# Patient Record
Sex: Female | Born: 1975 | Race: Black or African American | Hispanic: No | Marital: Single | Smoking: Never smoker
Health system: Southern US, Community
[De-identification: ages and names within clinical notes are randomized; demographics above are authoritative.]

## PROBLEM LIST (undated history)

## (undated) DIAGNOSIS — IMO0002 Reserved for concepts with insufficient information to code with codable children: Secondary | ICD-10-CM

## (undated) DIAGNOSIS — A749 Chlamydial infection, unspecified: Secondary | ICD-10-CM

## (undated) DIAGNOSIS — I10 Essential (primary) hypertension: Secondary | ICD-10-CM

## (undated) DIAGNOSIS — D219 Benign neoplasm of connective and other soft tissue, unspecified: Secondary | ICD-10-CM

## (undated) DIAGNOSIS — D649 Anemia, unspecified: Secondary | ICD-10-CM

## (undated) DIAGNOSIS — N920 Excessive and frequent menstruation with regular cycle: Secondary | ICD-10-CM

## (undated) DIAGNOSIS — K219 Gastro-esophageal reflux disease without esophagitis: Secondary | ICD-10-CM

## (undated) HISTORY — DX: Reserved for concepts with insufficient information to code with codable children: IMO0002

## (undated) HISTORY — DX: Essential (primary) hypertension: I10

## (undated) HISTORY — PX: OTHER SURGICAL HISTORY: SHX169

---
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
## 1997-05-10 HISTORY — PX: DILATION AND CURETTAGE OF UTERUS: SHX78

## 1997-12-12 ENCOUNTER — Inpatient Hospital Stay (HOSPITAL_COMMUNITY): Admission: AD | Admit: 1997-12-12 | Discharge: 1997-12-12 | Payer: Self-pay | Admitting: Obstetrics and Gynecology

## 1998-01-10 ENCOUNTER — Inpatient Hospital Stay (HOSPITAL_COMMUNITY): Admission: AD | Admit: 1998-01-10 | Discharge: 1998-01-13 | Payer: Self-pay | Admitting: Obstetrics and Gynecology

## 1998-01-18 ENCOUNTER — Emergency Department (HOSPITAL_COMMUNITY): Admission: EM | Admit: 1998-01-18 | Discharge: 1998-01-18 | Payer: Self-pay | Admitting: Emergency Medicine

## 1998-02-14 ENCOUNTER — Emergency Department (HOSPITAL_COMMUNITY): Admission: EM | Admit: 1998-02-14 | Discharge: 1998-02-14 | Payer: Self-pay | Admitting: Emergency Medicine

## 1998-02-22 ENCOUNTER — Emergency Department (HOSPITAL_COMMUNITY): Admission: EM | Admit: 1998-02-22 | Discharge: 1998-02-22 | Payer: Self-pay | Admitting: Emergency Medicine

## 1998-02-26 ENCOUNTER — Other Ambulatory Visit: Admission: RE | Admit: 1998-02-26 | Discharge: 1998-02-26 | Payer: Self-pay | Admitting: Obstetrics and Gynecology

## 1998-10-12 ENCOUNTER — Emergency Department (HOSPITAL_COMMUNITY): Admission: EM | Admit: 1998-10-12 | Discharge: 1998-10-13 | Payer: Self-pay | Admitting: Emergency Medicine

## 1999-01-17 ENCOUNTER — Emergency Department (HOSPITAL_COMMUNITY): Admission: EM | Admit: 1999-01-17 | Discharge: 1999-01-17 | Payer: Self-pay | Admitting: Emergency Medicine

## 1999-03-18 ENCOUNTER — Inpatient Hospital Stay (HOSPITAL_COMMUNITY): Admission: AD | Admit: 1999-03-18 | Discharge: 1999-03-18 | Payer: Self-pay | Admitting: Obstetrics

## 1999-08-31 ENCOUNTER — Emergency Department (HOSPITAL_COMMUNITY): Admission: EM | Admit: 1999-08-31 | Discharge: 1999-08-31 | Payer: Self-pay | Admitting: Emergency Medicine

## 1999-10-21 ENCOUNTER — Other Ambulatory Visit: Admission: RE | Admit: 1999-10-21 | Discharge: 1999-10-21 | Payer: Self-pay | Admitting: Obstetrics and Gynecology

## 1999-10-21 ENCOUNTER — Other Ambulatory Visit: Admission: RE | Admit: 1999-10-21 | Discharge: 1999-10-21 | Payer: Self-pay | Admitting: Unknown Physician Specialty

## 2000-04-09 ENCOUNTER — Inpatient Hospital Stay (HOSPITAL_COMMUNITY): Admission: AD | Admit: 2000-04-09 | Discharge: 2000-04-11 | Payer: Self-pay | Admitting: Obstetrics & Gynecology

## 2000-11-16 ENCOUNTER — Emergency Department (HOSPITAL_COMMUNITY): Admission: EM | Admit: 2000-11-16 | Discharge: 2000-11-16 | Payer: Self-pay | Admitting: Emergency Medicine

## 2000-11-18 ENCOUNTER — Emergency Department (HOSPITAL_COMMUNITY): Admission: EM | Admit: 2000-11-18 | Discharge: 2000-11-19 | Payer: Self-pay | Admitting: Emergency Medicine

## 2001-05-08 ENCOUNTER — Encounter: Admission: RE | Admit: 2001-05-08 | Discharge: 2001-05-08 | Payer: Self-pay

## 2001-10-05 ENCOUNTER — Encounter: Admission: RE | Admit: 2001-10-05 | Discharge: 2001-10-05 | Payer: Self-pay | Admitting: Internal Medicine

## 2002-02-14 ENCOUNTER — Encounter: Admission: RE | Admit: 2002-02-14 | Discharge: 2002-02-14 | Payer: Self-pay | Admitting: Internal Medicine

## 2003-03-29 ENCOUNTER — Encounter: Admission: RE | Admit: 2003-03-29 | Discharge: 2003-03-29 | Payer: Self-pay | Admitting: Internal Medicine

## 2004-08-22 ENCOUNTER — Emergency Department (HOSPITAL_COMMUNITY): Admission: EM | Admit: 2004-08-22 | Discharge: 2004-08-22 | Payer: Self-pay | Admitting: Emergency Medicine

## 2004-08-23 ENCOUNTER — Emergency Department (HOSPITAL_COMMUNITY): Admission: AD | Admit: 2004-08-23 | Discharge: 2004-08-23 | Payer: Self-pay | Admitting: Family Medicine

## 2005-10-29 ENCOUNTER — Ambulatory Visit (HOSPITAL_COMMUNITY): Admission: RE | Admit: 2005-10-29 | Discharge: 2005-10-29 | Payer: Self-pay | Admitting: Family Medicine

## 2008-05-21 ENCOUNTER — Emergency Department (HOSPITAL_BASED_OUTPATIENT_CLINIC_OR_DEPARTMENT_OTHER): Admission: EM | Admit: 2008-05-21 | Discharge: 2008-05-21 | Payer: Self-pay | Admitting: Emergency Medicine

## 2009-09-28 ENCOUNTER — Emergency Department (HOSPITAL_BASED_OUTPATIENT_CLINIC_OR_DEPARTMENT_OTHER): Admission: EM | Admit: 2009-09-28 | Discharge: 2009-09-28 | Payer: Self-pay | Admitting: Emergency Medicine

## 2010-05-10 HISTORY — PX: EPIDURAL BLOCK INJECTION: SHX1516

## 2010-05-10 NOTE — L&D Delivery Note (Signed)
Delivery Note Pt received epidural and progressed rapidly to complete dilation.  She pushed well about 10 minutes and at 1:13 PM a healthy female was delivered via Vaginal, Spontaneous Delivery (Presentation: Left Occiput Anterior).  APGAR: 8, 9; weight 7 lb 11.8 oz (3510 g).   Placenta status: Intact, Spontaneous.  Cord: 3 vessels with the following complications: True knot, corporal cord x 1. Anesthesia: Epidural  Episiotomy: none Lacerations: small 2nd degree Suture Repair: 3.0 vicryl rapide Est. Blood Loss (mL): 400cc  Mom to postpartum.  Baby to nursery-stable.  Oliver Pila 03/03/2011, 1:42 PM

## 2010-07-02 ENCOUNTER — Inpatient Hospital Stay (HOSPITAL_COMMUNITY): Payer: Self-pay

## 2010-07-02 ENCOUNTER — Inpatient Hospital Stay (HOSPITAL_COMMUNITY)
Admission: AD | Admit: 2010-07-02 | Discharge: 2010-07-02 | Disposition: A | Discharge status: Home / Self Care | Payer: Self-pay | Source: Ambulatory Visit | Attending: Obstetrics & Gynecology | Admitting: Obstetrics & Gynecology

## 2010-07-02 DIAGNOSIS — O3410 Maternal care for benign tumor of corpus uteri, unspecified trimester: Secondary | ICD-10-CM | POA: Insufficient documentation

## 2010-07-02 DIAGNOSIS — N760 Acute vaginitis: Secondary | ICD-10-CM

## 2010-07-02 DIAGNOSIS — A499 Bacterial infection, unspecified: Secondary | ICD-10-CM | POA: Insufficient documentation

## 2010-07-02 DIAGNOSIS — O2390 Unspecified genitourinary tract infection in pregnancy, unspecified trimester: Secondary | ICD-10-CM

## 2010-07-02 DIAGNOSIS — D259 Leiomyoma of uterus, unspecified: Secondary | ICD-10-CM | POA: Insufficient documentation

## 2010-07-02 DIAGNOSIS — R109 Unspecified abdominal pain: Secondary | ICD-10-CM | POA: Insufficient documentation

## 2010-07-02 DIAGNOSIS — B9689 Other specified bacterial agents as the cause of diseases classified elsewhere: Secondary | ICD-10-CM | POA: Insufficient documentation

## 2010-07-02 LAB — URINALYSIS, ROUTINE W REFLEX MICROSCOPIC
Bilirubin Urine: NEGATIVE
Nitrite: NEGATIVE
Specific Gravity, Urine: 1.025 (ref 1.005–1.030)
Urine Glucose, Fasting: NEGATIVE mg/dL
Urobilinogen, UA: 1 mg/dL (ref 0.0–1.0)

## 2010-07-02 LAB — CBC
HCT: 38.5 % (ref 36.0–46.0)
MCH: 29 pg (ref 26.0–34.0)
MCHC: 33.2 g/dL (ref 30.0–36.0)
MCV: 87.3 fL (ref 78.0–100.0)
Platelets: 220 10*3/uL (ref 150–400)
RBC: 4.41 MIL/uL (ref 3.87–5.11)

## 2010-07-02 LAB — WET PREP, GENITAL: Trich, Wet Prep: NONE SEEN

## 2010-07-02 LAB — POCT PREGNANCY, URINE: Preg Test, Ur: POSITIVE

## 2010-07-03 LAB — GC/CHLAMYDIA PROBE AMP, GENITAL: GC Probe Amp, Genital: NEGATIVE

## 2010-07-05 ENCOUNTER — Inpatient Hospital Stay (HOSPITAL_COMMUNITY)
Admission: AD | Admit: 2010-07-05 | Discharge: 2010-07-05 | Disposition: A | Discharge status: Home / Self Care | Payer: Self-pay | Source: Ambulatory Visit | Attending: Obstetrics and Gynecology | Admitting: Obstetrics and Gynecology

## 2010-07-05 DIAGNOSIS — O99891 Other specified diseases and conditions complicating pregnancy: Secondary | ICD-10-CM | POA: Insufficient documentation

## 2010-07-05 DIAGNOSIS — R109 Unspecified abdominal pain: Secondary | ICD-10-CM

## 2010-07-05 DIAGNOSIS — O9989 Other specified diseases and conditions complicating pregnancy, childbirth and the puerperium: Secondary | ICD-10-CM

## 2010-07-05 LAB — HCG, QUANTITATIVE, PREGNANCY: hCG, Beta Chain, Quant, S: 1862 m[IU]/mL — ABNORMAL HIGH (ref <5)

## 2010-07-10 ENCOUNTER — Ambulatory Visit (HOSPITAL_COMMUNITY)
Admission: RE | Admit: 2010-07-10 | Discharge: 2010-07-10 | Disposition: A | Discharge status: Home / Self Care | Payer: Medicaid Other | Source: Ambulatory Visit | Attending: Obstetrics and Gynecology | Admitting: Obstetrics and Gynecology

## 2010-07-10 ENCOUNTER — Inpatient Hospital Stay (HOSPITAL_COMMUNITY)
Admission: AD | Admit: 2010-07-10 | Discharge: 2010-07-10 | Disposition: A | Discharge status: Home / Self Care | Payer: Medicaid Other | Source: Ambulatory Visit | Attending: Obstetrics & Gynecology | Admitting: Obstetrics & Gynecology

## 2010-07-10 ENCOUNTER — Encounter (HOSPITAL_COMMUNITY): Payer: Self-pay

## 2010-07-10 DIAGNOSIS — O3410 Maternal care for benign tumor of corpus uteri, unspecified trimester: Secondary | ICD-10-CM | POA: Insufficient documentation

## 2010-07-10 DIAGNOSIS — O99891 Other specified diseases and conditions complicating pregnancy: Secondary | ICD-10-CM | POA: Insufficient documentation

## 2010-07-10 DIAGNOSIS — Z3689 Encounter for other specified antenatal screening: Secondary | ICD-10-CM | POA: Insufficient documentation

## 2010-07-27 LAB — RAPID STREP SCREEN (MED CTR MEBANE ONLY): Streptococcus, Group A Screen (Direct): NEGATIVE

## 2010-08-24 LAB — URINALYSIS, ROUTINE W REFLEX MICROSCOPIC
Bilirubin Urine: NEGATIVE
Glucose, UA: NEGATIVE mg/dL
Glucose, UA: NEGATIVE mg/dL
Hgb urine dipstick: NEGATIVE
Ketones, ur: 15 mg/dL — AB
Leukocytes, UA: NEGATIVE
Nitrite: POSITIVE — AB
Nitrite: POSITIVE — AB
Protein, ur: NEGATIVE mg/dL
Protein, ur: NEGATIVE mg/dL
Specific Gravity, Urine: 1.027 (ref 1.005–1.030)
Urobilinogen, UA: 1 mg/dL (ref 0.0–1.0)
pH: 6 (ref 5.0–8.0)

## 2010-08-24 LAB — TYPE AND SCREEN: Antibody Screen: NEGATIVE

## 2010-08-24 LAB — URINE MICROSCOPIC-ADD ON

## 2010-08-24 LAB — GC/CHLAMYDIA PROBE AMP, GENITAL
Chlamydia, DNA Probe: NEGATIVE
GC Probe Amp, Genital: NEGATIVE

## 2010-08-24 LAB — HEPATITIS B SURFACE ANTIGEN: Hepatitis B Surface Ag: NEGATIVE

## 2010-08-24 LAB — WET PREP, GENITAL: Trich, Wet Prep: NONE SEEN

## 2010-08-24 LAB — URINE CULTURE
Colony Count: NO GROWTH
Culture: NO GROWTH

## 2010-08-24 LAB — RUBELLA ANTIBODY, IGM: Rubella: IMMUNE

## 2010-08-24 LAB — PREGNANCY, URINE: Preg Test, Ur: NEGATIVE

## 2010-10-14 ENCOUNTER — Other Ambulatory Visit (HOSPITAL_COMMUNITY): Payer: Self-pay | Admitting: Obstetrics and Gynecology

## 2010-10-14 DIAGNOSIS — O2690 Pregnancy related conditions, unspecified, unspecified trimester: Secondary | ICD-10-CM

## 2010-10-14 DIAGNOSIS — Z0489 Encounter for examination and observation for other specified reasons: Secondary | ICD-10-CM

## 2010-10-21 ENCOUNTER — Other Ambulatory Visit (HOSPITAL_COMMUNITY): Payer: Self-pay | Admitting: Obstetrics and Gynecology

## 2010-10-21 ENCOUNTER — Ambulatory Visit (HOSPITAL_COMMUNITY)
Admission: RE | Admit: 2010-10-21 | Discharge: 2010-10-21 | Disposition: A | Discharge status: Home / Self Care | Payer: Medicaid Other | Source: Ambulatory Visit | Attending: Obstetrics and Gynecology | Admitting: Obstetrics and Gynecology

## 2010-10-21 DIAGNOSIS — Z0489 Encounter for examination and observation for other specified reasons: Secondary | ICD-10-CM

## 2010-10-21 DIAGNOSIS — O358XX0 Maternal care for other (suspected) fetal abnormality and damage, not applicable or unspecified: Secondary | ICD-10-CM | POA: Insufficient documentation

## 2010-10-21 DIAGNOSIS — O3410 Maternal care for benign tumor of corpus uteri, unspecified trimester: Secondary | ICD-10-CM | POA: Insufficient documentation

## 2010-10-21 DIAGNOSIS — Z1389 Encounter for screening for other disorder: Secondary | ICD-10-CM | POA: Insufficient documentation

## 2010-10-21 DIAGNOSIS — N1330 Unspecified hydronephrosis: Secondary | ICD-10-CM

## 2010-10-21 DIAGNOSIS — O2690 Pregnancy related conditions, unspecified, unspecified trimester: Secondary | ICD-10-CM

## 2010-10-21 DIAGNOSIS — Z363 Encounter for antenatal screening for malformations: Secondary | ICD-10-CM | POA: Insufficient documentation

## 2010-11-02 ENCOUNTER — Inpatient Hospital Stay (HOSPITAL_COMMUNITY)
Admission: EM | Admit: 2010-11-02 | Discharge: 2010-11-02 | Disposition: A | Discharge status: Home / Self Care | Payer: Medicaid Other | Source: Ambulatory Visit | Attending: Obstetrics and Gynecology | Admitting: Obstetrics and Gynecology

## 2010-11-02 DIAGNOSIS — O2390 Unspecified genitourinary tract infection in pregnancy, unspecified trimester: Secondary | ICD-10-CM

## 2010-11-02 DIAGNOSIS — O4700 False labor before 37 completed weeks of gestation, unspecified trimester: Secondary | ICD-10-CM | POA: Insufficient documentation

## 2010-11-02 DIAGNOSIS — N760 Acute vaginitis: Secondary | ICD-10-CM

## 2010-11-02 DIAGNOSIS — A499 Bacterial infection, unspecified: Secondary | ICD-10-CM

## 2010-11-02 DIAGNOSIS — B9689 Other specified bacterial agents as the cause of diseases classified elsewhere: Secondary | ICD-10-CM | POA: Insufficient documentation

## 2010-11-02 LAB — URINALYSIS, ROUTINE W REFLEX MICROSCOPIC
Glucose, UA: NEGATIVE mg/dL
Ketones, ur: NEGATIVE mg/dL
Leukocytes, UA: NEGATIVE
pH: 7 (ref 5.0–8.0)

## 2010-11-02 LAB — WET PREP, GENITAL: Yeast Wet Prep HPF POC: NONE SEEN

## 2010-12-08 ENCOUNTER — Inpatient Hospital Stay (HOSPITAL_COMMUNITY): Payer: Medicaid Other

## 2010-12-08 ENCOUNTER — Encounter (HOSPITAL_COMMUNITY): Payer: Self-pay

## 2010-12-08 ENCOUNTER — Inpatient Hospital Stay (HOSPITAL_COMMUNITY)
Admission: EM | Admit: 2010-12-08 | Discharge: 2010-12-08 | Disposition: A | Discharge status: Home / Self Care | Payer: Medicaid Other | Source: Ambulatory Visit | Attending: Obstetrics and Gynecology | Admitting: Obstetrics and Gynecology

## 2010-12-08 ENCOUNTER — Other Ambulatory Visit: Payer: Self-pay | Admitting: Obstetrics and Gynecology

## 2010-12-08 DIAGNOSIS — O4700 False labor before 37 completed weeks of gestation, unspecified trimester: Secondary | ICD-10-CM | POA: Insufficient documentation

## 2010-12-08 DIAGNOSIS — O479 False labor, unspecified: Secondary | ICD-10-CM

## 2010-12-08 HISTORY — DX: Anemia, unspecified: D64.9

## 2010-12-08 HISTORY — DX: Benign neoplasm of connective and other soft tissue, unspecified: D21.9

## 2010-12-08 HISTORY — DX: Chlamydial infection, unspecified: A74.9

## 2010-12-08 LAB — WET PREP, GENITAL
Clue Cells Wet Prep HPF POC: NONE SEEN
Trich, Wet Prep: NONE SEEN
Yeast Wet Prep HPF POC: NONE SEEN

## 2010-12-08 LAB — URINALYSIS, ROUTINE W REFLEX MICROSCOPIC
Leukocytes, UA: NEGATIVE
Nitrite: NEGATIVE
Protein, ur: NEGATIVE mg/dL
Urobilinogen, UA: 1 mg/dL (ref 0.0–1.0)

## 2010-12-08 LAB — FETAL FIBRONECTIN: Fetal Fibronectin: NEGATIVE

## 2010-12-08 MED ORDER — LACTATED RINGERS IV SOLN
INTRAVENOUS | Status: DC
  Administered 2010-12-08: 20:00:00 via INTRAVENOUS

## 2010-12-08 MED ORDER — NIFEDIPINE 10 MG PO CAPS
10.0000 mg | ORAL_CAPSULE | ORAL | Status: DC | PRN
  Administered 2010-12-08 (×2): 10 mg via ORAL
  Filled 2010-12-08 (×2): qty 1

## 2010-12-08 MED ORDER — NIFEDIPINE 10 MG PO CAPS: 20.0000 mg | ORAL_CAPSULE | Freq: Four times a day (QID) | ORAL | Status: DC | PRN

## 2010-12-08 MED ORDER — NIFEDIPINE 10 MG PO CAPS
20.0000 mg | ORAL_CAPSULE | Freq: Once | ORAL | Status: AC
  Administered 2010-12-08: 20 mg via ORAL

## 2010-12-08 MED ORDER — LACTATED RINGERS IV BOLUS (SEPSIS): 250.0000 mL | Freq: Once | INTRAVENOUS | Status: DC

## 2010-12-08 MED ORDER — LACTATED RINGERS IV BOLUS (SEPSIS)
1000.0000 mL | Freq: Once | INTRAVENOUS | Status: AC
  Administered 2010-12-08: 1000 mL via INTRAVENOUS

## 2010-12-08 MED ORDER — NIFEDIPINE 10 MG PO CAPS
ORAL_CAPSULE | ORAL | Status: AC
  Administered 2010-12-08: 10 mg via ORAL
  Filled 2010-12-08: qty 2

## 2010-12-08 NOTE — ED Provider Notes (Signed)
Teresa Hudson is a 35 y.o. female at 27.[redacted] weeks gestation presenting for preterm UC's since yesterday. She reports + FM and denies vaginal bleeding or leaking of fluid.  Maternal Medical History:  Reason for admission: Reason for admission: contractions.  Contractions: Onset was yesterday.   Frequency: regular.   Duration is approximately 60 seconds.   Perceived severity is moderate.    Fetal activity: Perceived fetal activity is normal.   Last perceived fetal movement was within the past hour.    Prenatal Complications - Diabetes: none.    OB History    Grav Para Term Preterm Abortions TAB SAB Ect Mult Living   5 2 2  2 2    2      Past Medical History  Diagnosis Date  . Chlamydia   . Fibroids   . Anemia    Past Surgical History  Procedure Date  . Dilation and curettage of uterus 1999   Family History: family history includes Asthma in her sister; Cancer in her father and mother; Heart disease in her mother; and Hypertension in her father. Social History:  does not have a smoking history on file. She does not have any smokeless tobacco history on file. Her alcohol and drug histories not on file.  Review of Systems  All other systems reviewed and are negative.    Dilation: Fingertip Effacement (%): Thick Exam by:: virginia smith cnm Blood pressure 118/77, pulse 107, temperature 99.2 F (37.3 C), temperature source Oral, resp. rate 16, height 5\' 4"  (1.626 m), weight 87.544 kg (193 lb), last menstrual period 05/31/2010. Maternal Exam:  Uterine Assessment: Contraction strength is moderate.  Contraction frequency is regular.  Q3-5  Abdomen: Patient reports no abdominal tenderness. Fundal height is S=D.    Introitus: Normal vulva. Normal vagina.  Pelvis: adequate for delivery.   Cervix: Cervix evaluated by sterile speculum exam and digital exam.     Fetal Exam Fetal Monitor Review: Mode: ultrasound.   Baseline rate: 130-140.  Variability: moderate (6-25 bpm).     Pattern: accelerations present and no decelerations.    Fetal State Assessment: Category I - tracings are normal.    UC's Q3-7 Dilation: Fingertip Effacement (%): Thick Cervical Position: Posterior Exam by:: virginia smith cnm  Physical Exam  Constitutional: She appears well-developed and well-nourished.  Cardiovascular: Normal rate.   GI: Soft.  Genitourinary: Vagina normal and uterus normal.    Prenatal labs: ABO, Rh: A POS (02/23 2010) Antibody: Negative (04/16 0000) Rubella:   RPR: Nonreactive (04/16 0000)  HBsAg: Negative (04/16 0000)  HIV: Non-reactive (04/16 0000)  GBS:     Assessment/Plan: Assessment: 1. Preterm UC's w/out cervical change 2. FHR category I  Plan: 1. LR bolus, Procardia, fFN per consult w/ Dr. Cheyenne Adas, [place] 12/08/2010, 6:21 PM

## 2010-12-08 NOTE — Progress Notes (Signed)
Pt states was having ctx's a lot yesterday, now have eased off, here for NST, FFN, u/a collected. +FM. Denies bleeding or vaginal d/c changes. Hx BV.

## 2010-12-08 NOTE — Progress Notes (Signed)
pateint states that she started having contractions and sharp intermittent pelvic pain last night. She states that her doctor told her to come her because they couldn't see her in the office. She reports good fetal movement. Denies any vaginal bleeding, lof or discharge.

## 2010-12-08 NOTE — ED Provider Notes (Signed)
Teresa Hudson [birth year] BPP 8/8. No further decels. Still uncomfortable w/ UC's, but ready to go home UA: neg  D/C home per consult w/ Dr. Ellyn Hack  Rx procardia 20 mg PO Q6 PRN PTL precautions

## 2010-12-08 NOTE — ED Provider Notes (Signed)
Teresa Hudson 1975/10/21 UC's improving significantly after 2 doses of Procardia. Possible decel down to 70--90's x ~4 min, resolved w/ position change, O2, IV bolus, now category II  BPP per consult w/ Dr, Ellyn Hack.

## 2010-12-16 ENCOUNTER — Ambulatory Visit (HOSPITAL_COMMUNITY)
Admission: RE | Admit: 2010-12-16 | Discharge: 2010-12-16 | Disposition: A | Discharge status: Home / Self Care | Payer: Medicaid Other | Source: Ambulatory Visit | Attending: Obstetrics and Gynecology | Admitting: Obstetrics and Gynecology

## 2010-12-16 ENCOUNTER — Other Ambulatory Visit (HOSPITAL_COMMUNITY): Payer: Self-pay | Admitting: Obstetrics and Gynecology

## 2010-12-16 DIAGNOSIS — O3410 Maternal care for benign tumor of corpus uteri, unspecified trimester: Secondary | ICD-10-CM | POA: Insufficient documentation

## 2010-12-16 DIAGNOSIS — O358XX0 Maternal care for other (suspected) fetal abnormality and damage, not applicable or unspecified: Secondary | ICD-10-CM | POA: Insufficient documentation

## 2010-12-16 DIAGNOSIS — N1330 Unspecified hydronephrosis: Secondary | ICD-10-CM

## 2010-12-16 NOTE — Progress Notes (Signed)
Patient seen for ultrasound only appointment today.  Please see [facility]-OBGYN report for details.  

## 2011-01-13 ENCOUNTER — Ambulatory Visit (HOSPITAL_COMMUNITY)
Admission: RE | Admit: 2011-01-13 | Discharge: 2011-01-13 | Disposition: A | Discharge status: Home / Self Care | Payer: Medicaid Other | Source: Ambulatory Visit | Attending: Obstetrics and Gynecology | Admitting: Obstetrics and Gynecology

## 2011-01-13 DIAGNOSIS — N1330 Unspecified hydronephrosis: Secondary | ICD-10-CM

## 2011-01-13 DIAGNOSIS — O358XX0 Maternal care for other (suspected) fetal abnormality and damage, not applicable or unspecified: Secondary | ICD-10-CM | POA: Insufficient documentation

## 2011-01-13 DIAGNOSIS — O3410 Maternal care for benign tumor of corpus uteri, unspecified trimester: Secondary | ICD-10-CM | POA: Insufficient documentation

## 2011-02-23 ENCOUNTER — Telehealth (HOSPITAL_COMMUNITY): Payer: Self-pay | Admitting: *Deleted

## 2011-02-23 ENCOUNTER — Encounter (HOSPITAL_COMMUNITY): Payer: Self-pay | Admitting: *Deleted

## 2011-02-23 NOTE — Telephone Encounter (Signed)
Preadmission screen  

## 2011-03-02 ENCOUNTER — Other Ambulatory Visit: Payer: Self-pay | Admitting: Obstetrics and Gynecology

## 2011-03-03 ENCOUNTER — Inpatient Hospital Stay (HOSPITAL_COMMUNITY)
Admission: RE | Admit: 2011-03-03 | Discharge: 2011-03-05 | DRG: 775 | Disposition: A | Discharge status: Home / Self Care | Payer: Medicaid Other | Source: Ambulatory Visit | Attending: Obstetrics and Gynecology | Admitting: Obstetrics and Gynecology

## 2011-03-03 ENCOUNTER — Encounter (HOSPITAL_COMMUNITY): Payer: Self-pay | Admitting: Anesthesiology

## 2011-03-03 ENCOUNTER — Inpatient Hospital Stay (HOSPITAL_COMMUNITY): Payer: Medicaid Other | Admitting: Anesthesiology

## 2011-03-03 ENCOUNTER — Encounter (HOSPITAL_COMMUNITY): Payer: Self-pay

## 2011-03-03 DIAGNOSIS — D4959 Neoplasm of unspecified behavior of other genitourinary organ: Secondary | ICD-10-CM | POA: Diagnosis present

## 2011-03-03 DIAGNOSIS — O34599 Maternal care for other abnormalities of gravid uterus, unspecified trimester: Secondary | ICD-10-CM | POA: Diagnosis present

## 2011-03-03 DIAGNOSIS — O139 Gestational [pregnancy-induced] hypertension without significant proteinuria, unspecified trimester: Principal | ICD-10-CM | POA: Diagnosis present

## 2011-03-03 DIAGNOSIS — D259 Leiomyoma of uterus, unspecified: Secondary | ICD-10-CM | POA: Diagnosis present

## 2011-03-03 DIAGNOSIS — O358XX0 Maternal care for other (suspected) fetal abnormality and damage, not applicable or unspecified: Secondary | ICD-10-CM | POA: Diagnosis present

## 2011-03-03 LAB — URIC ACID: Uric Acid, Serum: 4.4 mg/dL (ref 2.4–7.0)

## 2011-03-03 LAB — COMPREHENSIVE METABOLIC PANEL
ALT: 13 U/L (ref 0–35)
AST: 25 U/L (ref 0–37)
Albumin: 2.7 g/dL — ABNORMAL LOW (ref 3.5–5.2)
Alkaline Phosphatase: 156 U/L — ABNORMAL HIGH (ref 39–117)
Chloride: 104 mEq/L (ref 96–112)
Potassium: 4.1 mEq/L (ref 3.5–5.1)
Total Bilirubin: 0.6 mg/dL (ref 0.3–1.2)

## 2011-03-03 LAB — CBC
Platelets: 193 10*3/uL (ref 150–400)
RBC: 3.99 MIL/uL (ref 3.87–5.11)
WBC: 9.9 10*3/uL (ref 4.0–10.5)

## 2011-03-03 LAB — SYPHILIS: RPR W/REFLEX TO RPR TITER AND TREPONEMAL ANTIBODIES, TRADITIONAL SCREENING AND DIAGNOSIS ALGORITHM: RPR Ser Ql: NONREACTIVE

## 2011-03-03 MED ORDER — LACTATED RINGERS IV SOLN: 500.0000 mL | INTRAVENOUS | Status: DC | PRN

## 2011-03-03 MED ORDER — OXYTOCIN BOLUS FROM INFUSION
500.0000 mL | Freq: Once | INTRAVENOUS | Status: DC
  Filled 2011-03-03: qty 500

## 2011-03-03 MED ORDER — PRENATAL PLUS 27-1 MG PO TABS
1.0000 | ORAL_TABLET | Freq: Every day | ORAL | Status: DC
  Administered 2011-03-04: 1 via ORAL
  Filled 2011-03-03: qty 1

## 2011-03-03 MED ORDER — ONDANSETRON HCL 4 MG/2ML IJ SOLN: 4.0000 mg | Freq: Four times a day (QID) | INTRAMUSCULAR | Status: DC | PRN

## 2011-03-03 MED ORDER — ONDANSETRON HCL 4 MG PO TABS: 4.0000 mg | ORAL_TABLET | ORAL | Status: DC | PRN

## 2011-03-03 MED ORDER — FENTANYL 2.5 MCG/ML BUPIVACAINE 1/10 % EPIDURAL INFUSION (WH - ANES)
14.0000 mL/h | INTRAMUSCULAR | Status: DC
  Administered 2011-03-03 (×2): 14 mL/h via EPIDURAL
  Filled 2011-03-03 (×3): qty 60

## 2011-03-03 MED ORDER — EPHEDRINE 5 MG/ML INJ
10.0000 mg | INTRAVENOUS | Status: DC | PRN
  Filled 2011-03-03: qty 4

## 2011-03-03 MED ORDER — LANOLIN HYDROUS EX OINT: TOPICAL_OINTMENT | CUTANEOUS | Status: DC | PRN

## 2011-03-03 MED ORDER — ACETAMINOPHEN 325 MG PO TABS: 650.0000 mg | ORAL_TABLET | ORAL | Status: DC | PRN

## 2011-03-03 MED ORDER — CITRIC ACID-SODIUM CITRATE 334-500 MG/5ML PO SOLN: 30.0000 mL | ORAL | Status: DC | PRN

## 2011-03-03 MED ORDER — FLEET ENEMA 7-19 GM/118ML RE ENEM: 1.0000 | ENEMA | RECTAL | Status: DC | PRN

## 2011-03-03 MED ORDER — OXYCODONE-ACETAMINOPHEN 5-325 MG PO TABS: 2.0000 | ORAL_TABLET | ORAL | Status: DC | PRN

## 2011-03-03 MED ORDER — SENNOSIDES-DOCUSATE SODIUM 8.6-50 MG PO TABS
2.0000 | ORAL_TABLET | Freq: Every day | ORAL | Status: DC
  Administered 2011-03-03 – 2011-03-04 (×2): 2 via ORAL

## 2011-03-03 MED ORDER — IBUPROFEN 600 MG PO TABS: 600.0000 mg | ORAL_TABLET | Freq: Four times a day (QID) | ORAL | Status: DC | PRN

## 2011-03-03 MED ORDER — PHENYLEPHRINE 40 MCG/ML (10ML) SYRINGE FOR IV PUSH (FOR BLOOD PRESSURE SUPPORT)
80.0000 ug | PREFILLED_SYRINGE | INTRAVENOUS | Status: DC | PRN
  Filled 2011-03-03: qty 5

## 2011-03-03 MED ORDER — OXYTOCIN 20 UNITS IN LACTATED RINGERS INFUSION - SIMPLE: 125.0000 mL/h | Freq: Once | INTRAVENOUS | Status: DC

## 2011-03-03 MED ORDER — SIMETHICONE 80 MG PO CHEW
80.0000 mg | CHEWABLE_TABLET | ORAL | Status: DC | PRN
  Administered 2011-03-04 – 2011-03-05 (×2): 80 mg via ORAL

## 2011-03-03 MED ORDER — LIDOCAINE HCL (PF) 1 % IJ SOLN
30.0000 mL | INTRAMUSCULAR | Status: DC | PRN
  Filled 2011-03-03 (×3): qty 30

## 2011-03-03 MED ORDER — OXYCODONE-ACETAMINOPHEN 5-325 MG PO TABS
1.0000 | ORAL_TABLET | ORAL | Status: DC | PRN
  Administered 2011-03-03: 1 via ORAL
  Administered 2011-03-04: 2 via ORAL
  Administered 2011-03-04: 1 via ORAL
  Administered 2011-03-04 (×2): 2 via ORAL
  Administered 2011-03-04 – 2011-03-05 (×2): 1 via ORAL
  Administered 2011-03-05: 2 via ORAL
  Administered 2011-03-05: 1 via ORAL
  Filled 2011-03-03: qty 2
  Filled 2011-03-03: qty 1
  Filled 2011-03-03 (×3): qty 2
  Filled 2011-03-03 (×3): qty 1

## 2011-03-03 MED ORDER — LIDOCAINE HCL 1.5 % IJ SOLN
INTRAMUSCULAR | Status: DC | PRN
  Administered 2011-03-03 (×2): 5 mL via INTRADERMAL

## 2011-03-03 MED ORDER — DIPHENHYDRAMINE HCL 25 MG PO CAPS: 25.0000 mg | ORAL_CAPSULE | Freq: Four times a day (QID) | ORAL | Status: DC | PRN

## 2011-03-03 MED ORDER — BUPIVACAINE HCL (PF) 0.25 % IJ SOLN
INTRAMUSCULAR | Status: DC | PRN
  Administered 2011-03-03: 10 mL

## 2011-03-03 MED ORDER — OXYTOCIN 20 UNITS IN LACTATED RINGERS INFUSION - SIMPLE
1.0000 m[IU]/min | INTRAVENOUS | Status: DC
Start: 2011-03-03 — End: 2011-03-03
  Administered 2011-03-03: 333 m[IU]/min via INTRAVENOUS
  Administered 2011-03-03: 2 m[IU]/min via INTRAVENOUS
  Filled 2011-03-03: qty 1000

## 2011-03-03 MED ORDER — ZOLPIDEM TARTRATE 5 MG PO TABS: 5.0000 mg | ORAL_TABLET | Freq: Every evening | ORAL | Status: DC | PRN

## 2011-03-03 MED ORDER — WITCH HAZEL-GLYCERIN EX PADS: 1.0000 "application " | MEDICATED_PAD | CUTANEOUS | Status: DC | PRN

## 2011-03-03 MED ORDER — BENZOCAINE-MENTHOL 20-0.5 % EX AERO: 1.0000 "application " | INHALATION_SPRAY | CUTANEOUS | Status: DC | PRN

## 2011-03-03 MED ORDER — DIPHENHYDRAMINE HCL 50 MG/ML IJ SOLN: 12.5000 mg | INTRAMUSCULAR | Status: DC | PRN

## 2011-03-03 MED ORDER — TETANUS-DIPHTH-ACELL PERTUSSIS 5-2.5-18.5 LF-MCG/0.5 IM SUSP: 0.5000 mL | Freq: Once | INTRAMUSCULAR | Status: DC

## 2011-03-03 MED ORDER — METHYLERGONOVINE MALEATE 0.2 MG/ML IJ SOLN
0.2000 mg | Freq: Once | INTRAMUSCULAR | Status: AC
  Administered 2011-03-03: 0.2 mg via INTRAMUSCULAR
  Filled 2011-03-03: qty 1

## 2011-03-03 MED ORDER — LIDOCAINE HCL (PF) 1 % IJ SOLN: 30.0000 mL | INTRAMUSCULAR | Status: DC | PRN

## 2011-03-03 MED ORDER — BENZOCAINE-MENTHOL 20-0.5 % EX AERO
INHALATION_SPRAY | CUTANEOUS | Status: AC
  Filled 2011-03-03: qty 56

## 2011-03-03 MED ORDER — LACTATED RINGERS IV SOLN
INTRAVENOUS | Status: DC
  Administered 2011-03-03: 11:00:00 via INTRAVENOUS

## 2011-03-03 MED ORDER — OXYCODONE-ACETAMINOPHEN 5-325 MG PO TABS
2.0000 | ORAL_TABLET | ORAL | Status: DC | PRN
  Administered 2011-03-03: 1 via ORAL
  Filled 2011-03-03 (×2): qty 1

## 2011-03-03 MED ORDER — LACTATED RINGERS IV SOLN: 500.0000 mL | Freq: Once | INTRAVENOUS | Status: DC

## 2011-03-03 MED ORDER — PHENYLEPHRINE 40 MCG/ML (10ML) SYRINGE FOR IV PUSH (FOR BLOOD PRESSURE SUPPORT)
80.0000 ug | PREFILLED_SYRINGE | INTRAVENOUS | Status: DC | PRN
  Filled 2011-03-03 (×3): qty 5

## 2011-03-03 MED ORDER — ONDANSETRON HCL 4 MG/2ML IJ SOLN: 4.0000 mg | INTRAMUSCULAR | Status: DC | PRN

## 2011-03-03 MED ORDER — DIBUCAINE 1 % RE OINT: 1.0000 "application " | TOPICAL_OINTMENT | RECTAL | Status: DC | PRN

## 2011-03-03 MED ORDER — EPHEDRINE 5 MG/ML INJ
10.0000 mg | INTRAVENOUS | Status: DC | PRN
  Filled 2011-03-03 (×3): qty 4

## 2011-03-03 MED ORDER — TERBUTALINE SULFATE 1 MG/ML IJ SOLN: 0.2500 mg | Freq: Once | INTRAMUSCULAR | Status: DC | PRN

## 2011-03-03 MED ORDER — LACTATED RINGERS IV SOLN
INTRAVENOUS | Status: DC
  Administered 2011-03-03: 08:00:00 via INTRAVENOUS

## 2011-03-03 MED ORDER — OXYTOCIN 10 UNIT/ML IJ SOLN
INTRAMUSCULAR | Status: AC
  Filled 2011-03-03: qty 2

## 2011-03-03 MED ORDER — IBUPROFEN 600 MG PO TABS: 600.0000 mg | ORAL_TABLET | Freq: Four times a day (QID) | ORAL | Status: DC

## 2011-03-03 NOTE — Progress Notes (Signed)
03-03-11 1710  Patient's bleeding moderate to heavy. On initial check at 1615 fundus was U+2, at 1700 fundus is U+4. Assisted pt to the bathroom and she voided a small to mod. amount of urine. Pt was dizzy when assisted back to bed. Rechecked fundus and was still U+4, bleeding still moderate to heavy. Notified Dr. Senaida Ores on call. MD will be in to see pt. Lenon Oms, RN

## 2011-03-03 NOTE — H&P (Signed)
Teresa Hudson is a 35 y.o. female [id number] at 70 3/7 weeks (EDD 03/07/11 by LMP c/w 9 week [place])  presenting for induction at term with somenelevated BP noted over the last several weeks.  At her visits BP 150's/90-100 but labs WNL and no proteinuria.  BP usually improved with rest to 140/80's.  Followed closely with NST's and labwork.  Prenatal care significant for enlarged fibroid uterus, caused some pain early in pregnancy that resolved.  There was also left fetal pyelectasis which was followed by serial [place] and looked to have resolved on her 32 week [place].The pt desires a pp BTL with Essure.  History OB History    Grav Para Term Preterm Abortions TAB SAB Ect Mult Living   5 2 2  2 2    2     NSVD 1999 6#4oz NSVD 2001 7# EAB x 2  Past Medical History  Diagnosis Date  . Chlamydia   . Fibroids   . Anemia   . Fibroids    Past Surgical History  Procedure Date  . Dilation and curettage of uterus 1999   Family History: family history includes Asthma in her sister; Cancer in her father and mother; GI problems in her sister; Heart disease in her mother; Hypertension in her father; and Thyroid cancer in her sister. Social History:  does not have a smoking history on file. She does not have any smokeless tobacco history on file. Her alcohol and drug histories not on file.  Review of Systems  Eyes: Negative for blurred vision.      Blood pressure 147/97, pulse 81, temperature 98.1 F (36.7 C), temperature source Oral, resp. rate 20, last menstrual period 05/31/2010. Maternal Exam:  Uterine Assessment: Contraction strength is mild.  Contraction frequency is irregular.   Abdomen: Patient reports no abdominal tenderness. Fetal presentation: vertex  Introitus: Normal vulva. Normal vagina.    Physical Exam  Constitutional: She is oriented to person, place, and time. She appears well-developed.  Cardiovascular: Normal rate and regular rhythm.   Respiratory: Effort normal.  GI: Soft.    Genitourinary: Vagina normal and uterus normal.       Cervix 2/70/-2 in office  Neurological: She is alert and oriented to person, place, and time.  Psychiatric: She has a normal mood and affect. Her behavior is normal.    Prenatal labs: ABO, Rh: A/Positive/-- (04/16 0000) Antibody: Negative (04/16 0000) Rubella: Immune (04/16 0000) RPR: Nonreactive (04/16 0000)  HBsAg: Negative (04/16 0000)  HIV: Non-reactive (04/16 0000)  GBS: Negative (09/24 0000)  One hour glucola 127 Hgb AA First trimester screen negative AFP negative   Assessment/Plan: Pt for induction of labor at term for gestational hypertension.  Will recheck labs to confirm still WNL.  Plan pitocin and AROM.  Epidural prn.   Oliver Pila 03/03/2011, 7:51 AM

## 2011-03-03 NOTE — Progress Notes (Signed)
  CTSP for slightly excessive VB and FH up to 3+ umbilicus.  Bimanual with about 100cc clot removed from LUS.  WIll allow only 1 dose of methergine post-evacuation to hold uterus contracted.  BP diastolic at 90 off and on.

## 2011-03-03 NOTE — Progress Notes (Signed)
UR chart review completed.  

## 2011-03-03 NOTE — Anesthesia Procedure Notes (Signed)
Epidural Patient location during procedure: OB Start time: 03/03/2011 11:00 AM  Staffing Performed by: anesthesiologist   Preanesthetic Checklist Completed: patient identified, site marked, surgical consent, pre-op evaluation, timeout performed, IV checked, risks and benefits discussed and monitors and equipment checked  Epidural Patient position: sitting Prep: site prepped and draped and DuraPrep Patient monitoring: continuous pulse ox and blood pressure Approach: midline Injection technique: LOR air and LOR saline  Needle:  Needle type: Tuohy  Needle gauge: 17 G Needle length: 9 cm Needle insertion depth: 6 cm Catheter type: closed end flexible Catheter size: 19 Gauge Catheter at skin depth: 11 cm Test dose: negative  Assessment Events: blood not aspirated, injection not painful, no injection resistance, negative IV test and no paresthesia  Additional Notes Patient identified.  Risk benefits discussed including failed block, incomplete pain control, headache, nerve damage, paralysis, blood pressure changes, nausea, vomiting, reactions to medication both toxic or allergic, and postpartum back pain.  Patient expressed understanding and wished to proceed.  All questions were answered.  Sterile technique used throughout procedure and epidural site dressed with sterile barrier dressing. No paresthesia or other complications noted.The patient did not experience any signs of intravascular injection such as tinnitus or metallic taste in mouth nor signs of intrathecal spread such as rapid motor block. Please see nursing notes for vital signs.

## 2011-03-03 NOTE — Progress Notes (Signed)
   Subjective: No c/o, feels mild ctx  Objective: BP 139/89  Pulse 95  Temp(Src) 98.2 F (36.8 C) (Oral)  Resp 18  Ht 5\' 4"  (1.626 m)  Wt 97.07 kg (214 lb)  BMI 36.73 kg/m2  LMP 05/31/2010      FHT:  FHR: 135 bpm, variability: moderate,  accelerations:  Present,  decelerations:  Absent UC:   irregular, every 5 minutes SVE:   Dilation: 2.5 Effacement (%): 60 Station: -2 Exam by:: Dr. Senaida Ores AROM moderate meconium Labs: Lab Results  Component Value Date   WBC 9.9 03/03/2011   HGB 11.3* 03/03/2011   HCT 34.3* 03/03/2011   MCV 86.0 03/03/2011   PLT 193 03/03/2011    Assessment / Plan: Induction of labor due to gestational hypertension,  progressing well on pitocin, will continue to increase, epidural prn No PIH sx, labs WNL    Keanan Melander W 03/03/2011, 9:07 AM

## 2011-03-03 NOTE — Anesthesia Preprocedure Evaluation (Signed)
Anesthesia Evaluation  Patient identified by MRN, date of birth, ID band Patient awake  General Assessment Comment  Reviewed: Allergy & Precautions, H&P , Patient's Chart, lab work & pertinent test results  Airway Mallampati: III TM Distance: >3 FB Neck ROM: full    Dental No notable dental hx.    Pulmonary  clear to auscultation  Pulmonary exam normal       Cardiovascular regular Normal    Neuro/Psych Negative Neurological ROS  Negative Psych ROS   GI/Hepatic negative GI ROS Neg liver ROS    Endo/Other  Negative Endocrine ROSMorbid obesity  Renal/GU negative Renal ROS     Musculoskeletal   Abdominal   Peds  Hematology negative hematology ROS (+)   Anesthesia Other Findings   Reproductive/Obstetrics (+) Pregnancy                           Anesthesia Physical Anesthesia Plan  ASA: III  Anesthesia Plan: Epidural   Post-op Pain Management:    Induction:   Airway Management Planned:   Additional Equipment:   Intra-op Plan:   Post-operative Plan:   Informed Consent: I have reviewed the patients History and Physical, chart, labs and discussed the procedure including the risks, benefits and alternatives for the proposed anesthesia with the patient or authorized representative who has indicated his/her understanding and acceptance.     Plan Discussed with:   Anesthesia Plan Comments:         Anesthesia Quick Evaluation

## 2011-03-04 LAB — CBC
HCT: 32.6 % — ABNORMAL LOW (ref 36.0–46.0)
MCH: 27.4 pg (ref 26.0–34.0)
MCV: 86 fL (ref 78.0–100.0)
Platelets: 187 10*3/uL (ref 150–400)
RBC: 3.79 MIL/uL — ABNORMAL LOW (ref 3.87–5.11)
WBC: 15.7 10*3/uL — ABNORMAL HIGH (ref 4.0–10.5)

## 2011-03-04 NOTE — Anesthesia Postprocedure Evaluation (Signed)
  Anesthesia Post-op Note  Patient: Teresa Hudson  Procedure(s) Performed: * No procedures listed *  Patient Location: Mother/Baby  Anesthesia Type: Epidural  Level of Consciousness: awake  Airway and Oxygen Therapy: Patient Spontanous Breathing  Post-op Pain: none  Post-op Assessment: Patient's Cardiovascular Status Stable, Respiratory Function Stable, Adequate PO intake and Pain level controlled  Post-op Vital Signs: Reviewed and stable  Complications: No apparent anesthesia complications

## 2011-03-04 NOTE — Progress Notes (Signed)
Post Partum Day 1 Subjective: no complaints, bleeding much improved--feels better  Objective: Blood pressure 144/90, pulse 87, temperature 97.8 F (36.6 C), temperature source Oral, resp. rate 20, height 5\' 4"  (1.626 m), weight 97.07 kg (214 lb), last menstrual period 05/31/2010, SpO2 98.00%, unknown if currently breastfeeding.  Physical Exam:  General: alert Lochia: appropriate Uterine Fundus: firm, umbilicus + 2 for fibroids   Assessment/Plan: Plan for discharge tomorrow Plans circumcision in office   LOS: 1 day   Teresa Hudson W 03/04/2011, 7:33 AM

## 2011-03-05 MED ORDER — OXYCODONE-ACETAMINOPHEN 5-325 MG PO TABS: 1.0000 | ORAL_TABLET | ORAL | Status: AC | PRN

## 2011-03-05 NOTE — Discharge Summary (Signed)
Obstetric Discharge Summary Reason for Admission: induction of labor Prenatal Procedures: none Intrapartum Procedures: spontaneous vaginal delivery Postpartum Procedures: none Complications-Operative and Postpartum: first degree perineal laceration Hemoglobin  Date Value Range Status  03/04/2011 10.4* 12.0-15.0 (g/dL) Final     HCT  Date Value Range Status  03/04/2011 32.6* 36.0-46.0 (%) Final    Discharge Diagnoses: Term Pregnancy-delivered                                         Fibroids  Discharge Information: Date: 03/05/2011 Activity: pelvic rest Diet: routine Medications: Percocet Condition: stable Instructions: refer to practice specific booklet Discharge to: home   Newborn Data: Live born female  Birth Weight: 7 lb 11.8 oz (3510 g) APGAR: 8, 9  Home with mother.  Teresa Hudson 03/05/2011, 9:03 AM

## 2011-03-05 NOTE — Progress Notes (Signed)
Post Partum Day 2 Subjective: no complaints, up ad lib and voiding  Objective: Blood pressure 144/87, pulse 96, temperature 98.3 F (36.8 C), temperature source Oral, resp. rate 18, height 5\' 4"  (1.626 m), weight 97.07 kg (214 lb), last menstrual period 05/31/2010, SpO2 98.00%, unknown if currently breastfeeding.  Physical Exam:  General: alert Lochia: appropriate Uterine Fundus: firm I Basename 03/04/11 0520 03/03/11 0735  HGB 10.4* 11.3*  HCT 32.6* 34.3*    Assessment/Plan: Discharge home Percocet Plans circ and Essure in office   LOS: 2 days   Teresa Hudson W 03/05/2011, 7:24 AM

## 2012-01-24 ENCOUNTER — Encounter: Payer: Self-pay | Admitting: Physician Assistant

## 2012-01-24 ENCOUNTER — Ambulatory Visit (INDEPENDENT_AMBULATORY_CARE_PROVIDER_SITE_OTHER): Payer: Self-pay | Admitting: Physician Assistant

## 2012-01-24 ENCOUNTER — Other Ambulatory Visit (HOSPITAL_COMMUNITY)
Admission: RE | Admit: 2012-01-24 | Discharge: 2012-01-24 | Disposition: A | Discharge status: Home / Self Care | Payer: Self-pay | Source: Ambulatory Visit | Attending: Physician Assistant | Admitting: Physician Assistant

## 2012-01-24 VITALS — BP 150/98 | HR 107 | Temp 98.1°F | Ht 64.0 in | Wt 192.8 lb

## 2012-01-24 DIAGNOSIS — IMO0002 Reserved for concepts with insufficient information to code with codable children: Secondary | ICD-10-CM

## 2012-01-24 DIAGNOSIS — Z01812 Encounter for preprocedural laboratory examination: Secondary | ICD-10-CM

## 2012-01-24 DIAGNOSIS — R87612 Low grade squamous intraepithelial lesion on cytologic smear of cervix (LGSIL): Secondary | ICD-10-CM

## 2012-01-24 DIAGNOSIS — R87619 Unspecified abnormal cytological findings in specimens from cervix uteri: Secondary | ICD-10-CM | POA: Insufficient documentation

## 2012-01-24 HISTORY — DX: Reserved for concepts with insufficient information to code with codable children: IMO0002

## 2012-01-24 HISTORY — DX: Low grade squamous intraepithelial lesion on cytologic smear of cervix (LGSIL): R87.612

## 2012-01-24 LAB — POCT PREGNANCY, URINE: Preg Test, Ur: NEGATIVE

## 2012-01-24 NOTE — Progress Notes (Signed)
BP Recheck 132/102  Manual left arm

## 2012-01-24 NOTE — Patient Instructions (Addendum)
Colposcopy Care After Colposcopy is a procedure in which a special tool is used to magnify the surface of the cervix. A tissue sample (biopsy) may also be taken. This sample will be looked at for cervical cancer or other problems. After the test:  You may have some cramping.   Lie down for a few minutes if you feel lightheaded.    You may have some bleeding which should stop in a few days.  HOME CARE  Do not have sex or use tampons for 2 to 3 days or as told.   Only take medicine as told by your doctor.   Continue to take your birth control pills as usual.  Finding out the results of your test Ask when your test results will be ready. Make sure you get your test results. GET HELP RIGHT AWAY IF:  You are bleeding a lot or are passing blood clots.   You develop a fever of 102 F (38.9 C) or higher.   You have abnormal vaginal discharge.   You have cramps that do not go away with medicine.   You feel lightheaded, dizzy, or pass out (faint).  MAKE SURE YOU:   Understand these instructions.   Will watch your condition.   Will get help right away if you are not doing well or get worse.  Document Released: 10/13/2007 Document Revised: 04/15/2011 Document Reviewed: 10/13/2007 [facility] Patient Information 2012 [place], [place].Hypertension As your heart beats, it forces blood through your arteries. This force is your blood pressure. If the pressure is too high, it is called hypertension (HTN) or high blood pressure. HTN is dangerous because you may have it and not know it. High blood pressure may mean that your heart has to work harder to pump blood. Your arteries may be narrow or stiff. The extra work puts you at risk for heart disease, stroke, and other problems.  Blood pressure consists of two numbers, a higher number over a lower, 110/72, for example. It is stated as "110 over 72." The ideal is below 120 for the top number (systolic) and under 80 for the bottom (diastolic). Write  down your blood pressure today. You should pay close attention to your blood pressure if you have certain conditions such as:  Heart failure.   Prior heart attack.   Diabetes   Chronic kidney disease.   Prior stroke.   Multiple risk factors for heart disease.  To see if you have HTN, your blood pressure should be measured while you are seated with your arm held at the level of the heart. It should be measured at least twice. A one-time elevated blood pressure reading (especially in the Emergency Department) does not mean that you need treatment. There may be conditions in which the blood pressure is different between your right and left arms. It is important to see your caregiver soon for a recheck. Most people have essential hypertension which means that there is not a specific cause. This type of high blood pressure may be lowered by changing lifestyle factors such as:  Stress.   Smoking.   Lack of exercise.   Excessive weight.   Drug/tobacco/alcohol use.   Eating less salt.  Most people do not have symptoms from high blood pressure until it has caused damage to the body. Effective treatment can often prevent, delay or reduce that damage. TREATMENT  When a cause has been identified, treatment for high blood pressure is directed at the cause. There are a large number of  medications to treat HTN. These fall into several categories, and your caregiver will help you select the medicines that are best for you. Medications may have side effects. You should review side effects with your caregiver. If your blood pressure stays high after you have made lifestyle changes or started on medicines,   Your medication(s) may need to be changed.   Other problems may need to be addressed.   Be certain you understand your prescriptions, and know how and when to take your medicine.   Be sure to follow up with your caregiver within the time frame advised (usually within two weeks) to have your  blood pressure rechecked and to review your medications.   If you are taking more than one medicine to lower your blood pressure, make sure you know how and at what times they should be taken. Taking two medicines at the same time can result in blood pressure that is too low.  SEEK IMMEDIATE MEDICAL CARE IF:  You develop a severe headache, blurred or changing vision, or confusion.   You have unusual weakness or numbness, or a faint feeling.   You have severe chest or abdominal pain, vomiting, or breathing problems.  MAKE SURE YOU:   Understand these instructions.   Will watch your condition.   Will get help right away if you are not doing well or get worse.  Document Released: 04/26/2005 Document Revised: 04/15/2011 Document Reviewed: 12/15/2007 [facility] Patient Information 2012 [place], [place].

## 2012-01-24 NOTE — Progress Notes (Signed)
Referral from [facility] for LSIL pap. Patient given informed consent, signed copy in the chart, time out was performed.  Placed in lithotomy position. Cervix viewed with speculum and colposcope after application of acetic acid.   Colposcopy adequate?  Yes Acetowhite lesions?No Punctation?No Mosaicism?  No Abnormal vasculature?  No Biopsies?No ECC?Yes  COMMENTS:Awaiting ECC results. Patient was given post procedure instructions.  She will return in 2 weeks for results.   Teresa Hudson E. 3:24 PM

## 2012-02-02 ENCOUNTER — Telehealth: Payer: Self-pay | Admitting: Medical

## 2012-02-02 NOTE — Telephone Encounter (Signed)
Called pt and informed her of negative ECC result.  Pt advised she will need Pap test every 6 months until she has had 2 negative tests in a row. This is recommended because of her abnormal (LSIL) Pap test.  Next Pap will be due in March 2014.  Pt voiced understanding and will call back in February to schedule the appt.

## 2012-02-02 NOTE — Telephone Encounter (Signed)
Patient called requesting results.  

## 2012-06-24 ENCOUNTER — Other Ambulatory Visit: Payer: Self-pay

## 2012-07-01 IMAGING — US US OB COMP LESS 14 WK
1 series · 14 of 28 positions shown · non-contrast
Comparison: None.

CLINICAL DATA: Pregnant, pelvic pain, cramping

OBSTETRIC <14 WK US AND TRANSVAGINAL OB US
TECHNIQUE: Both transabdominal and transvaginal ultrasound
examinations were performed for complete evaluation of the
gestation as well as the maternal uterus, adnexal regions, and
pelvic cul-de-sac.  Transvaginal technique was performed to assess
early pregnancy.

[Series 1: us ob comp less 14 wks · 14 of 52 slices shown]
[im 2/52]
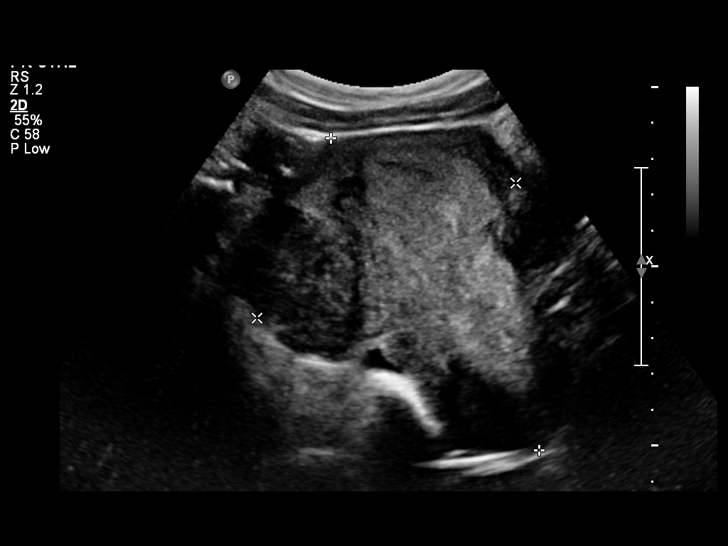
[im 6/52]
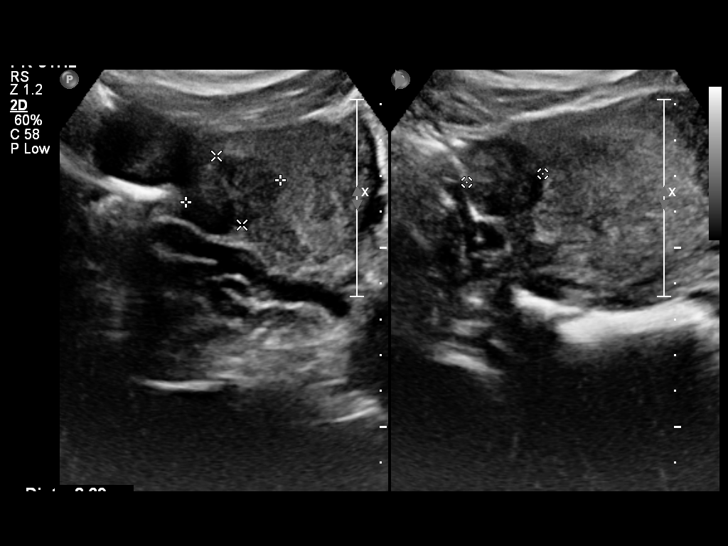
[im 10/52]
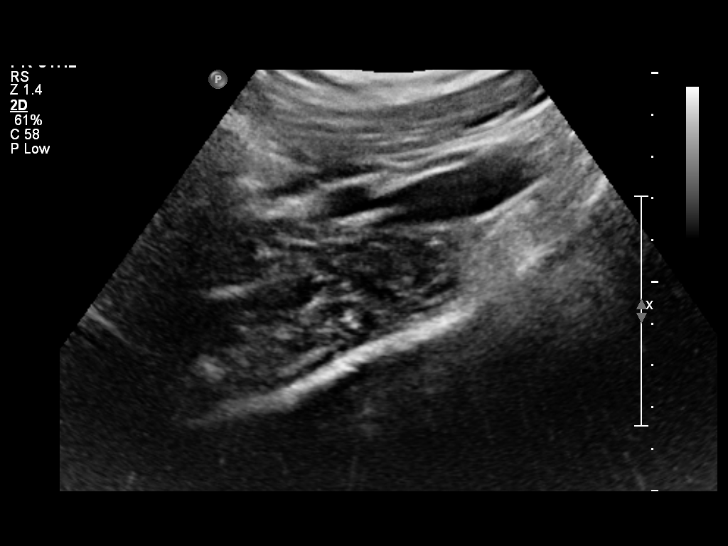
[im 14/52]
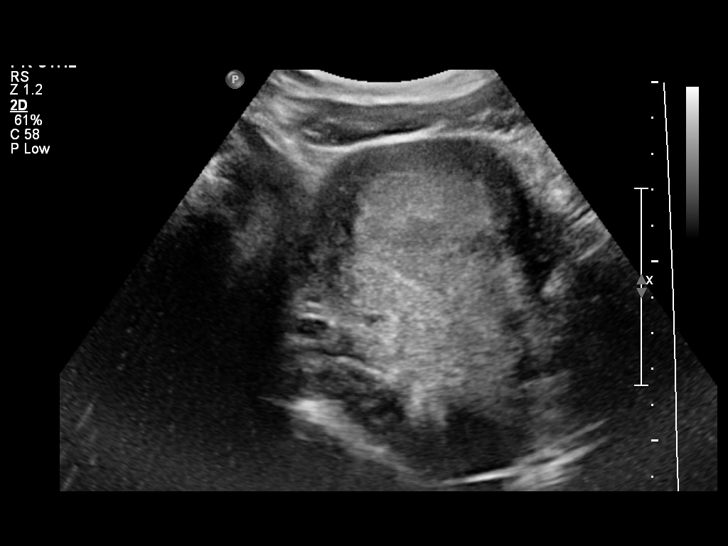
[im 18/52]
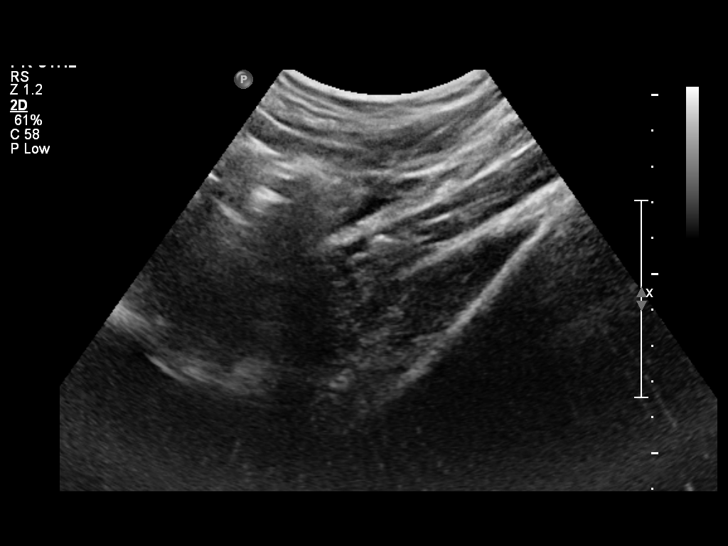
[im 21/52]
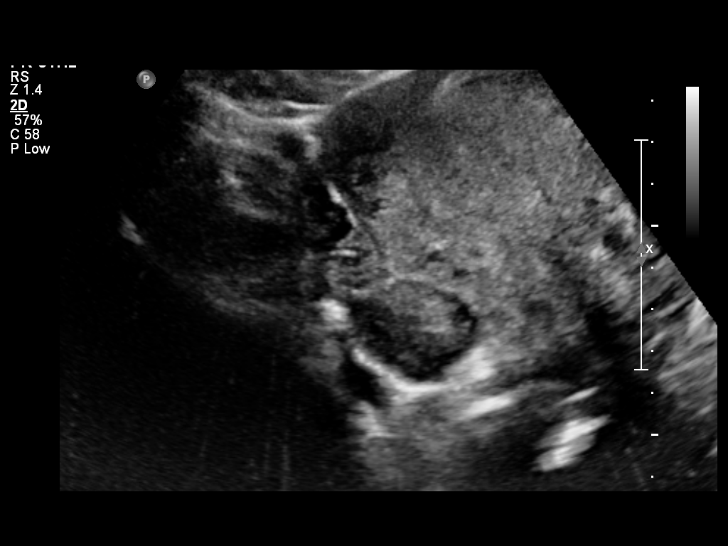
[im 25/52]
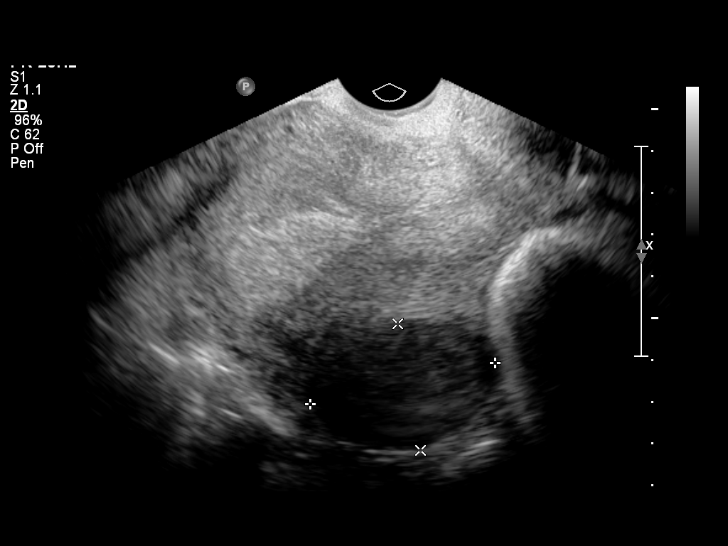
[im 29/52]
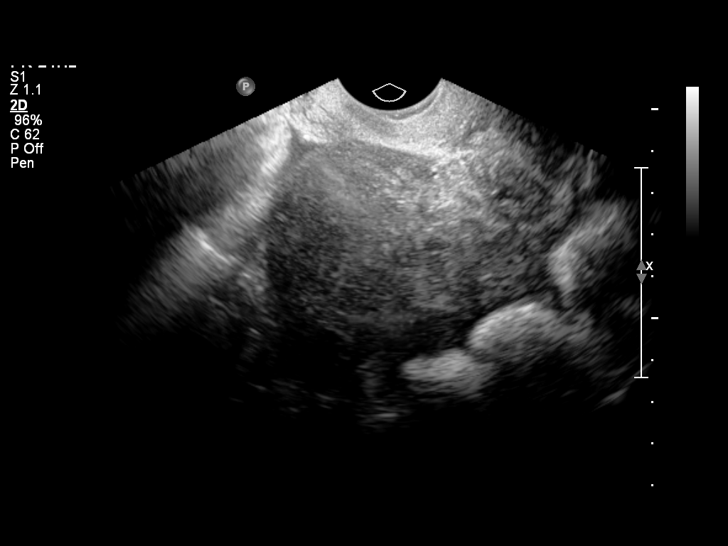
[im 33/52]
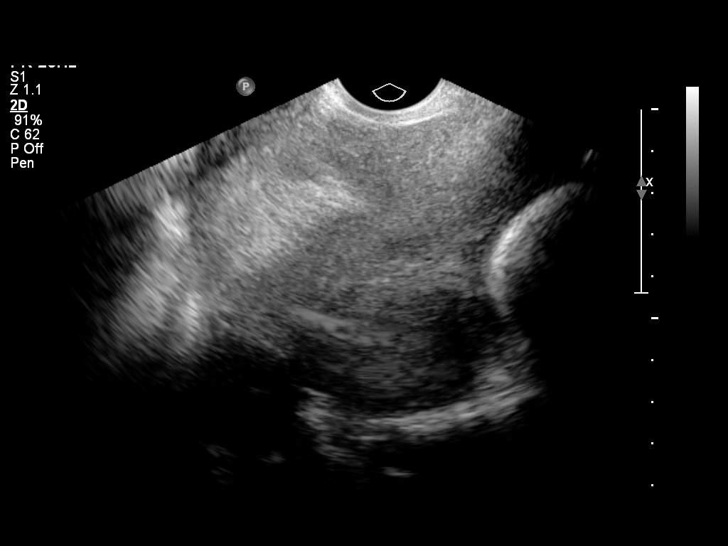
[im 36/52]
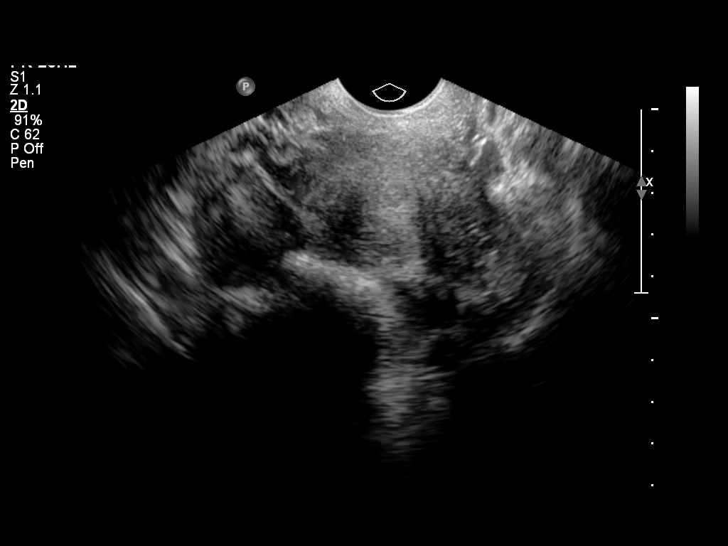
[im 40/52]
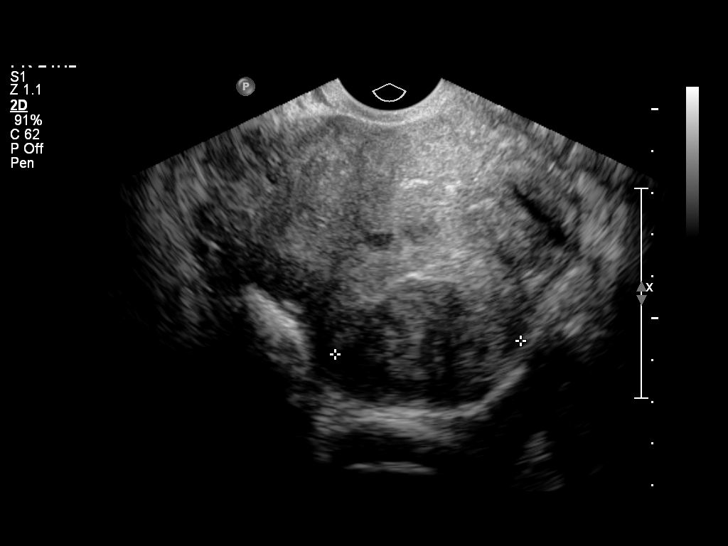
[im 44/52]
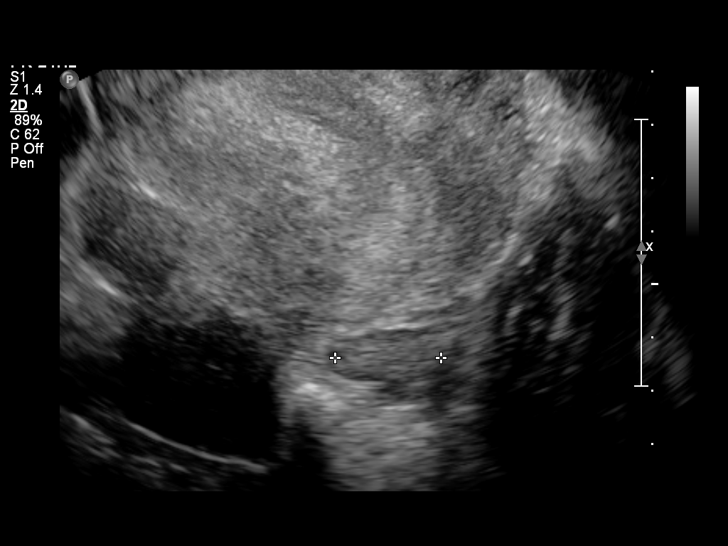
[im 48/52]
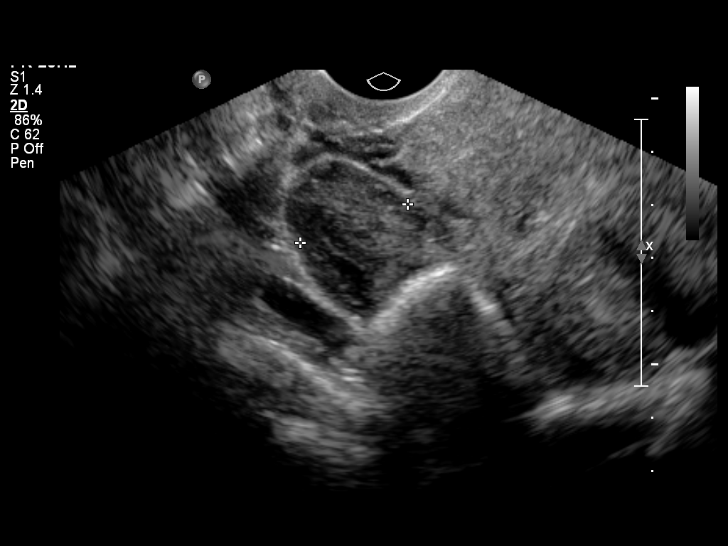
[im 52/52]
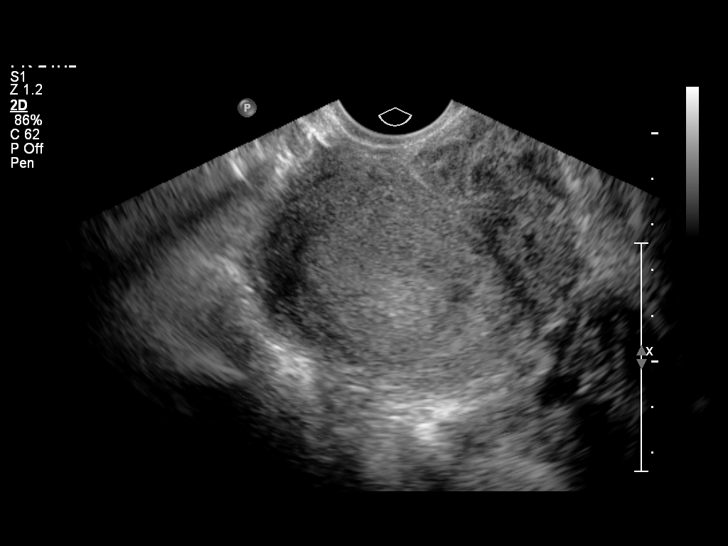

[14 of 28 positions shown; findings below may reference images not displayed]

Intrauterine gestational sac:  None seen
Yolk sac: Not seen
Embryo: Not seen

Maternal uterus/adnexae:
Four uterine fibroids are noted, largest 35 x 38 x 46 mm in the
fundus.
Ovaries unremarkable.
IMPRESSION: 1.  No evidence of an intrauterine gestation.  No sonographic signs
of ectopic pregnancy.  Correlate with serial beta HCG.

## 2012-09-28 ENCOUNTER — Emergency Department (HOSPITAL_BASED_OUTPATIENT_CLINIC_OR_DEPARTMENT_OTHER)
Admission: EM | Admit: 2012-09-28 | Discharge: 2012-09-29 | Disposition: A | Discharge status: Home / Self Care | Payer: Self-pay | Attending: Emergency Medicine | Admitting: Emergency Medicine

## 2012-09-28 ENCOUNTER — Encounter (HOSPITAL_BASED_OUTPATIENT_CLINIC_OR_DEPARTMENT_OTHER): Payer: Self-pay | Admitting: Emergency Medicine

## 2012-09-28 DIAGNOSIS — Z880 Allergy status to penicillin: Secondary | ICD-10-CM | POA: Insufficient documentation

## 2012-09-28 DIAGNOSIS — Z91040 Latex allergy status: Secondary | ICD-10-CM | POA: Insufficient documentation

## 2012-09-28 DIAGNOSIS — Z8742 Personal history of other diseases of the female genital tract: Secondary | ICD-10-CM | POA: Insufficient documentation

## 2012-09-28 DIAGNOSIS — M542 Cervicalgia: Secondary | ICD-10-CM | POA: Insufficient documentation

## 2012-09-28 DIAGNOSIS — Z8619 Personal history of other infectious and parasitic diseases: Secondary | ICD-10-CM | POA: Insufficient documentation

## 2012-09-28 DIAGNOSIS — Z862 Personal history of diseases of the blood and blood-forming organs and certain disorders involving the immune mechanism: Secondary | ICD-10-CM | POA: Insufficient documentation

## 2012-09-28 NOTE — ED Notes (Signed)
C/o neck pain x 3 weeks upon awakening.  Denies recent injury.

## 2012-09-29 MED ORDER — CYCLOBENZAPRINE HCL 10 MG PO TABS: 10.0000 mg | ORAL_TABLET | Freq: Three times a day (TID) | ORAL | Status: DC | PRN

## 2012-09-29 MED ORDER — PREDNISONE 10 MG PO TABS: 20.0000 mg | ORAL_TABLET | Freq: Two times a day (BID) | ORAL | Status: DC

## 2012-09-29 NOTE — ED Provider Notes (Signed)
History     CSN: [id number]  Arrival date & time 09/28/12  2343   First MD Initiated Contact with Patient 09/29/12 0006      Chief Complaint  Patient presents with  . Neck Pain    (Consider location/radiation/quality/duration/timing/severity/associated sxs/prior treatment) HPI Comments: Patient presents with neck pain for the past three weeks she says started after lifting heavy crates of water at [employer]  No radiation into the arms.  No bowel or bladder complaints.    Patient is a 37 y.o. female presenting with neck pain. The history is provided by the patient.  Neck Pain Pain location:  Generalized neck Quality:  Stiffness Stiffness is present:  All day Pain radiates to:  Does not radiate Pain severity:  Moderate Pain is:  Same all the time Duration:  3 weeks Timing:  Constant Progression:  Unchanged Chronicity:  New Relieved by:  Nothing Worsened by:  Nothing tried Ineffective treatments:  Heat (acetaminophen) Associated symptoms: no bladder incontinence, no bowel incontinence, no numbness and no tingling     Past Medical History  Diagnosis Date  . Chlamydia   . Fibroids   . Anemia   . Fibroids   . LSIL (low grade squamous intraepithelial lesion) on Pap smear 01/24/2012    Colpo 01/24/12    Past Surgical History  Procedure Laterality Date  . Dilation and curettage of uterus  1999    Family History  Problem Relation Age of Onset  . Cancer Mother     breast  . Heart disease Mother   . Cancer Father     prostate  . Hypertension Father   . Asthma Sister   . Thyroid cancer Sister   . GI problems Sister     History  Substance Use Topics  . Smoking status: Never Smoker   . Smokeless tobacco: Never Used  . Alcohol Use: No    OB History   Grav Para Term Preterm Abortions TAB SAB Ect Mult Living   5 3 3  2 2    3       Review of Systems  HENT: Positive for neck pain.   Gastrointestinal: Negative for bowel incontinence.  Genitourinary: Negative  for bladder incontinence.  Neurological: Negative for tingling and numbness.  All other systems reviewed and are negative.    Allergies  Sulfa antibiotics; Aspirin; Latex; Penicillins; and Nickel  Home Medications   Current Outpatient Rx  Name  Route  Sig  Dispense  Refill  . cyclobenzaprine (FLEXERIL) 10 MG tablet   Oral   Take 1 tablet (10 mg total) by mouth 3 (three) times daily as needed for muscle spasms.   20 tablet   0   . predniSONE (DELTASONE) 10 MG tablet   Oral   Take 2 tablets (20 mg total) by mouth 2 (two) times daily.   20 tablet   0   . Prenat-FeFum-FePo-FA-Omega 3 (CONCEPT DHA PO)   Oral   Take 1 tablet by mouth daily.             BP 156/95  Pulse 120  Temp(Src) 98.4 F (36.9 C) (Oral)  Resp 16  Ht 5\' 4"  (1.626 m)  Wt 186 lb (84.369 kg)  BMI 31.91 kg/m2  SpO2 100%  LMP 09/27/2012  Physical Exam  Nursing note and vitals reviewed. Constitutional: She is oriented to person, place, and time. She appears well-developed and well-nourished. No distress.  HENT:  Head: Normocephalic and atraumatic.  Mouth/Throat: Oropharynx is clear and moist.  Neck: Normal range of motion. Neck supple.  There is ttp in the soft tissues of the posterior aspect of the neck.  There is no bony tenderness or stepoffs.  Musculoskeletal: Normal range of motion.  Neurological: She is alert and oriented to person, place, and time.  The bue and ble are noted to have 5/5 strength.  She is able to ambulate without difficulty  Skin: Skin is warm and dry. She is not diaphoretic.    ED Course  Procedures (including critical care time)  Labs Reviewed - No data to display No results found.   1. Neck pain       MDM  Seems soft tissue in nature.  Doubt an emergent cause.  Will treat with prednisone, flexeril.  Follow up prn if not improving.          Geoffery Lyons, MD 09/29/12 [phone]

## 2012-10-03 ENCOUNTER — Emergency Department (HOSPITAL_BASED_OUTPATIENT_CLINIC_OR_DEPARTMENT_OTHER)
Admission: EM | Admit: 2012-10-03 | Discharge: 2012-10-03 | Disposition: A | Discharge status: Home / Self Care | Payer: Self-pay | Attending: Emergency Medicine | Admitting: Emergency Medicine

## 2012-10-03 ENCOUNTER — Encounter (HOSPITAL_BASED_OUTPATIENT_CLINIC_OR_DEPARTMENT_OTHER): Payer: Self-pay | Admitting: *Deleted

## 2012-10-03 DIAGNOSIS — Z8742 Personal history of other diseases of the female genital tract: Secondary | ICD-10-CM | POA: Insufficient documentation

## 2012-10-03 DIAGNOSIS — Z880 Allergy status to penicillin: Secondary | ICD-10-CM | POA: Insufficient documentation

## 2012-10-03 DIAGNOSIS — I10 Essential (primary) hypertension: Secondary | ICD-10-CM | POA: Insufficient documentation

## 2012-10-03 DIAGNOSIS — R209 Unspecified disturbances of skin sensation: Secondary | ICD-10-CM | POA: Insufficient documentation

## 2012-10-03 DIAGNOSIS — R202 Paresthesia of skin: Secondary | ICD-10-CM

## 2012-10-03 DIAGNOSIS — Z8619 Personal history of other infectious and parasitic diseases: Secondary | ICD-10-CM | POA: Insufficient documentation

## 2012-10-03 DIAGNOSIS — Z91040 Latex allergy status: Secondary | ICD-10-CM | POA: Insufficient documentation

## 2012-10-03 DIAGNOSIS — Z862 Personal history of diseases of the blood and blood-forming organs and certain disorders involving the immune mechanism: Secondary | ICD-10-CM | POA: Insufficient documentation

## 2012-10-03 NOTE — ED Notes (Signed)
Pt c/o tingling and pain in her right leg from her thigh down to her calf and intermittent pain in her left leg since Friday. Pt seen here last Thurs for neck pain. Pt sts she has been taking the prednisone and flexeril without relief.

## 2012-10-03 NOTE — ED Notes (Signed)
MD at bedside. 

## 2012-10-03 NOTE — ED Provider Notes (Signed)
History     CSN: [id number]  Arrival date & time 10/03/12  4098   First MD Initiated Contact with Patient 10/03/12 0345      Chief Complaint  Patient presents with  . Leg Pain    (Consider location/radiation/quality/duration/timing/severity/associated sxs/prior treatment) HPI Comments: Pt comes in with cc of bilateral leg tingling. She has hx of HTN. No diabetes, neuropathy or any systemic conditions. Pt reports that since Thursday she has been having some tingling bilaterally, right wirse than left. No back pain. The tingling is intermittent, usually worse when laying compared to walking. No hx of similar complains.  Patient is a 37 y.o. female presenting with leg pain. The history is provided by the patient.  Leg Pain Associated symptoms: no neck pain     Past Medical History  Diagnosis Date  . Chlamydia   . Fibroids   . Anemia   . Fibroids   . LSIL (low grade squamous intraepithelial lesion) on Pap smear 01/24/2012    Colpo 01/24/12    Past Surgical History  Procedure Laterality Date  . Dilation and curettage of uterus  1999    Family History  Problem Relation Age of Onset  . Cancer Mother     breast  . Heart disease Mother   . Cancer Father     prostate  . Hypertension Father   . Asthma Sister   . Thyroid cancer Sister   . GI problems Sister     History  Substance Use Topics  . Smoking status: Never Smoker   . Smokeless tobacco: Never Used  . Alcohol Use: No    OB History   Grav Para Term Preterm Abortions TAB SAB Ect Mult Living   5 3 3  2 2    3       Review of Systems  Constitutional: Negative for activity change.  HENT: Negative for neck pain.   Respiratory: Negative for shortness of breath.   Cardiovascular: Negative for chest pain.  Gastrointestinal: Negative for nausea, vomiting and abdominal pain.  Genitourinary: Negative for dysuria.  Neurological: Negative for weakness, numbness and headaches.    Allergies  Sulfa antibiotics;  Aspirin; Latex; Penicillins; and Nickel  Home Medications   Current Outpatient Rx  Name  Route  Sig  Dispense  Refill  . cyclobenzaprine (FLEXERIL) 10 MG tablet   Oral   Take 1 tablet (10 mg total) by mouth 3 (three) times daily as needed for muscle spasms.   20 tablet   0   . predniSONE (DELTASONE) 10 MG tablet   Oral   Take 2 tablets (20 mg total) by mouth 2 (two) times daily.   20 tablet   0   . Prenat-FeFum-FePo-FA-Omega 3 (CONCEPT DHA PO)   Oral   Take 1 tablet by mouth daily.             BP 152/99  Pulse 84  Temp(Src) 98.3 F (36.8 C) (Oral)  Resp 20  Ht 5\' 4"  (1.626 m)  Wt 186 lb (84.369 kg)  BMI 31.91 kg/m2  SpO2 100%  LMP 09/27/2012  Physical Exam  Nursing note and vitals reviewed. Constitutional: She is oriented to person, place, and time. She appears well-developed and well-nourished.  HENT:  Head: Normocephalic and atraumatic.  Eyes: EOM are normal. Pupils are equal, round, and reactive to light.  Neck: Neck supple.  Cardiovascular: Normal rate, regular rhythm and normal heart sounds.   No murmur heard. Pulmonary/Chest: Effort normal. No respiratory distress.  Abdominal: Soft.  She exhibits no distension. There is no tenderness. There is no rebound and no guarding.  Neurological: She is alert and oriented to person, place, and time.  Sensory exam normal for bilateral upper and lower extremities - and patient is able to discriminate between sharp and dull. Motor exam is 4+/5 Bilateral equal patellar reflex, 2+   Skin: Skin is warm and dry.    ED Course  Procedures (including critical care time)  Labs Reviewed - No data to display No results found.   No diagnosis found.    MDM  Pt comes in with cc of leg tingling. Describes the tingling as pins and needles, like her leg is "falling asleep."  Sx are intermittent, worse at rest. No back pain, leg pain.  Dont know what the paresthesias are from. Mild infection? Systemic condition? Poor  blood circulation? Nerve impingement?  Recommended to wait and see what happens over the next 2-3 weeks - if not better, consider seeing a Neurologist for optimal management.  Derwood Kaplan, MD 10/03/12 [phone]

## 2012-10-20 IMAGING — US US OB DETAIL+14 WK
1 series · 14 of 28 positions shown · non-contrast
Comparison: none

[Series 1: us ob detail+14 wk · 0.13mm/px · 14 of 105 slices shown]
[im 4/105]
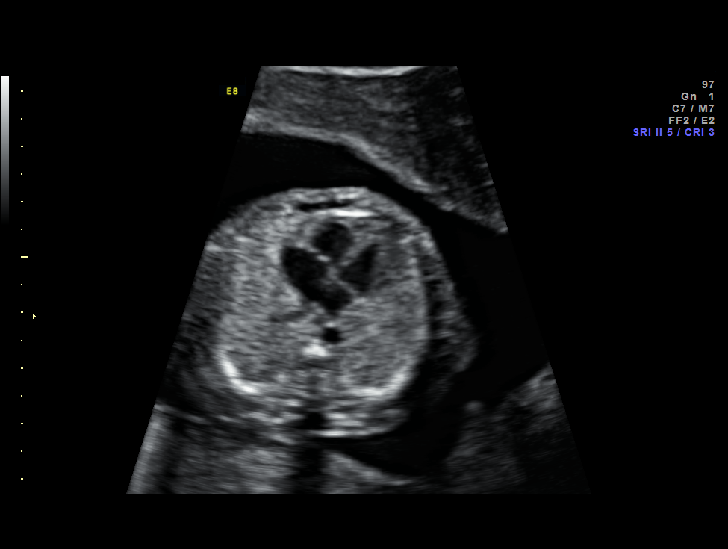
[im 12/105]
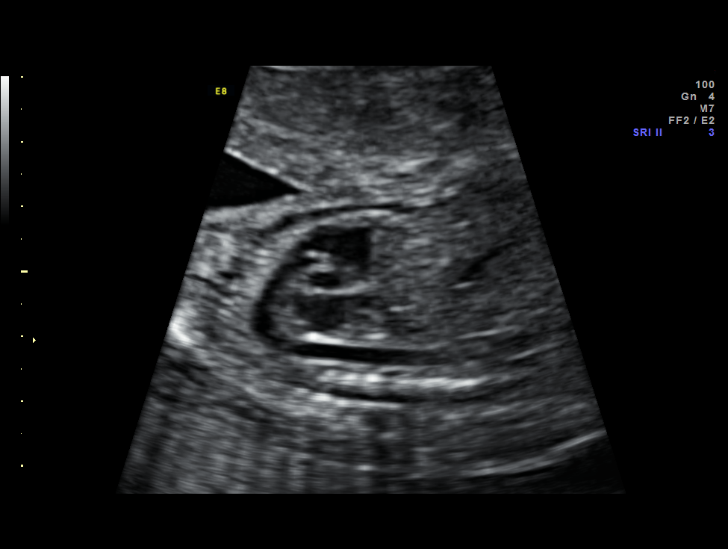
[im 20/105]
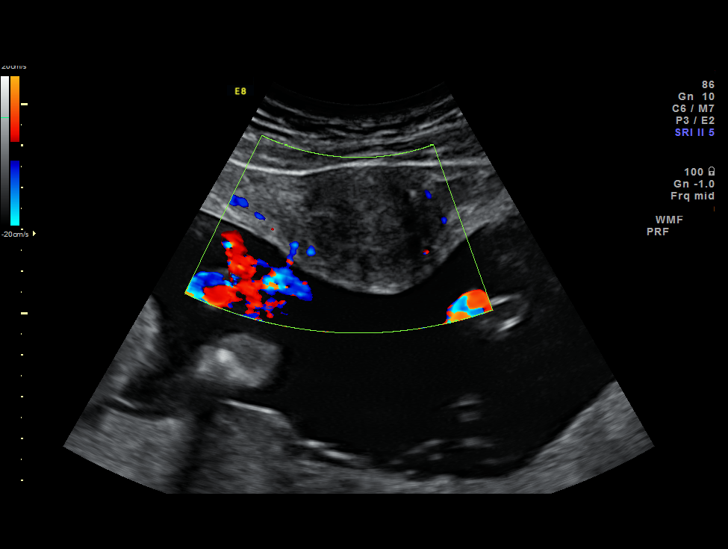
[im 27/105]
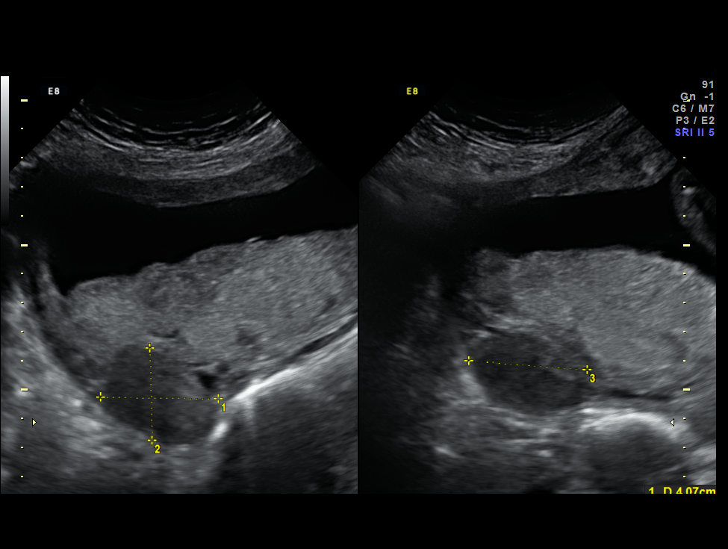
[im 35/105]
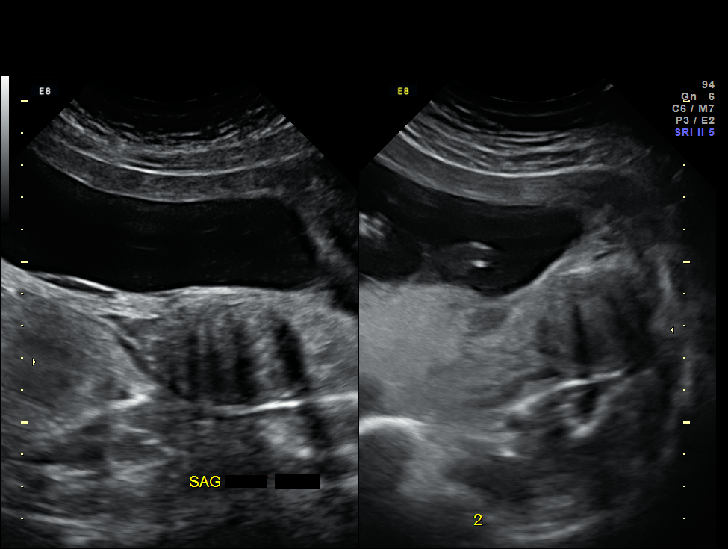
[im 43/105]
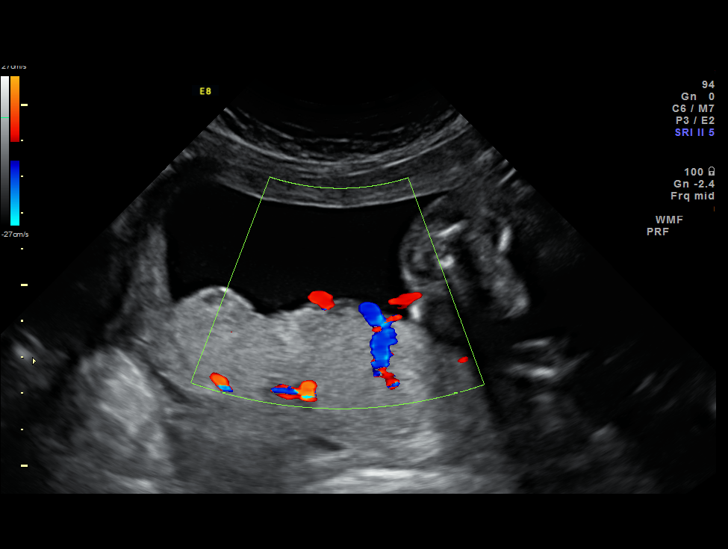
[im 51/105]
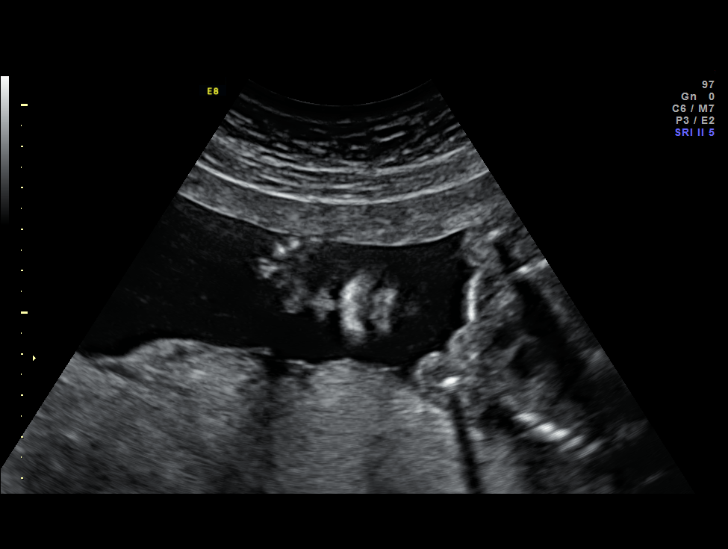
[im 58/105]
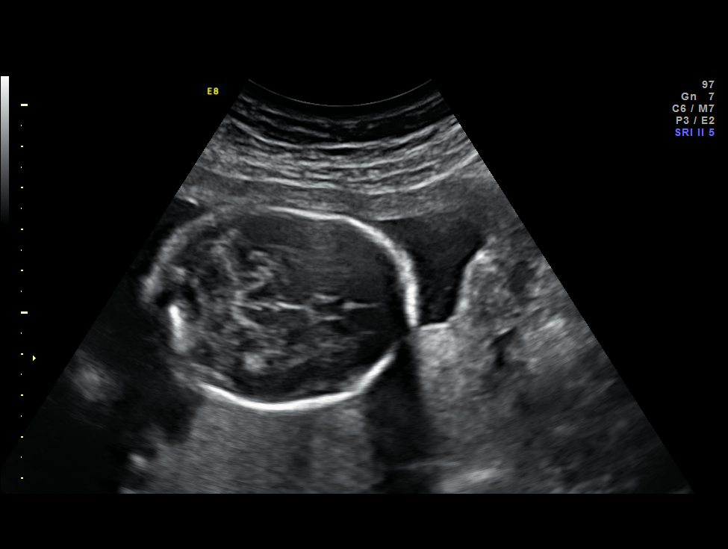
[im 66/105]
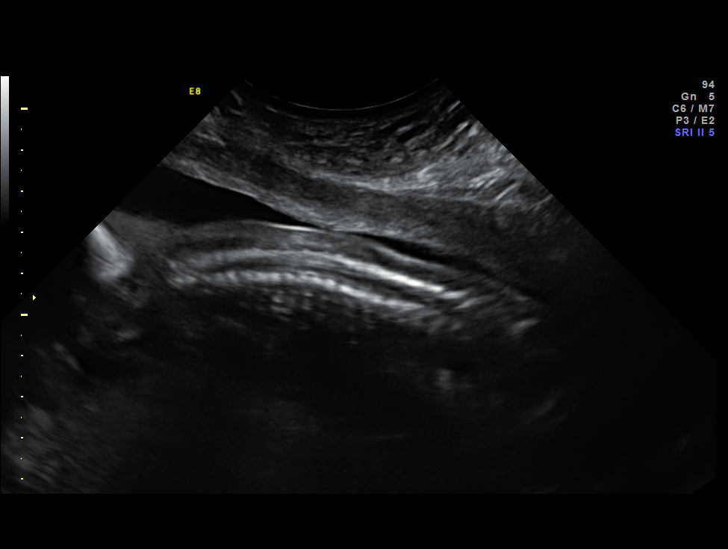
[im 74/105]
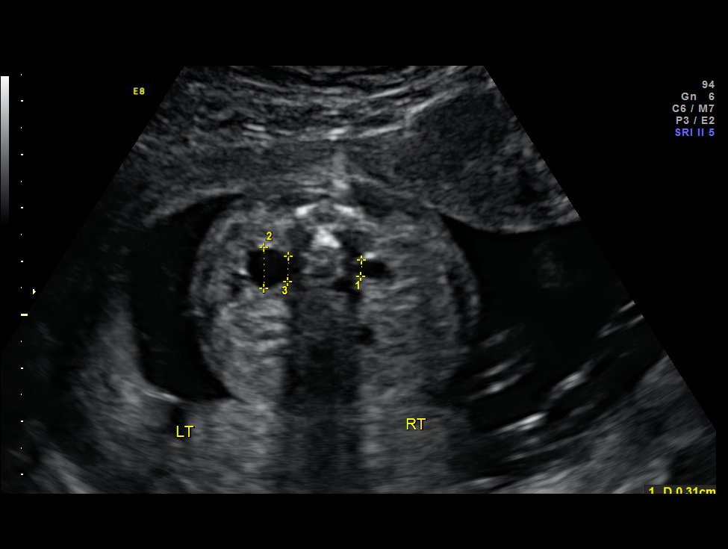
[im 81/105]
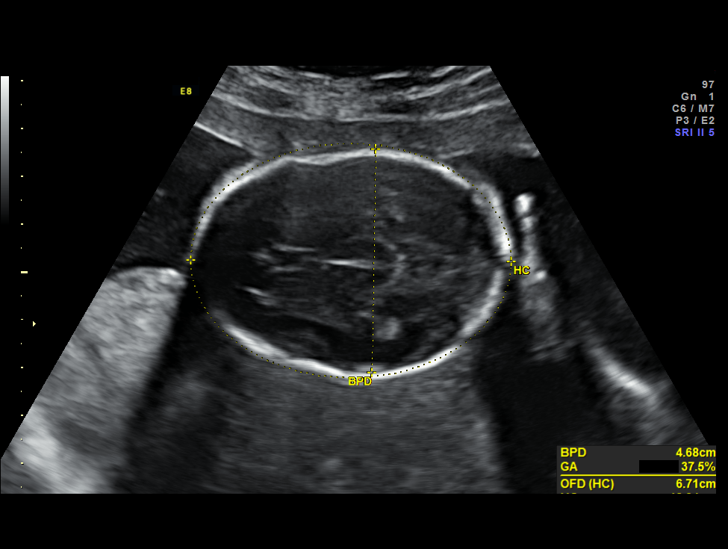
[im 89/105]
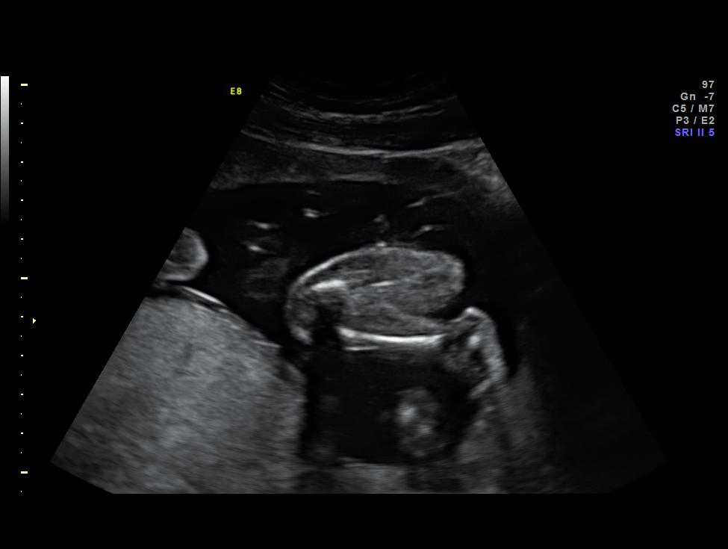
[im 97/105]
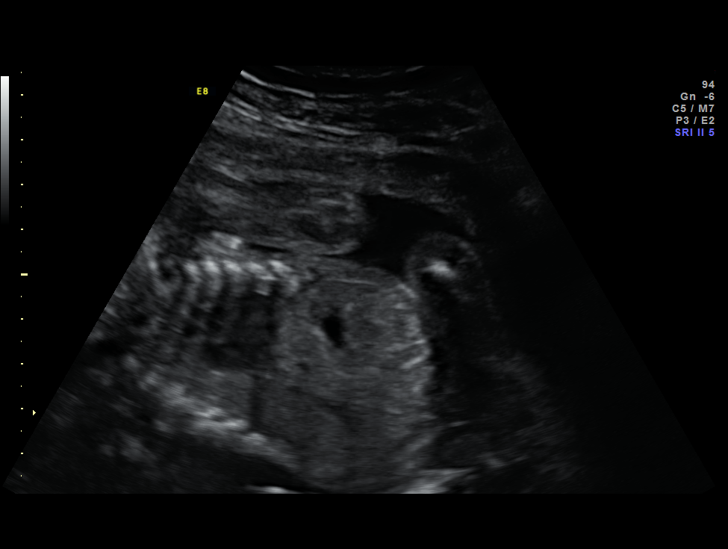
[im 105/105]
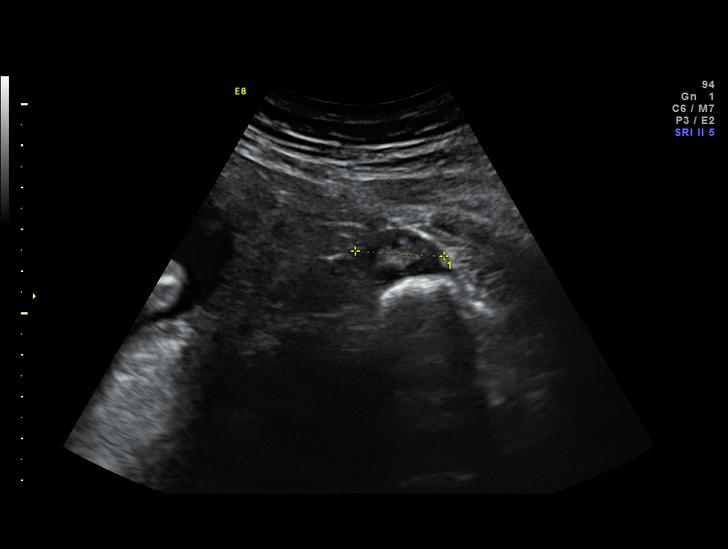

[14 of 28 positions shown; findings below may reference images not displayed]

Canned report from images found in remote index.

Refer to host system for actual result text.

## 2012-11-01 ENCOUNTER — Encounter: Payer: Self-pay | Admitting: Obstetrics & Gynecology

## 2012-11-01 ENCOUNTER — Ambulatory Visit (INDEPENDENT_AMBULATORY_CARE_PROVIDER_SITE_OTHER): Payer: Self-pay | Admitting: Obstetrics & Gynecology

## 2012-11-01 ENCOUNTER — Other Ambulatory Visit (HOSPITAL_COMMUNITY)
Admission: RE | Admit: 2012-11-01 | Discharge: 2012-11-01 | Disposition: A | Discharge status: Home / Self Care | Payer: Self-pay | Source: Ambulatory Visit | Attending: Obstetrics & Gynecology | Admitting: Obstetrics & Gynecology

## 2012-11-01 VITALS — BP 141/97 | HR 94 | Temp 97.2°F | Ht 64.0 in | Wt 180.0 lb

## 2012-11-01 DIAGNOSIS — Z0000 Encounter for general adult medical examination without abnormal findings: Secondary | ICD-10-CM

## 2012-11-01 DIAGNOSIS — Z1151 Encounter for screening for human papillomavirus (HPV): Secondary | ICD-10-CM | POA: Insufficient documentation

## 2012-11-01 DIAGNOSIS — IMO0002 Reserved for concepts with insufficient information to code with codable children: Secondary | ICD-10-CM

## 2012-11-01 DIAGNOSIS — Z01419 Encounter for gynecological examination (general) (routine) without abnormal findings: Secondary | ICD-10-CM | POA: Insufficient documentation

## 2012-11-01 DIAGNOSIS — Z113 Encounter for screening for infections with a predominantly sexual mode of transmission: Secondary | ICD-10-CM | POA: Insufficient documentation

## 2012-11-01 DIAGNOSIS — R6889 Other general symptoms and signs: Secondary | ICD-10-CM

## 2012-11-01 NOTE — Addendum Note (Signed)
Addended by: Allie Bossier on: 11/01/2012 04:21 PM   Modules accepted: Orders

## 2012-11-01 NOTE — Progress Notes (Addendum)
Subjective:    Teresa Hudson is a 37 y.o. female who presents for an annual exam. The patient has no complaints today. The patient is sexually active. GYN screening history: last pap: was normal. The patient wears seatbelts: yes. The patient participates in regular exercise: no. Has the patient ever been transfused or tattooed?: yes. (tattoos)The patient reports that there is not domestic violence in her life.   Menstrual History: OB History   Grav Para Term Preterm Abortions TAB SAB Ect Mult Living   5 3 3  2 2    3       Menarche age: 31  Patient's last menstrual period was 10/25/2012.    The following portions of the patient's history were reviewed and updated as appropriate: allergies, current medications, past family history, past medical history, past social history, past surgical history and problem list.  Review of Systems A comprehensive review of systems was negative. 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 She is currently using withdrawal for birth control. She is considering LARC.   Objective:    BP 141/97  Pulse 94  Temp(Src) 97.2 F (36.2 C) (Oral)  Ht 5\' 4"  (1.626 m)  Wt 180 lb (81.647 kg)  BMI 30.88 kg/m2  LMP 10/25/2012  General Appearance:    Alert, cooperative, no distress, appears stated age  Head:    Normocephalic, without obvious abnormality, atraumatic  Eyes:    PERRL, conjunctiva/corneas clear, EOM's intact, fundi    benign, both eyes  Ears:    Normal TM's and external ear canals, both ears  Nose:   Nares normal, septum midline, mucosa normal, no drainage    or sinus tenderness  Throat:   Lips, mucosa, and tongue normal; teeth and gums normal  Neck:   Supple, symmetrical, trachea midline, no adenopathy;    thyroid:  no enlargement/tenderness/nodules; no carotid   bruit or JVD  Back:     Symmetric, no curvature, ROM normal, no CVA tenderness  Lungs:     Clear to auscultation bilaterally, respirations unlabored  Chest Wall:    No tenderness or  deformity   Heart:    Regular rate and rhythm, S1 and S2 normal, no murmur, rub   or gallop  Breast Exam:    No tenderness, masses, or nipple abnormality  Abdomen:     Soft, non-tender, bowel sounds active all four quadrants,    no masses, no organomegaly  Genitalia:    Normal female without lesion, discharge or tenderness, NSSA, NT, normal adnexal exam     Extremities:   Extremities normal, atraumatic, no cyanosis or edema  Pulses:   2+ and symmetric all extremities  Skin:   Skin color, texture, turgor normal, no rashes or lesions  Lymph nodes:   Cervical, supraclavicular, and axillary nodes normal  Neurologic:   CNII-XII intact, normal strength, sensation and reflexes    throughout  .    Assessment:    Healthy female exam.    Plan:     Chlamydia specimen. GC specimen. Thin prep Pap smear.  Information given about Mirena She is requesting STI testing.

## 2012-11-02 LAB — RPR

## 2012-11-02 LAB — HEPATITIS C ANTIBODY: HCV Ab: NEGATIVE

## 2012-11-02 LAB — HIV ANTIBODY (ROUTINE TESTING W REFLEX): HIV: NONREACTIVE

## 2012-11-15 ENCOUNTER — Telehealth: Payer: Self-pay | Admitting: General Practice

## 2012-11-15 NOTE — Telephone Encounter (Signed)
Patient called and left message stating she would like test results

## 2012-11-15 NOTE — Telephone Encounter (Signed)
Called patient back and informed her of negative results from 6/25 visit. Patient asked when she would need to come in for her next visit since she had the procedure done in September. Told patient since her results were normal to just follow up with [place] in one year, next June. Patient verbalized understanding and had no further questions

## 2012-11-24 ENCOUNTER — Ambulatory Visit: Payer: Self-pay

## 2012-12-05 ENCOUNTER — Ambulatory Visit: Payer: Self-pay | Attending: Family Medicine | Admitting: Internal Medicine

## 2012-12-05 ENCOUNTER — Encounter: Payer: Self-pay | Admitting: Internal Medicine

## 2012-12-05 VITALS — BP 147/101 | HR 86 | Temp 98.6°F | Ht 64.0 in | Wt 185.8 lb

## 2012-12-05 DIAGNOSIS — I10 Essential (primary) hypertension: Secondary | ICD-10-CM | POA: Insufficient documentation

## 2012-12-05 MED ORDER — HYDROCHLOROTHIAZIDE 25 MG PO TABS: 12.5000 mg | ORAL_TABLET | Freq: Every day | ORAL | Status: DC

## 2012-12-05 NOTE — Progress Notes (Unsigned)
Patient was referred to [place] from [facility] due to hypertension. Patient is not on any medication at the moment for this.

## 2012-12-05 NOTE — Patient Instructions (Signed)

## 2012-12-05 NOTE — Progress Notes (Unsigned)
Patient ID: Lewie Chamber, female   DOB: February 11, 1976, 37 y.o.   MRN: [id number]     NICCOLE WITTHUHN JYN:[id number] DOB: [birth year] DOA: (Not on file)   PCP: Standley Dakins, MD   Chief Complaint: referred from [facility] for elevated BP  HPI:  37 year old overweight female with a history of chlamydial infection , LSIL who follows up at [facility] was referred to the clinic for uncontrolled blood pressure. Patient reports her blood pressure to be elevated since pregnancy in 2012 and has been running between 140s to 90s since then. She was briefly on blood pressure medication but stopped after the delivery. She reports trying to lose some weight by walking about 4 days of the week. She reports that she is not compliant with diet. She does report off-and-on headaches, denies blurred vision, chest pain, nausea, vomiting, abdominal pain, palpitations, shortness of breath, bowel or urinary symptoms.  Review of Systems:  Constitutional: Denies fever, chills, diaphoresis, appetite change and fatigue.  HEENT: Reports off and on headache, no blurry vision, Denies photophobia, eye pain, redness, hearing loss, ear pain, congestion, sore throat, rhinorrhea, sneezing, mouth sores, trouble swallowing, neck pain, neck stiffness and tinnitus.   Respiratory: Denies SOB, DOE, cough, chest tightness,  and wheezing.   Cardiovascular: Denies chest pain, palpitations and leg swelling.  Gastrointestinal: Denies nausea, vomiting, abdominal pain, diarrhea, constipation, blood in stool and abdominal distention.  Genitourinary: Denies dysuria, urgency, frequency, hematuria, flank pain and difficulty urinating.  Endocrine: Denies: hot or cold intolerance,  polyuria, polydipsia. Musculoskeletal: Denies myalgias, back pain, joint swelling, arthralgias and gait problem.  Skin: Denies pallor, rash and wound.  Neurological: Denies dizziness, seizures, syncope, weakness, light-headedness, numbness. Headache   off-and-on    Past Medical History  Diagnosis Date  . Chlamydia   . Fibroids   . Anemia   . Fibroids   . LSIL (low grade squamous intraepithelial lesion) on Pap smear 01/24/2012    Colpo 01/24/12  . Hypertension    Past Surgical History  Procedure Laterality Date  . Dilation and curettage of uterus  1999   Social History:  reports that she has never smoked. She has never used smokeless tobacco. She reports that she does not drink alcohol or use illicit drugs.  Allergies  Allergen Reactions  . Sulfa Antibiotics Anaphylaxis  . Aspirin Swelling  . Latex Itching and Swelling  . Penicillins Swelling    Facial swelling  . Nickel Rash    Family History  Problem Relation Age of Onset  . Cancer Mother     breast  . Heart disease Mother   . Cancer Father     prostate  . Hypertension Father   . Asthma Sister   . Thyroid cancer Sister   . GI problems Sister     Prior to Admission medications   Medication Sig Start Date End Date Taking? Authorizing Provider  cyclobenzaprine (FLEXERIL) 10 MG tablet Take 1 tablet (10 mg total) by mouth 3 (three) times daily as needed for muscle spasms. 09/29/12   Geoffery Lyons, MD  predniSONE (DELTASONE) 10 MG tablet Take 2 tablets (20 mg total) by mouth 2 (two) times daily. 09/29/12   Geoffery Lyons, MD  Prenat-FeFum-FePo-FA-Omega 3 (CONCEPT DHA PO) Take 1 tablet by mouth daily.      Historical Provider, MD    Physical Exam:  Filed Vitals:   12/05/12 1205  BP: 147/101  Pulse: 86  Temp: 98.6 F (37 C)  TempSrc: Oral  Height: 5\' 4"  (1.626  m)  Weight: 185 lb 12.8 oz (84.278 kg)  SpO2: 100%    Constitutional: Vital signs reviewed.  Middle aged female in NAD HEENT: No pallor, moist oral mucosa Cardiovascular: RRR, S1 normal, S2 normal, no MRG, pulses symmetric and intact bilaterally Pulmonary/Chest: CTAB, no wheezes, rales, or rhonchi Abdominal: Soft. Non-tender, non-distended, bowel sounds are normal, no masses, organomegaly, or guarding  present.  Musculoskeletal: No joint deformities, erythema, or stiffness, ROM full and no nontender Ext: no edema and no cyanosis, pulses palpable bilaterally Hematology: no cervical, adenopathy.  Neurological: A&O x3, non focal Skin: Warm, dry and intact. No rash, cyanosis, or clubbing.      Assessment/Plan Hypertension Patient has elevated blood pressure for almost 2 years and not on any medications. We'll start her on HCTZ 12.5 mg daily. I have strongly counseled her on dietary adherence, going down on salt intake in diet, avoiding fatty foods and caffeinated beverages. Also encouraged her on a regular moderate exercise and trying to lose weight.  Health maintenance Patient has had a Pap smear done recently and follows up at [facility].  Followup in 2 weeks for blood pressure monitoring.  Kasia Trego  12/05/2012, 12:42 PM

## 2012-12-13 ENCOUNTER — Ambulatory Visit: Payer: Self-pay

## 2013-01-12 IMAGING — US US OB FOLLOW-UP
1 series · 14 of 28 positions shown · non-contrast
Comparison: none

[Series 1: us ob follow-up · 0.23mm/px · 14 of 54 slices shown]
[im 2/54]
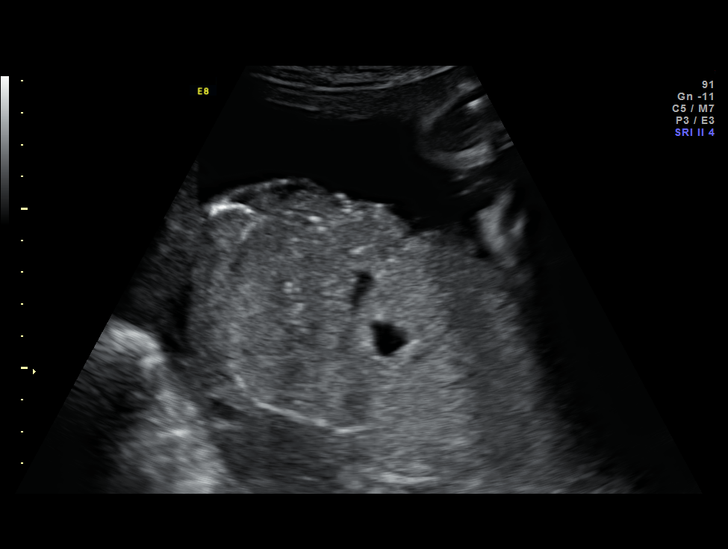
[im 6/54]
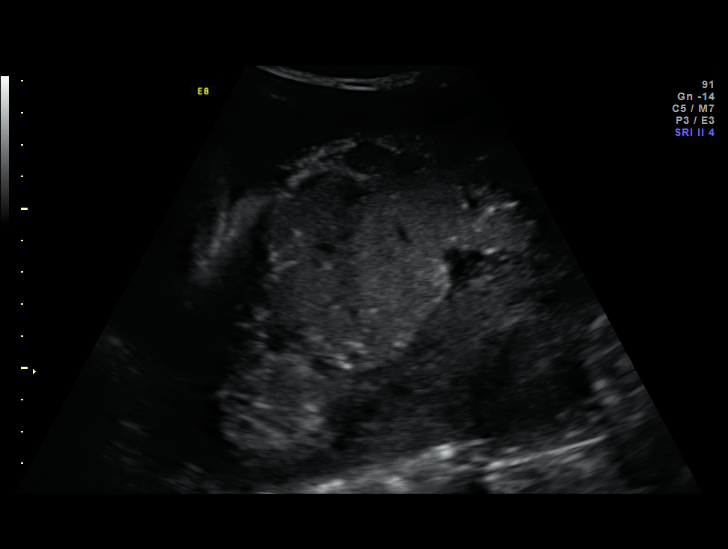
[im 10/54]
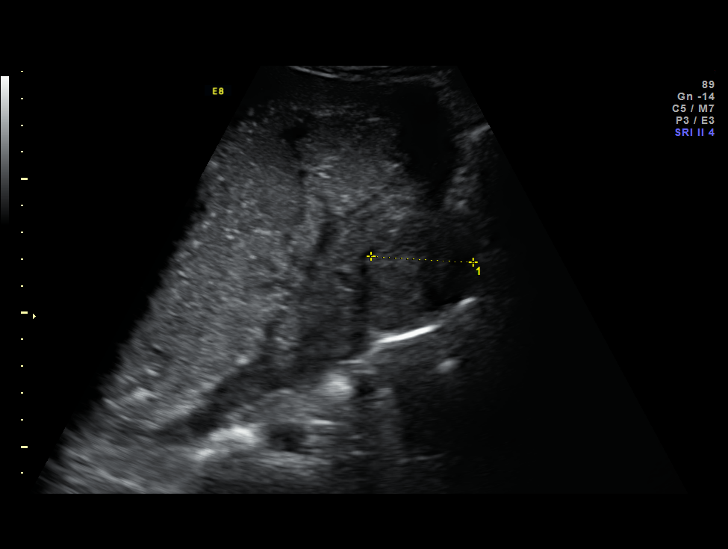
[im 14/54]
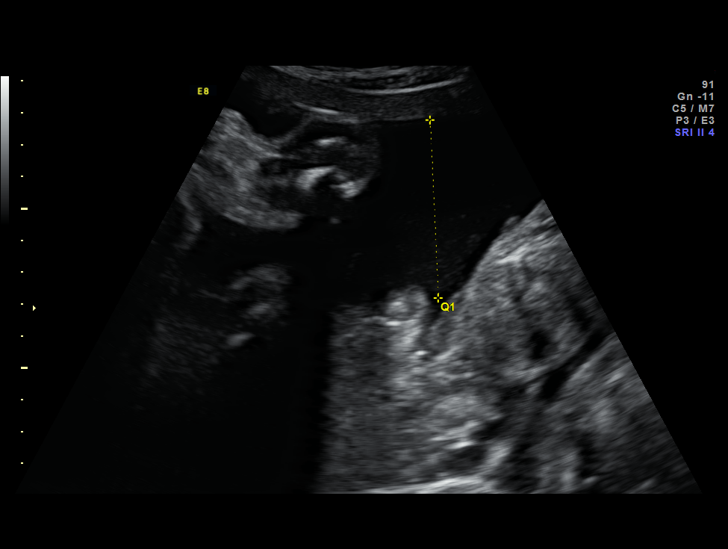
[im 18/54]
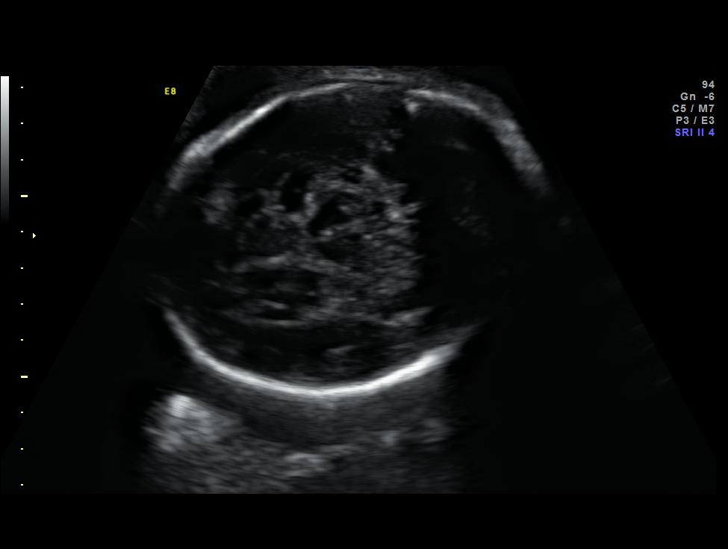
[im 22/54]
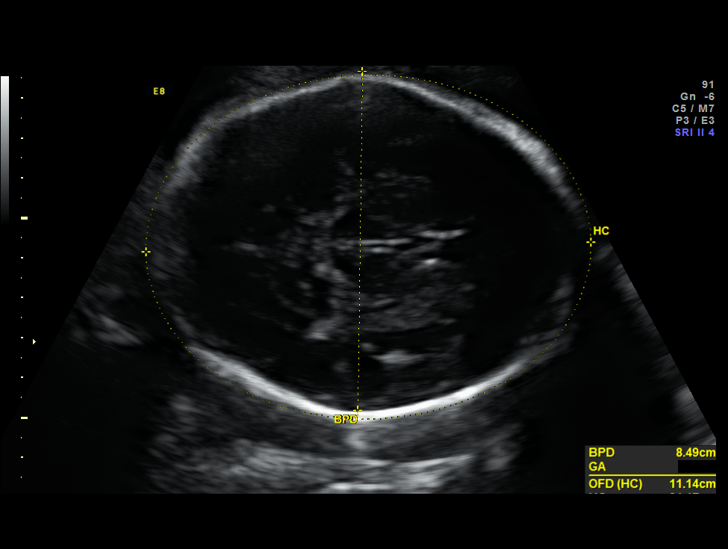
[im 26/54]
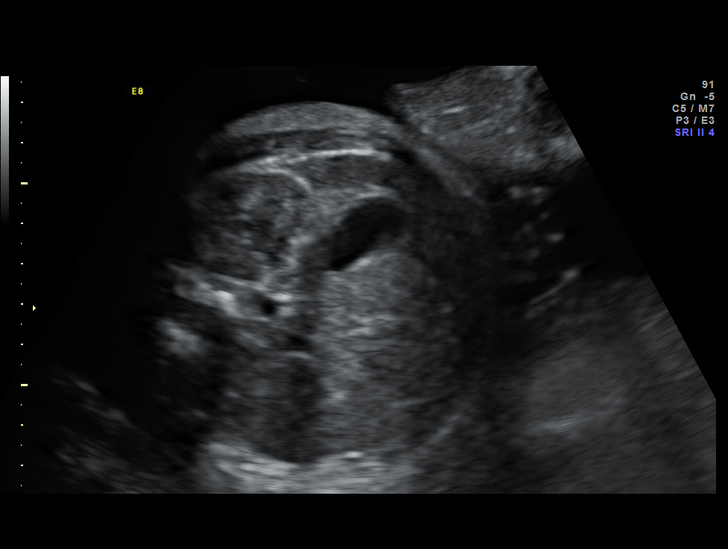
[im 30/54]
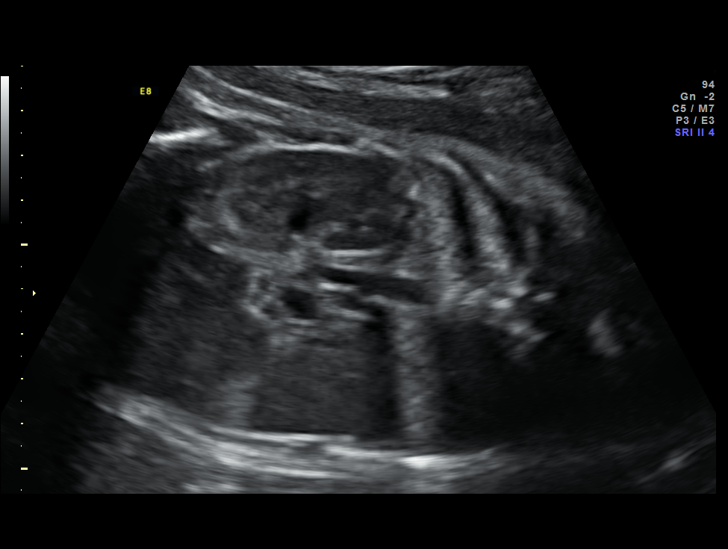
[im 34/54]
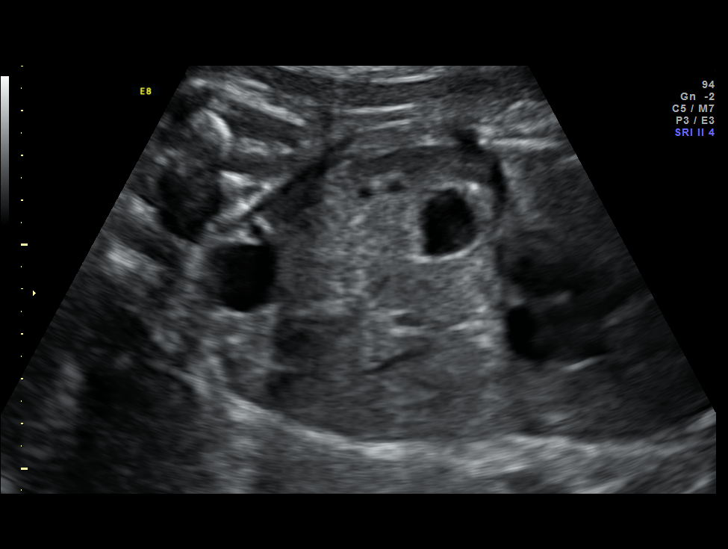
[im 38/54]
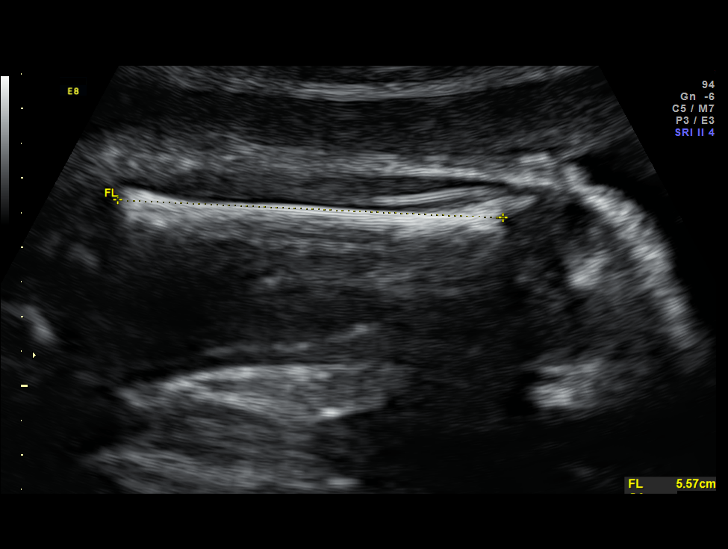
[im 42/54]
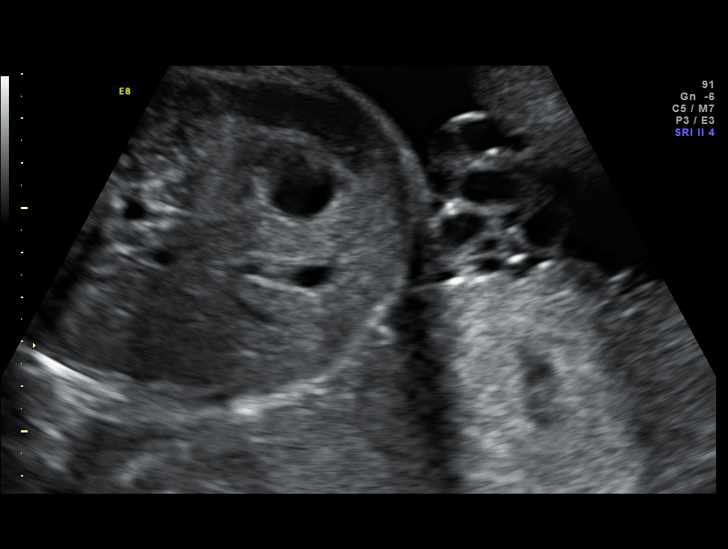
[im 46/54]
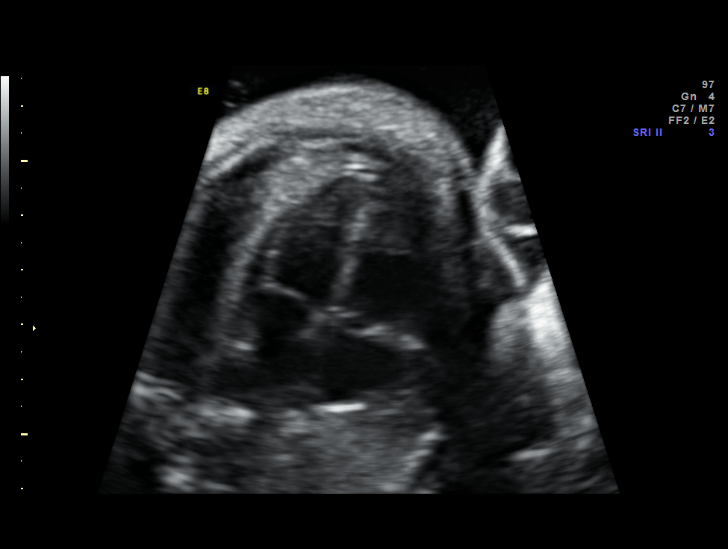
[im 50/54]
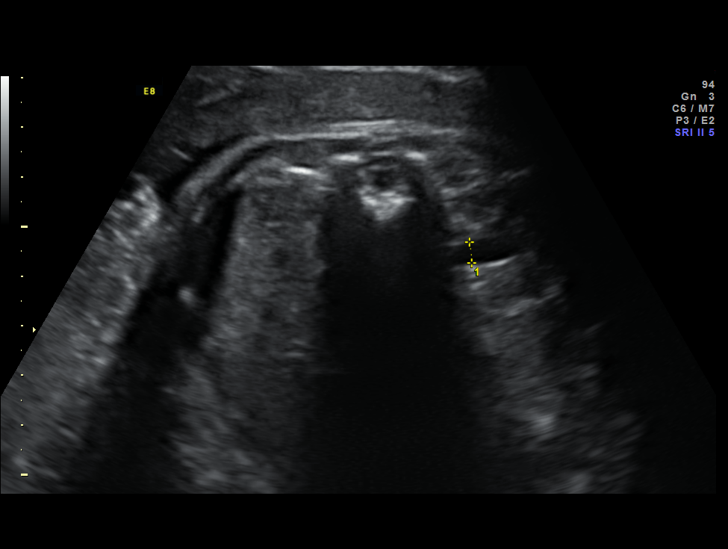
[im 54/54]
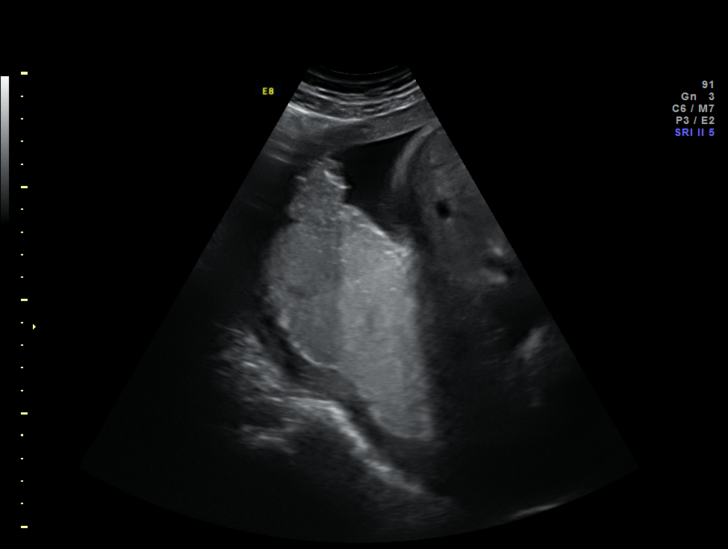

[14 of 28 positions shown; findings below may reference images not displayed]

Canned report from images found in remote index.

Refer to host system for actual result text.

## 2013-01-29 ENCOUNTER — Emergency Department (HOSPITAL_BASED_OUTPATIENT_CLINIC_OR_DEPARTMENT_OTHER)
Admission: EM | Admit: 2013-01-29 | Discharge: 2013-01-29 | Disposition: A | Discharge status: Home / Self Care | Payer: Self-pay | Attending: Emergency Medicine | Admitting: Emergency Medicine

## 2013-01-29 ENCOUNTER — Encounter (HOSPITAL_BASED_OUTPATIENT_CLINIC_OR_DEPARTMENT_OTHER): Payer: Self-pay | Admitting: *Deleted

## 2013-01-29 DIAGNOSIS — Z862 Personal history of diseases of the blood and blood-forming organs and certain disorders involving the immune mechanism: Secondary | ICD-10-CM | POA: Insufficient documentation

## 2013-01-29 DIAGNOSIS — Z3202 Encounter for pregnancy test, result negative: Secondary | ICD-10-CM | POA: Insufficient documentation

## 2013-01-29 DIAGNOSIS — I10 Essential (primary) hypertension: Secondary | ICD-10-CM | POA: Insufficient documentation

## 2013-01-29 DIAGNOSIS — Z91040 Latex allergy status: Secondary | ICD-10-CM | POA: Insufficient documentation

## 2013-01-29 DIAGNOSIS — Z880 Allergy status to penicillin: Secondary | ICD-10-CM | POA: Insufficient documentation

## 2013-01-29 DIAGNOSIS — E876 Hypokalemia: Secondary | ICD-10-CM | POA: Insufficient documentation

## 2013-01-29 DIAGNOSIS — F411 Generalized anxiety disorder: Secondary | ICD-10-CM | POA: Insufficient documentation

## 2013-01-29 DIAGNOSIS — Z8542 Personal history of malignant neoplasm of other parts of uterus: Secondary | ICD-10-CM | POA: Insufficient documentation

## 2013-01-29 DIAGNOSIS — IMO0002 Reserved for concepts with insufficient information to code with codable children: Secondary | ICD-10-CM | POA: Insufficient documentation

## 2013-01-29 DIAGNOSIS — Z8742 Personal history of other diseases of the female genital tract: Secondary | ICD-10-CM | POA: Insufficient documentation

## 2013-01-29 DIAGNOSIS — Z8619 Personal history of other infectious and parasitic diseases: Secondary | ICD-10-CM | POA: Insufficient documentation

## 2013-01-29 DIAGNOSIS — Z79899 Other long term (current) drug therapy: Secondary | ICD-10-CM | POA: Insufficient documentation

## 2013-01-29 LAB — CBC WITH DIFFERENTIAL/PLATELET
Basophils Relative: 0 % (ref 0–1)
Eosinophils Absolute: 0.1 10*3/uL (ref 0.0–0.7)
MCH: 27.4 pg (ref 26.0–34.0)
MCHC: 33.1 g/dL (ref 30.0–36.0)
Neutrophils Relative %: 43 % (ref 43–77)
Platelets: 277 10*3/uL (ref 150–400)
RBC: 4.97 MIL/uL (ref 3.87–5.11)

## 2013-01-29 LAB — PREGNANCY, URINE: Preg Test, Ur: NEGATIVE

## 2013-01-29 LAB — BASIC METABOLIC PANEL
GFR calc Af Amer: 84 mL/min — ABNORMAL LOW (ref >90)
GFR calc non Af Amer: 73 mL/min — ABNORMAL LOW (ref >90)
Potassium: 3 mEq/L — ABNORMAL LOW (ref 3.5–5.1)
Sodium: 138 mEq/L (ref 135–145)

## 2013-01-29 LAB — TROPONIN I: Troponin I: <0.3 ng/mL (ref <0.30)

## 2013-01-29 MED ORDER — LORATADINE 10 MG PO TABS
10.0000 mg | ORAL_TABLET | Freq: Once | ORAL | Status: AC
  Administered 2013-01-29: 10 mg via ORAL
  Filled 2013-01-29: qty 1

## 2013-01-29 MED ORDER — POTASSIUM CHLORIDE CRYS ER 20 MEQ PO TBCR
40.0000 meq | EXTENDED_RELEASE_TABLET | Freq: Once | ORAL | Status: AC
  Administered 2013-01-29: 40 meq via ORAL
  Filled 2013-01-29: qty 2

## 2013-01-29 NOTE — ED Notes (Addendum)
Pt c/o frontal h/a that started a week ago along with some dizziness. States intermittent dizziness. Denies any fevers. Also c/o of "palpations" denies any cp but states she has had some sob.  States she has been taking tylenol for pain. States hx of allergies, not taking anything for this currently. EKG Sinus. Denies any long car rides. No evidence of stroke symptoms. Grips equal. Speech clear. Alert and oriented times 3.

## 2013-01-29 NOTE — ED Provider Notes (Signed)
CSN: [id number]     Arrival date & time 01/29/13  0319 History   First MD Initiated Contact with Patient 01/29/13 [phone]     Chief Complaint  Patient presents with  . elevated blood pressure    (Consider location/radiation/quality/duration/timing/severity/associated sxs/prior Treatment) Patient is a 37 y.o. female presenting with hypertension. The history is provided by the patient.  Hypertension This is a chronic problem. The current episode started more than 1 week ago. The problem occurs constantly. The problem has been gradually worsening. Pertinent negatives include no chest pain and no abdominal pain. Associated symptoms comments: Mild frontal headache which she attributed to HTN. Nothing aggravates the symptoms. Nothing relieves the symptoms. She has tried nothing for the symptoms. The treatment provided no relief.  No CP, no DOE.  No leg swelling no long car trips.  No OCPs.  No weakness no numbness no visual nor speech changes.  No back pain.  No changes in cognition.  No tick exposure.    Past Medical History  Diagnosis Date  . Chlamydia   . Fibroids   . Anemia   . Fibroids   . LSIL (low grade squamous intraepithelial lesion) on Pap smear 01/24/2012    Colpo 01/24/12  . Hypertension    Past Surgical History  Procedure Laterality Date  . Dilation and curettage of uterus  1999   Family History  Problem Relation Age of Onset  . Cancer Mother     breast  . Heart disease Mother   . Cancer Father     prostate  . Hypertension Father   . Asthma Sister   . Thyroid cancer Sister   . GI problems Sister    History  Substance Use Topics  . Smoking status: Never Smoker   . Smokeless tobacco: Never Used  . Alcohol Use: No   OB History   Grav Para Term Preterm Abortions TAB SAB Ect Mult Living   5 3 3  2 2    3      Review of Systems  Constitutional: Negative for fever and diaphoresis.  HENT: Negative for neck pain and neck stiffness.   Eyes: Negative for photophobia.   Respiratory: Negative for apnea, cough, chest tightness, wheezing and stridor.   Cardiovascular: Negative for chest pain, palpitations and leg swelling.  Gastrointestinal: Negative for nausea, vomiting and abdominal pain.  Skin: Negative for rash.  Neurological: Negative for syncope, facial asymmetry, speech difficulty, weakness and numbness.  All other systems reviewed and are negative.    Allergies  Sulfa antibiotics; Aspirin; Latex; Penicillins; and Nickel  Home Medications   Current Outpatient Rx  Name  Route  Sig  Dispense  Refill  . cyclobenzaprine (FLEXERIL) 10 MG tablet   Oral   Take 1 tablet (10 mg total) by mouth 3 (three) times daily as needed for muscle spasms.   20 tablet   0   . hydrochlorothiazide (HYDRODIURIL) 25 MG tablet   Oral   Take 0.5 tablets (12.5 mg total) by mouth daily.   30 tablet   3   . predniSONE (DELTASONE) 10 MG tablet   Oral   Take 2 tablets (20 mg total) by mouth 2 (two) times daily.   20 tablet   0   . Prenat-FeFum-FePo-FA-Omega 3 (CONCEPT DHA PO)   Oral   Take 1 tablet by mouth daily.            BP 138/101  Pulse 97  LMP 01/22/2013 Physical Exam  Constitutional: She is oriented to  person, place, and time. She appears well-developed and well-nourished. No distress.  HENT:  Head: Normocephalic and atraumatic.  Mouth/Throat: Oropharynx is clear and moist.  Eyes: Conjunctivae and EOM are normal. Pupils are equal, round, and reactive to light.  Neck: Normal range of motion. Neck supple. No tracheal deviation present. No thyromegaly present.  Cardiovascular: Normal rate, regular rhythm and intact distal pulses.   Pulmonary/Chest: Effort normal and breath sounds normal. No stridor. No respiratory distress. She has no wheezes.  Abdominal: Soft. Bowel sounds are normal. There is no tenderness. There is no rebound and no guarding.  Musculoskeletal: Normal range of motion. She exhibits no edema and no tenderness.  Lymphadenopathy:     She has no cervical adenopathy.  Neurological: She is alert and oriented to person, place, and time. She has normal reflexes. She displays normal reflexes. No cranial nerve deficit. She exhibits normal muscle tone. Coordination normal.  Skin: Skin is warm and dry. She is not diaphoretic.  Psychiatric: Her mood appears anxious.    ED Course  Procedures (including critical care time) Labs Review Labs Reviewed  CBC WITH DIFFERENTIAL  PREGNANCY, URINE  BASIC METABOLIC PANEL  TROPONIN I   Imaging Review No results found.  MDM  No diagnosis found. PERC negative/ [place] 0   Date: 01/29/2013  Rate 80  Rhythm: normal sinus rhythm  QRS Axis: normal  Intervals: normal  ST/T Wave abnormalities: normal  Conduction Disutrbances: none  Narrative Interpretation: unremarkable   Patient was concerned her BP was out of control as cuff at home said 180+ and thought the mild frontal HA was related to same.  Then had anxiety and felt like she was short of breath.  Suspect mild HA is a combination of stress and seasonal allergies which patient has a h/o. Neuro exam is normal and reassuring.  Without fevers, neck stiffness rash nor tick exposure there is no indication for imaging nor LP at this time.  Suspect allergies and anxiety as a source.  Patient feels improved and is reassured post evaluation and hearing that her BP is not markedly elevated.  Patient informed at electronic BP cuffs can be very inaccurate depending on the cuff and cuff size may not be appropriate and give a reading that is inaccurate.  Have repleted potassium.  Advised close follow up with Dr. Laural Benes for ongoing care of BP.  Patient verbalizes understanding and agrees to follow up    Laporche Martelle Smitty Cords, MD 01/29/13 [phone]

## 2013-02-08 ENCOUNTER — Ambulatory Visit: Payer: Self-pay | Admitting: Family Medicine

## 2013-02-08 ENCOUNTER — Ambulatory Visit: Payer: Self-pay

## 2013-02-21 ENCOUNTER — Ambulatory Visit: Payer: Self-pay

## 2013-03-03 ENCOUNTER — Emergency Department (HOSPITAL_BASED_OUTPATIENT_CLINIC_OR_DEPARTMENT_OTHER)
Admission: EM | Admit: 2013-03-03 | Discharge: 2013-03-03 | Disposition: A | Discharge status: Home / Self Care | Payer: Self-pay | Attending: Emergency Medicine | Admitting: Emergency Medicine

## 2013-03-03 ENCOUNTER — Encounter (HOSPITAL_BASED_OUTPATIENT_CLINIC_OR_DEPARTMENT_OTHER): Payer: Self-pay | Admitting: Emergency Medicine

## 2013-03-03 ENCOUNTER — Emergency Department (HOSPITAL_BASED_OUTPATIENT_CLINIC_OR_DEPARTMENT_OTHER): Payer: Self-pay

## 2013-03-03 DIAGNOSIS — Z880 Allergy status to penicillin: Secondary | ICD-10-CM | POA: Insufficient documentation

## 2013-03-03 DIAGNOSIS — Z91040 Latex allergy status: Secondary | ICD-10-CM | POA: Insufficient documentation

## 2013-03-03 DIAGNOSIS — R0789 Other chest pain: Secondary | ICD-10-CM | POA: Insufficient documentation

## 2013-03-03 DIAGNOSIS — Z3202 Encounter for pregnancy test, result negative: Secondary | ICD-10-CM | POA: Insufficient documentation

## 2013-03-03 DIAGNOSIS — Z8742 Personal history of other diseases of the female genital tract: Secondary | ICD-10-CM | POA: Insufficient documentation

## 2013-03-03 DIAGNOSIS — IMO0002 Reserved for concepts with insufficient information to code with codable children: Secondary | ICD-10-CM | POA: Insufficient documentation

## 2013-03-03 DIAGNOSIS — Z862 Personal history of diseases of the blood and blood-forming organs and certain disorders involving the immune mechanism: Secondary | ICD-10-CM | POA: Insufficient documentation

## 2013-03-03 DIAGNOSIS — Z8619 Personal history of other infectious and parasitic diseases: Secondary | ICD-10-CM | POA: Insufficient documentation

## 2013-03-03 DIAGNOSIS — I10 Essential (primary) hypertension: Secondary | ICD-10-CM | POA: Insufficient documentation

## 2013-03-03 DIAGNOSIS — F411 Generalized anxiety disorder: Secondary | ICD-10-CM | POA: Insufficient documentation

## 2013-03-03 DIAGNOSIS — F419 Anxiety disorder, unspecified: Secondary | ICD-10-CM

## 2013-03-03 DIAGNOSIS — Z79899 Other long term (current) drug therapy: Secondary | ICD-10-CM | POA: Insufficient documentation

## 2013-03-03 DIAGNOSIS — E876 Hypokalemia: Secondary | ICD-10-CM | POA: Insufficient documentation

## 2013-03-03 LAB — CBC WITH DIFFERENTIAL/PLATELET
Basophils Absolute: 0 10*3/uL (ref 0.0–0.1)
Eosinophils Relative: 1 % (ref 0–5)
Lymphocytes Relative: 45 % (ref 12–46)
Neutro Abs: 2.6 10*3/uL (ref 1.7–7.7)
Neutrophils Relative %: 41 % — ABNORMAL LOW (ref 43–77)
Platelets: 263 10*3/uL (ref 150–400)
RDW: 14.7 % (ref 11.5–15.5)
WBC: 6.4 10*3/uL (ref 4.0–10.5)

## 2013-03-03 LAB — URINALYSIS, ROUTINE W REFLEX MICROSCOPIC
Bilirubin Urine: NEGATIVE
Ketones, ur: NEGATIVE mg/dL
Nitrite: NEGATIVE
Specific Gravity, Urine: 1.013 (ref 1.005–1.030)
Urobilinogen, UA: 0.2 mg/dL (ref 0.0–1.0)

## 2013-03-03 LAB — BASIC METABOLIC PANEL
CO2: 26 mEq/L (ref 19–32)
Calcium: 9.3 mg/dL (ref 8.4–10.5)
Sodium: 140 mEq/L (ref 135–145)

## 2013-03-03 LAB — PREGNANCY, URINE: Preg Test, Ur: NEGATIVE

## 2013-03-03 LAB — TROPONIN I: Troponin I: <0.3 ng/mL (ref <0.30)

## 2013-03-03 MED ORDER — POTASSIUM CHLORIDE CRYS ER 20 MEQ PO TBCR: 20.0000 meq | EXTENDED_RELEASE_TABLET | Freq: Two times a day (BID) | ORAL | Status: DC

## 2013-03-03 NOTE — ED Notes (Signed)
Per pt: chest heaviness, dizziness, feelings of being hot and cold, and bilateral leg discomfort since yesterday, and "frequent overboard urination and sensation to pee." NAD, skin warm and dry, vitals signs stable, respirations even and unlabored, no leg swelling or redness noted.

## 2013-03-03 NOTE — ED Provider Notes (Signed)
CSN: [id number]     Arrival date & time 03/03/13  0320 History   First MD Initiated Contact with Patient 03/03/13 0326     Chief Complaint  Patient presents with  . Chest Pain   (Consider location/radiation/quality/duration/timing/severity/associated sxs/prior Treatment) Patient is a 37 y.o. female presenting with chest pain. The history is provided by the patient.  Chest Pain Pain location:  Unable to specify Pain quality: dull   Pain radiates to:  Does not radiate Pain radiates to the back: no   Pain severity:  Moderate Onset quality:  Gradual Duration:  2 days Timing:  Constant Progression:  Unchanged Chronicity:  Recurrent Context: stress   Relieved by:  Nothing Worsened by:  Nothing tried Ineffective treatments:  None tried Associated symptoms: no fever, no headache, no lower extremity edema, no numbness, no syncope, not vomiting and no weakness   Risk factors: immobilization   Risk factors: no aortic disease and no prior DVT/PE   Risk factors comment:  No travel, but helping out with the death of a loved one   Past Medical History  Diagnosis Date  . Chlamydia   . Fibroids   . Anemia   . Fibroids   . LSIL (low grade squamous intraepithelial lesion) on Pap smear 01/24/2012    Colpo 01/24/12  . Hypertension    Past Surgical History  Procedure Laterality Date  . Dilation and curettage of uterus  1999   Family History  Problem Relation Age of Onset  . Cancer Mother     breast  . Heart disease Mother   . Cancer Father     prostate  . Hypertension Father   . Asthma Sister   . Thyroid cancer Sister   . GI problems Sister    History  Substance Use Topics  . Smoking status: Never Smoker   . Smokeless tobacco: Never Used  . Alcohol Use: No   OB History   Grav Para Term Preterm Abortions TAB SAB Ect Mult Living   5 3 3  2 2    3      Review of Systems  Constitutional: Negative for fever.  Eyes: Negative for photophobia.  Respiratory: Positive for chest  tightness. Negative for wheezing.   Cardiovascular: Negative for chest pain, leg swelling and syncope.  Gastrointestinal: Negative for vomiting.  Neurological: Negative for seizures, facial asymmetry, weakness, numbness and headaches.  Psychiatric/Behavioral: The patient is nervous/anxious.   All other systems reviewed and are negative.    Allergies  Sulfa antibiotics; Aspirin; Latex; Penicillins; and Nickel  Home Medications   Current Outpatient Rx  Name  Route  Sig  Dispense  Refill  . cyclobenzaprine (FLEXERIL) 10 MG tablet   Oral   Take 1 tablet (10 mg total) by mouth 3 (three) times daily as needed for muscle spasms.   20 tablet   0   . hydrochlorothiazide (HYDRODIURIL) 25 MG tablet   Oral   Take 0.5 tablets (12.5 mg total) by mouth daily.   30 tablet   3   . predniSONE (DELTASONE) 10 MG tablet   Oral   Take 2 tablets (20 mg total) by mouth 2 (two) times daily.   20 tablet   0   . Prenat-FeFum-FePo-FA-Omega 3 (CONCEPT DHA PO)   Oral   Take 1 tablet by mouth daily.            BP 140/94  Pulse 88  Temp(Src) 98.5 F (36.9 C) (Oral)  Resp 16  SpO2 100%  LMP  02/17/2013 Physical Exam  Constitutional: She is oriented to person, place, and time. She appears well-developed and well-nourished. No distress.  HENT:  Head: Normocephalic and atraumatic.  Mouth/Throat: Oropharynx is clear and moist.  Eyes: Conjunctivae are normal. Pupils are equal, round, and reactive to light.  Neck: Normal range of motion. Neck supple.  Cardiovascular: Normal rate, regular rhythm and intact distal pulses.   Pulmonary/Chest: Effort normal and breath sounds normal. She has no wheezes. She has no rales. She exhibits no tenderness.  Abdominal: Soft. Bowel sounds are normal. There is no tenderness. There is no rebound and no guarding.  Musculoskeletal: Normal range of motion. She exhibits no edema and no tenderness.  Neurological: She is alert and oriented to person, place, and time.   Skin: Skin is warm and dry. She is not diaphoretic.  Psychiatric: Her mood appears anxious.    ED Course  Procedures (including critical care time) Labs Review Labs Reviewed  CBC WITH DIFFERENTIAL  BASIC METABOLIC PANEL  PREGNANCY, URINE  TROPONIN I   Imaging Review No results found.  EKG Interpretation   None       MDM  No diagnosis found.  Date: 03/03/2013  Rate: 66  Rhythm: normal sinus rhythm  QRS Axis: normal  Intervals: normal  ST/T Wave abnormalities: nonspecific ST changes  Conduction Disutrbances:none  Narrative Interpretation:   Old EKG Reviewed: changes noted   In the setting of > 8 hours of symptoms with a negative EKG and troponin ACS is excluded   PERC negative wells 0.  Doubt PE   Patient also reporting increased urination.  But urine is free of infection.  Is currently helping out family has recently lost a loved one and is under stress.  Suspect anxiety.  Follow up with your PMD for ongoing care will treat for hypokalemia  Yogesh Cominsky K Juliahna Wiswell-Rasch, MD 03/03/13 [phone]

## 2013-03-15 ENCOUNTER — Other Ambulatory Visit: Payer: Self-pay

## 2013-04-16 ENCOUNTER — Ambulatory Visit: Payer: Self-pay | Admitting: Internal Medicine

## 2013-07-29 ENCOUNTER — Emergency Department (HOSPITAL_BASED_OUTPATIENT_CLINIC_OR_DEPARTMENT_OTHER): Payer: Self-pay

## 2013-07-29 ENCOUNTER — Encounter (HOSPITAL_BASED_OUTPATIENT_CLINIC_OR_DEPARTMENT_OTHER): Payer: Self-pay | Admitting: Emergency Medicine

## 2013-07-29 ENCOUNTER — Emergency Department (HOSPITAL_BASED_OUTPATIENT_CLINIC_OR_DEPARTMENT_OTHER)
Admission: EM | Admit: 2013-07-29 | Discharge: 2013-07-29 | Disposition: A | Discharge status: Home / Self Care | Payer: Self-pay | Attending: Emergency Medicine | Admitting: Emergency Medicine

## 2013-07-29 DIAGNOSIS — R42 Dizziness and giddiness: Secondary | ICD-10-CM | POA: Insufficient documentation

## 2013-07-29 DIAGNOSIS — Z3202 Encounter for pregnancy test, result negative: Secondary | ICD-10-CM | POA: Insufficient documentation

## 2013-07-29 DIAGNOSIS — R002 Palpitations: Secondary | ICD-10-CM | POA: Insufficient documentation

## 2013-07-29 DIAGNOSIS — R000 Tachycardia, unspecified: Secondary | ICD-10-CM | POA: Insufficient documentation

## 2013-07-29 DIAGNOSIS — K769 Liver disease, unspecified: Secondary | ICD-10-CM

## 2013-07-29 DIAGNOSIS — D649 Anemia, unspecified: Secondary | ICD-10-CM | POA: Insufficient documentation

## 2013-07-29 DIAGNOSIS — R55 Syncope and collapse: Secondary | ICD-10-CM | POA: Insufficient documentation

## 2013-07-29 DIAGNOSIS — Z8742 Personal history of other diseases of the female genital tract: Secondary | ICD-10-CM | POA: Insufficient documentation

## 2013-07-29 DIAGNOSIS — Z79899 Other long term (current) drug therapy: Secondary | ICD-10-CM | POA: Insufficient documentation

## 2013-07-29 DIAGNOSIS — R209 Unspecified disturbances of skin sensation: Secondary | ICD-10-CM | POA: Insufficient documentation

## 2013-07-29 DIAGNOSIS — N63 Unspecified lump in unspecified breast: Secondary | ICD-10-CM

## 2013-07-29 DIAGNOSIS — R079 Chest pain, unspecified: Secondary | ICD-10-CM | POA: Insufficient documentation

## 2013-07-29 DIAGNOSIS — R928 Other abnormal and inconclusive findings on diagnostic imaging of breast: Secondary | ICD-10-CM | POA: Insufficient documentation

## 2013-07-29 DIAGNOSIS — K7689 Other specified diseases of liver: Secondary | ICD-10-CM | POA: Insufficient documentation

## 2013-07-29 DIAGNOSIS — Z91040 Latex allergy status: Secondary | ICD-10-CM | POA: Insufficient documentation

## 2013-07-29 DIAGNOSIS — IMO0002 Reserved for concepts with insufficient information to code with codable children: Secondary | ICD-10-CM | POA: Insufficient documentation

## 2013-07-29 DIAGNOSIS — Z8619 Personal history of other infectious and parasitic diseases: Secondary | ICD-10-CM | POA: Insufficient documentation

## 2013-07-29 DIAGNOSIS — R110 Nausea: Secondary | ICD-10-CM | POA: Insufficient documentation

## 2013-07-29 DIAGNOSIS — Z880 Allergy status to penicillin: Secondary | ICD-10-CM | POA: Insufficient documentation

## 2013-07-29 DIAGNOSIS — I10 Essential (primary) hypertension: Secondary | ICD-10-CM | POA: Insufficient documentation

## 2013-07-29 LAB — CBC WITH DIFFERENTIAL/PLATELET
BASOS PCT: 0 % (ref 0–1)
Basophils Absolute: 0 10*3/uL (ref 0.0–0.1)
EOS PCT: 1 % (ref 0–5)
Eosinophils Absolute: 0.1 10*3/uL (ref 0.0–0.7)
HCT: 45 % (ref 36.0–46.0)
Hemoglobin: 14.8 g/dL (ref 12.0–15.0)
Lymphocytes Relative: 36 % (ref 12–46)
Lymphs Abs: 2.7 10*3/uL (ref 0.7–4.0)
MCH: 27.4 pg (ref 26.0–34.0)
MCHC: 32.9 g/dL (ref 30.0–36.0)
MCV: 83.3 fL (ref 78.0–100.0)
MONO ABS: 1 10*3/uL (ref 0.1–1.0)
Monocytes Relative: 13 % — ABNORMAL HIGH (ref 3–12)
NEUTROS ABS: 3.7 10*3/uL (ref 1.7–7.7)
Neutrophils Relative %: 50 % (ref 43–77)
PLATELETS: 301 10*3/uL (ref 150–400)
RBC: 5.4 MIL/uL — ABNORMAL HIGH (ref 3.87–5.11)
RDW: 14.9 % (ref 11.5–15.5)
WBC: 7.4 10*3/uL (ref 4.0–10.5)

## 2013-07-29 LAB — COMPREHENSIVE METABOLIC PANEL
ALBUMIN: 4.3 g/dL (ref 3.5–5.2)
ALT: 11 U/L (ref 0–35)
AST: 20 U/L (ref 0–37)
Alkaline Phosphatase: 58 U/L (ref 39–117)
BUN: 9 mg/dL (ref 6–23)
CO2: 26 mEq/L (ref 19–32)
CREATININE: 0.8 mg/dL (ref 0.50–1.10)
Calcium: 9.9 mg/dL (ref 8.4–10.5)
Chloride: 98 mEq/L (ref 96–112)
GFR calc Af Amer: >90 mL/min (ref >90)
GFR calc non Af Amer: >90 mL/min (ref >90)
Glucose, Bld: 90 mg/dL (ref 70–99)
Potassium: 3.7 mEq/L (ref 3.7–5.3)
SODIUM: 140 meq/L (ref 137–147)
TOTAL PROTEIN: 8.8 g/dL — AB (ref 6.0–8.3)
Total Bilirubin: 0.5 mg/dL (ref 0.3–1.2)

## 2013-07-29 LAB — TROPONIN I: Troponin I: <0.3 ng/mL (ref <0.30)

## 2013-07-29 LAB — URINALYSIS, ROUTINE W REFLEX MICROSCOPIC
Bilirubin Urine: NEGATIVE
Glucose, UA: NEGATIVE mg/dL
Ketones, ur: 15 mg/dL — AB
NITRITE: NEGATIVE
Protein, ur: 30 mg/dL — AB
SPECIFIC GRAVITY, URINE: 1.009 (ref 1.005–1.030)
UROBILINOGEN UA: 1 mg/dL (ref 0.0–1.0)
pH: 6 (ref 5.0–8.0)

## 2013-07-29 LAB — URINE MICROSCOPIC-ADD ON

## 2013-07-29 LAB — PREGNANCY, URINE: PREG TEST UR: NEGATIVE

## 2013-07-29 LAB — D-DIMER, QUANTITATIVE (NOT AT ARMC): D-Dimer, Quant: 0.55 ug/mL-FEU — ABNORMAL HIGH (ref 0.00–0.48)

## 2013-07-29 MED ORDER — IOHEXOL 350 MG/ML SOLN
100.0000 mL | Freq: Once | INTRAVENOUS | Status: AC | PRN
  Administered 2013-07-29: 100 mL via INTRAVENOUS

## 2013-07-29 MED ORDER — SODIUM CHLORIDE 0.9 % IV BOLUS (SEPSIS)
1000.0000 mL | Freq: Once | INTRAVENOUS | Status: AC
  Administered 2013-07-29: 1000 mL via INTRAVENOUS

## 2013-07-29 NOTE — Discharge Instructions (Signed)
Palpitations  A palpitation is the feeling that your heartbeat is irregular or is faster than normal. It may feel like your heart is fluttering or skipping a beat. Palpitations are usually not a serious problem. However, in some cases, you may need further medical evaluation. CAUSES  Palpitations can be caused by:  Smoking.  Caffeine or other stimulants, such as diet pills or energy drinks.  Alcohol.  Stress and anxiety.  Strenuous physical activity.  Fatigue.  Certain medicines.  Heart disease, especially if you have a history of arrhythmias. This includes atrial fibrillation, atrial flutter, or supraventricular tachycardia.  An improperly working pacemaker or defibrillator. DIAGNOSIS  To find the cause of your palpitations, your caregiver will take your history and perform a physical exam. Tests may also be done, including:  Electrocardiography (ECG). This test records the heart's electrical activity.  Cardiac monitoring. This allows your caregiver to monitor your heart rate and rhythm in real time.  Holter monitor. This is a portable device that records your heartbeat and can help diagnose heart arrhythmias. It allows your caregiver to track your heart activity for several days, if needed.  Stress tests by exercise or by giving medicine that makes the heart beat faster. TREATMENT  Treatment of palpitations depends on the cause of your symptoms and can vary greatly. Most cases of palpitations do not require any treatment other than time, relaxation, and monitoring your symptoms. Other causes, such as atrial fibrillation, atrial flutter, or supraventricular tachycardia, usually require further treatment. HOME CARE INSTRUCTIONS   Avoid:  Caffeinated coffee, tea, soft drinks, diet pills, and energy drinks.  Chocolate.  Alcohol.  Stop smoking if you smoke.  Reduce your stress and anxiety. Things that can help you relax include:  A method that measures bodily functions so  you can learn to control them (biofeedback).  Yoga.  Meditation.  Physical activity such as swimming, jogging, or walking.  Get plenty of rest and sleep. SEEK MEDICAL CARE IF:   You continue to have a fast or irregular heartbeat beyond 24 hours.  Your palpitations occur more often. SEEK IMMEDIATE MEDICAL CARE IF:  You develop chest pain or shortness of breath.  You have a severe headache.  You feel dizzy, or you faint. MAKE SURE YOU:  Understand these instructions.  Will watch your condition.  Will get help right away if you are not doing well or get worse. Document Released: 04/23/2000 Document Revised: 08/21/2012 Document Reviewed: 06/25/2011 [facility] Patient Information 2014 [place]. Near-Syncope Near-syncope (commonly known as near fainting) is sudden weakness, dizziness, or feeling like you might pass out. During an episode of near-syncope, you may also develop pale skin, have tunnel vision, or feel sick to your stomach (nauseous). Near-syncope may occur when getting up after sitting or while standing for a long time. It is caused by a sudden decrease in blood flow to the brain. This decrease can result from various causes or triggers, most of which are not serious. However, because near-syncope can sometimes be a sign of something serious, a medical evaluation is required. The specific cause is often not determined. HOME CARE INSTRUCTIONS  Monitor your condition for any changes. The following actions may help to alleviate any discomfort you are experiencing:  Have someone stay with you until you feel stable.  Lie down right away if you start feeling like you might faint. Breathe deeply and steadily. Wait until all the symptoms have passed. Most of these episodes last only a few minutes. You may feel tired  for several hours.   Drink enough fluids to keep your urine clear or pale yellow.   If you are taking blood pressure or heart medicine, get up slowly when  seated or lying down. Take several minutes to sit and then stand. This can reduce dizziness.  Follow up with your health care provider as directed. SEEK IMMEDIATE MEDICAL CARE IF:   You have a severe headache.   You have unusual pain in the chest, abdomen, or back.   You are bleeding from the mouth or rectum, or you have black or tarry stool.   You have an irregular or very fast heartbeat.   You have repeated fainting or have seizure-like jerking during an episode.   You faint when sitting or lying down.   You have confusion.   You have difficulty walking.   You have severe weakness.   You have vision problems.  MAKE SURE YOU:   Understand these instructions.  Will watch your condition.  Will get help right away if you are not doing well or get worse. Document Released: 04/26/2005 Document Revised: 12/27/2012 Document Reviewed: 09/29/2012 [facility] Patient Information 2014 [place]. Chest Pain (Nonspecific) It is often hard to give a specific diagnosis for the cause of chest pain. There is always a chance that your pain could be related to something serious, such as a heart attack or a blood clot in the lungs. You need to follow up with your caregiver for further evaluation. CAUSES   Heartburn.  Pneumonia or bronchitis.  Anxiety or stress.  Inflammation around your heart (pericarditis) or lung (pleuritis or pleurisy).  A blood clot in the lung.  A collapsed lung (pneumothorax). It can develop suddenly on its own (spontaneous pneumothorax) or from injury (trauma) to the chest.  Shingles infection (herpes zoster virus). The chest wall is composed of bones, muscles, and cartilage. Any of these can be the source of the pain.  The bones can be bruised by injury.  The muscles or cartilage can be strained by coughing or overwork.  The cartilage can be affected by inflammation and become sore (costochondritis). DIAGNOSIS  Lab tests or other studies,  such as X-rays, electrocardiography, stress testing, or cardiac imaging, may be needed to find the cause of your pain.  TREATMENT   Treatment depends on what may be causing your chest pain. Treatment may include:  Acid blockers for heartburn.  Anti-inflammatory medicine.  Pain medicine for inflammatory conditions.  Antibiotics if an infection is present.  You may be advised to change lifestyle habits. This includes stopping smoking and avoiding alcohol, caffeine, and chocolate.  You may be advised to keep your head raised (elevated) when sleeping. This reduces the chance of acid going backward from your stomach into your esophagus.  Most of the time, nonspecific chest pain will improve within 2 to 3 days with rest and mild pain medicine. HOME CARE INSTRUCTIONS   If antibiotics were prescribed, take your antibiotics as directed. Finish them even if you start to feel better.  For the next few days, avoid physical activities that bring on chest pain. Continue physical activities as directed.  Do not smoke.  Avoid drinking alcohol.  Only take over-the-counter or prescription medicine for pain, discomfort, or fever as directed by your caregiver.  Follow your caregiver's suggestions for further testing if your chest pain does not go away.  Keep any follow-up appointments you made. If you do not go to an appointment, you could develop lasting (chronic) problems with pain.  If there is any problem keeping an appointment, you must call to reschedule. SEEK MEDICAL CARE IF:   You think you are having problems from the medicine you are taking. Read your medicine instructions carefully.  Your chest pain does not go away, even after treatment.  You develop a rash with blisters on your chest. SEEK IMMEDIATE MEDICAL CARE IF:   You have increased chest pain or pain that spreads to your arm, neck, jaw, back, or abdomen.  You develop shortness of breath, an increasing cough, or you are  coughing up blood.  You have severe back or abdominal pain, feel nauseous, or vomit.  You develop severe weakness, fainting, or chills.  You have a fever. THIS IS AN EMERGENCY. Do not wait to see if the pain will go away. Get medical help at once. Call your local emergency services (911 in [place] Do not drive yourself to the hospital. MAKE SURE YOU:   Understand these instructions.  Will watch your condition.  Will get help right away if you are not doing well or get worse. Document Released: 02/03/2005 Document Revised: 07/19/2011 Document Reviewed: 11/30/2007 [facility] Patient Information 2014 [place].

## 2013-07-29 NOTE — ED Notes (Signed)
Patient understands importance of outpatient f/u for cardiac issues/breast findings.

## 2013-07-29 NOTE — ED Provider Notes (Signed)
CSN: [id number]     Arrival date & time 07/29/13  1725 History  This chart was scribed for Neta Ehlers, MD by Maree Erie, ED Scribe. The patient was seen in room MH04/MH04. Patient's care was started at 8:25 PM.    Chief Complaint  Patient presents with  . Chest Pain     Patient is a 38 y.o. female presenting with chest pain. The history is provided by the patient. No language interpreter was used.  Chest Pain Pain quality: shooting   Onset quality:  Sudden Duration:  3 days Timing:  Intermittent Chronicity:  New Relieved by:  Nothing Ineffective treatments:  Antacids Associated symptoms: dizziness, nausea, numbness, palpitations and weakness   Associated symptoms: no abdominal pain, no back pain, no cough, no diaphoresis, no fatigue, no fever, no headache, no shortness of breath and not vomiting   Risk factors: no prior DVT/PE     HPI Comments: Teresa Hudson is a 38 y.o. female who presents to the Emergency Department complaining of intermittent, sudden onset left sided chest pain that began two days ago. She states that during the first episode she felt nauseous, dizzy and hot. She checked her HR which was in the 120s. She describes the pain as shooting and sharp. The episodes last a few seconds. She has associated palpitations described as her heart pounding, excessive burping, generalized weakness and intermittent mild numbness in her arms with some of the episodes. She believes she has had nine to twelve episodes of the palpitations that last about twenty minutes. He has been taking TUMS without relief. She reports a past medical history of anemia but denies ever needing transfusions for it. She denies a history of DVT/PE. She denies recent long distance travel. She denies smoking or drug use. She reports occasional alcohol use.    Past Medical History  Diagnosis Date  . Chlamydia   . Fibroids   . Anemia   . Fibroids   . LSIL (low grade squamous intraepithelial  lesion) on Pap smear 01/24/2012    Colpo 01/24/12  . Hypertension    Past Surgical History  Procedure Laterality Date  . Dilation and curettage of uterus  1999   Family History  Problem Relation Age of Onset  . Cancer Mother     breast  . Heart disease Mother   . Cancer Father     prostate  . Hypertension Father   . Asthma Sister   . Thyroid cancer Sister   . GI problems Sister    History  Substance Use Topics  . Smoking status: Never Smoker   . Smokeless tobacco: Never Used  . Alcohol Use: No   OB History   Grav Para Term Preterm Abortions TAB SAB Ect Mult Living   5 3 3  2 2    3      Review of Systems  Constitutional: Negative for fever, chills, diaphoresis, activity change, appetite change and fatigue.  HENT: Negative for congestion, facial swelling, rhinorrhea and sore throat.   Eyes: Negative for photophobia and discharge.  Respiratory: Negative for cough, chest tightness and shortness of breath.   Cardiovascular: Positive for chest pain and palpitations. Negative for leg swelling.  Gastrointestinal: Positive for nausea. Negative for vomiting, abdominal pain and diarrhea.  Endocrine: Negative for polydipsia and polyuria.  Genitourinary: Negative for dysuria, frequency, difficulty urinating and pelvic pain.  Musculoskeletal: Negative for arthralgias, back pain, neck pain and neck stiffness.  Skin: Negative for color change and wound.  Allergic/Immunologic:  Negative for immunocompromised state.  Neurological: Positive for dizziness, weakness and numbness. Negative for facial asymmetry and headaches.  Hematological: Does not bruise/bleed easily.  Psychiatric/Behavioral: Negative for confusion and agitation.      Allergies  Sulfa antibiotics; Aspirin; Latex; Penicillins; and Nickel  Home Medications   Current Outpatient Rx  Name  Route  Sig  Dispense  Refill  . hydrochlorothiazide (HYDRODIURIL) 25 MG tablet   Oral   Take 0.5 tablets (12.5 mg total) by mouth  daily.   30 tablet   3   . Multiple Vitamins-Minerals (MULTIVITAMIN WITH MINERALS) tablet   Oral   Take 1 tablet by mouth daily.         . potassium chloride SA (K-DUR,KLOR-CON) 20 MEQ tablet   Oral   Take 1 tablet (20 mEq total) by mouth 2 (two) times daily.   14 tablet   0   . cyclobenzaprine (FLEXERIL) 10 MG tablet   Oral   Take 1 tablet (10 mg total) by mouth 3 (three) times daily as needed for muscle spasms.   20 tablet   0   . predniSONE (DELTASONE) 10 MG tablet   Oral   Take 2 tablets (20 mg total) by mouth 2 (two) times daily.   20 tablet   0   . Prenat-FeFum-FePo-FA-Omega 3 (CONCEPT DHA PO)   Oral   Take 1 tablet by mouth daily.            Triage Vitals: BP 149/96  Pulse 122  Temp(Src) 98.5 F (36.9 C) (Oral)  Resp 18  Ht 5\' 4"  (1.626 m)  Wt 180 lb (81.647 kg)  BMI 30.88 kg/m2  SpO2 100%  LMP 07/29/2013  Physical Exam  Nursing note and vitals reviewed. Constitutional: She is oriented to person, place, and time. She appears well-developed and well-nourished. No distress.  HENT:  Head: Normocephalic.  Mouth/Throat: Oropharynx is clear and moist.  Eyes: Pupils are equal, round, and reactive to light.  Neck: Neck supple.  Cardiovascular: Regular rhythm and normal heart sounds.  Tachycardia present.   Pulmonary/Chest: Effort normal. No respiratory distress. She has no wheezes. She has no rales.  Abdominal: Soft. She exhibits no distension. There is no tenderness. There is no rebound and no guarding.  Musculoskeletal: She exhibits no edema and no tenderness.  Neurological: She is alert and oriented to person, place, and time.  Skin: Skin is warm and dry.  Psychiatric: She has a normal mood and affect.    ED Course  Procedures (including critical care time)  DIAGNOSTIC STUDIES: Oxygen Saturation is 100% on room air, normal by my interpretation.    COORDINATION OF CARE: 8:43 PM -Discussed x-ray and lab results with patient. Will order D-dimer,  orthostatics and IV fluids. Patient verbalizes understanding and agrees with treatment plan.    Labs Review Labs Reviewed  URINALYSIS, ROUTINE W REFLEX MICROSCOPIC - Abnormal; Notable for the following:    Hgb urine dipstick LARGE (*)    Ketones, ur 15 (*)    Protein, ur 30 (*)    Leukocytes, UA TRACE (*)    All other components within normal limits  CBC WITH DIFFERENTIAL - Abnormal; Notable for the following:    RBC 5.40 (*)    Monocytes Relative 13 (*)    All other components within normal limits  COMPREHENSIVE METABOLIC PANEL - Abnormal; Notable for the following:    Total Protein 8.8 (*)    All other components within normal limits  URINE MICROSCOPIC-ADD ON - Abnormal;  Notable for the following:    Bacteria, UA FEW (*)    All other components within normal limits  D-DIMER, QUANTITATIVE - Abnormal; Notable for the following:    D-Dimer, Quant 0.55 (*)    All other components within normal limits  PREGNANCY, URINE  TROPONIN I   Imaging Review Dg Chest 2 View  07/29/2013   CLINICAL DATA:  Chest pain and heaviness, belching, heart pounding with exertion, history hypertension  EXAM: CHEST  2 VIEW  COMPARISON:  03/03/2013  FINDINGS: Normal heart size, mediastinal contours, and pulmonary vascularity.  Lungs clear.  No pleural effusion or pneumothorax.  Bones unremarkable.  IMPRESSION: Normal exam.   Electronically Signed   By: Lavonia Dana M.D.   On: 07/29/2013 19:03   Ct Angio Chest Pe W/cm &/or Wo Cm  07/29/2013   CLINICAL DATA:  Left-sided chest pain for the past 2 days. Nausea, dizziness and palpitations.  EXAM: CT ANGIOGRAPHY CHEST WITH CONTRAST  TECHNIQUE: Multidetector CT imaging of the chest was performed using the standard protocol during bolus administration of intravenous contrast. Multiplanar CT image reconstructions and MIPs were obtained to evaluate the vascular anatomy.  CONTRAST:  153mL OMNIPAQUE IOHEXOL 350 MG/ML SOLN  COMPARISON:  No priors.  FINDINGS: Mediastinum:  There are no filling defects within the pulmonary arterial tree to suggest underlying pulmonary embolism. Heart size is normal. There is no significant pericardial fluid, thickening or pericardial calcification. No pathologically enlarged mediastinal or hilar lymph nodes. The esophagus is unremarkable in appearance.  Lungs/Pleura: No acute consolidative airspace disease. No pleural effusions. No suspicious appearing pulmonary nodules or masses. No pneumothorax.  Upper Abdomen: In the periphery of segment 8 of the liver there is a well-defined 2.2 x 1.7 cm hypervascular focus which is incompletely characterized on today's examination. Otherwise, unremarkable.  Musculoskeletal: Well-defined 1.5 cm intermediate attenuation lesion in the central aspect of the left breast (image 46 of series 4). No associated axillary lymphadenopathy. There are no aggressive appearing lytic or blastic lesions noted in the visualized portions of the skeleton.  Review of the MIP images confirms the above findings.  IMPRESSION: 1. No evidence of pulmonary embolism. 2. No acute findings in the thorax to account for the patient's symptoms. 3. 2.2 x 1.7 cm hypervascular lesion in segment 8 of the liver is favored to be a benign lesion such as a flash fill cavernous hemangioma, focal nodular hyperplasia ([place]), or a hepatic adenoma. Aggressive lesions such as a primary hepatocellular carcinoma or hypervascular metastasis are less likely in this young patient, without obvious signs of cirrhosis, unless there is an known history of primary malignancy such as melanoma. This could be definitively characterized with nonemergent MRI of the abdomen with and without IV gadolinium if of clinical concern. 4. Well-defined 1.5 cm soft tissue attenuation lesion in the central aspect of the left breast is nonspecific. Statistically, this is favored to represent a small fibroadenoma, however, correlation with nonemergent mammogram and/or breast ultrasound is  recommended in the near future.   Electronically Signed   By: Vinnie Langton M.D.   On: 07/29/2013 23:21     EKG Interpretation   Date/Time:  Sunday July 29 2013 17:31:01 EDT Ventricular Rate:  116 PR Interval:  130 QRS Duration: 76 QT Interval:  302 QTC Calculation: 419 R Axis:   1 Text Interpretation:  Sinus tachycardia Otherwise normal ECG Confirmed by  DOCHERTY  MD, MEGAN ([id number]) on 07/29/2013 5:42:19 PM      MDM   Final diagnoses:  Chest pain  Palpitations  Near syncope  Breast nodule  Liver lesion    Pt is a 38 y.o. female with Pmhx as above who presents with 3 days of episodic lightheadedness, palpitations, SOB as well as several episodes of sharp, shooting pains across chest lasting seconds to mins.  On PE, Pt tachycardic, otherwise stable. No symptoms currently.  Cardiopulm exam benign except for tachycardic. No acute ischemic changes on CXR.  Delta trop negative. D-dimer mildly elevated promting CTA chest to r/o PE. No PE seen, but pt did have two incidentally findings which were discussed, liver lesions which is like a hemangioma, and a L breast nodule which she can schedule outpt mamogram for further eval.  Doubt cardiogenic cause of symptoms, but feel she is safe to f/u with PCP for possible holter monitor, thyroid studies. Return precautions given for new or worsening symptoms including worsening pain, SOB, passing out.        I personally performed the services described in this documentation, which was scribed in my presence. The recorded information has been reviewed and is accurate.     Neta Ehlers, MD 07/30/13 [phone]

## 2013-07-29 NOTE — ED Notes (Signed)
Pt reports Chest pain "heaviness" and belching since Friday- Heart pounding with exertion

## 2013-08-03 ENCOUNTER — Ambulatory Visit: Payer: Self-pay | Attending: Internal Medicine | Admitting: Internal Medicine

## 2013-08-03 VITALS — BP 129/86 | HR 104 | Temp 98.4°F | Resp 20 | Ht 65.0 in | Wt 177.0 lb

## 2013-08-03 DIAGNOSIS — Z0000 Encounter for general adult medical examination without abnormal findings: Secondary | ICD-10-CM

## 2013-08-03 DIAGNOSIS — D134 Benign neoplasm of liver: Secondary | ICD-10-CM | POA: Insufficient documentation

## 2013-08-03 DIAGNOSIS — I10 Essential (primary) hypertension: Secondary | ICD-10-CM

## 2013-08-03 DIAGNOSIS — D135 Benign neoplasm of extrahepatic bile ducts: Principal | ICD-10-CM

## 2013-08-03 DIAGNOSIS — D249 Benign neoplasm of unspecified breast: Secondary | ICD-10-CM

## 2013-08-03 NOTE — Patient Instructions (Signed)
Mammography  Mammography is an X-ray of the breasts to look for changes that are not normal. The X-ray image is called a mammogram. This procedure can screen for breast cancer, can detect cancer early, and can diagnose cancer.   LET YOUR CAREGIVER KNOW ABOUT:  · Breast implants.  · Previous breast disease, biopsy, or surgery.  · If you are breastfeeding.  · Medicines taken, including vitamins, herbs, eyedrops, over-the-counter medicines, and creams.  · Use of steroids (by mouth or creams).  · Possibility of pregnancy, if this applies.  RISKS AND COMPLICATIONS  · Exposure to radiation, but at very low levels.  · The results may be misinterpreted.  · The results may not be accurate.  · Mammography may lead to further tests.  · Mammography may not catch certain cancers.  BEFORE THE PROCEDURE  · Schedule your test about 7 days after your menstrual period. This is when your breasts are the least tender and have signs of hormone changes.  · If you have had a mammography done at a different facility in the past, get the mammogram X-rays or have them sent to your current exam facility in order to compare them.  · Wash your breasts and under your arms the day of the test.  · Do not wear deodorants, perfumes, or powders anywhere on your body.  · Wear clothes that you can change in and out of easily.  PROCEDURE  Relax as much as possible during the test. Any discomfort during the test will be very brief. The test should take less than 30 minutes. The following will happen:  · You will undress from the waist up and put on a gown.  · You will stand in front of the X-ray machine.  · Each breast will be placed between 2 plastic or glass plates. The plates will compress your breast for a few seconds.  · X-rays will be taken from different angles of the breast.  AFTER THE PROCEDURE  · The mammogram will be examined.  · Depending on the quality of the images, you may need to repeat certain parts of the test.  · Ask when your test  results will be ready. Make sure you get your test results.  · You may resume normal activities.  Document Released: 04/23/2000 Document Revised: 07/19/2011 Document Reviewed: 02/14/2011  [employer]® Patient Information ©2014 [employer].

## 2013-08-03 NOTE — Progress Notes (Signed)
Patient ID: Teresa Hudson, female   DOB: 02-Nov-1975, 38 y.o.   MRN: [id number]  CC: follow up from hospitalization   HPI: 38 year old female with  past medical history of hypertension who presented to clinic for followup after recent hospitalization for symptoms of weakness, and numbness in legs and fatigue. Patient had CT scan done which was concerning for liver lesion as well as a nodule seen in the breast. These are most likely hemangioma and fibroadenoma respectively as per CT scan interpretation. Patient came to followup on these results.   Allergies  Allergen Reactions  . Sulfa Antibiotics Anaphylaxis  . Aspirin Swelling  . Latex Itching and Swelling  . Penicillins Swelling    Facial swelling  . Nickel Rash   Past Medical History  Diagnosis Date  . Chlamydia   . Fibroids   . Anemia   . Fibroids   . LSIL (low grade squamous intraepithelial lesion) on Pap smear 01/24/2012    Colpo 01/24/12  . Hypertension    Current Outpatient Prescriptions on File Prior to Visit  Medication Sig Dispense Refill  . hydrochlorothiazide (HYDRODIURIL) 25 MG tablet Take 0.5 tablets (12.5 mg total) by mouth daily.  30 tablet  3  . Multiple Vitamins-Minerals (MULTIVITAMIN WITH MINERALS) tablet Take 1 tablet by mouth daily.      . potassium chloride SA (K-DUR,KLOR-CON) 20 MEQ tablet Take 1 tablet (20 mEq total) by mouth 2 (two) times daily.  14 tablet  0   No current facility-administered medications on file prior to visit.   Family History  Problem Relation Age of Onset  . Cancer Mother     breast  . Heart disease Mother   . Cancer Father     prostate  . Hypertension Father   . Asthma Sister   . Thyroid cancer Sister   . GI problems Sister    History   Social History  . Marital Status: Single    Spouse Name: N/A    Number of Children: N/A  . Years of Education: N/A   Occupational History  . Not on file.   Social History Main Topics  . Smoking status: Never Smoker   . Smokeless  tobacco: Never Used  . Alcohol Use: No  . Drug Use: No  . Sexual Activity: Yes    Birth Control/ Protection: None   Other Topics Concern  . Not on file   Social History Narrative  . No narrative on file    Review of Systems  Constitutional: Negative for fever, chills, diaphoresis, activity change, appetite change and fatigue.  HENT: Negative for ear pain, nosebleeds, congestion, facial swelling, rhinorrhea, neck pain, neck stiffness and ear discharge.   Eyes: Negative for pain, discharge, redness, itching and visual disturbance.  Respiratory: Negative for cough, choking, chest tightness, shortness of breath, wheezing and stridor.   Cardiovascular: Negative for chest pain, palpitations and leg swelling.  Gastrointestinal: Negative for abdominal distention.  Genitourinary: Negative for dysuria, urgency, frequency, hematuria, flank pain, decreased urine volume, difficulty urinating and dyspareunia.  Musculoskeletal: Negative for back pain, joint swelling, arthralgias and gait problem.  Neurological: Negative for dizziness, tremors, seizures, syncope, facial asymmetry, speech difficulty, weakness, light-headedness, numbness and headaches.  Hematological: Negative for adenopathy. Does not bruise/bleed easily.  Psychiatric/Behavioral: Negative for hallucinations, behavioral problems, confusion, dysphoric mood, decreased concentration and agitation.    Objective:   Filed Vitals:   08/03/13 1143  BP: 129/86  Pulse: 104  Temp: 98.4 F (36.9 C)  Resp: 20  Physical Exam  Constitutional: Appears well-developed and well-nourished. No distress.  HENT: Normocephalic. External right and left ear normal. Oropharynx is clear and moist.  Eyes: Conjunctivae and EOM are normal. PERRLA, no scleral icterus.  Neck: Normal ROM. Neck supple. No JVD. No tracheal deviation. No thyromegaly.  CVS: RRR, S1/S2 +, no murmurs, no gallops, no carotid bruit.  Pulmonary: Effort and breath sounds normal, no  stridor, rhonchi, wheezes, rales.  Abdominal: Soft. BS +,  no distension, tenderness, rebound or guarding.  Musculoskeletal: Normal range of motion. No edema and no tenderness.  Lymphadenopathy: No lymphadenopathy noted, cervical, inguinal. Neuro: Alert. Normal reflexes, muscle tone coordination. No cranial nerve deficit. Skin: Skin is warm and dry. No rash noted. Not diaphoretic. No erythema. No pallor.  Psychiatric: Normal mood and affect. Behavior, judgment, thought content normal.   Lab Results  Component Value Date   WBC 7.4 07/29/2013   HGB 14.8 07/29/2013   HCT 45.0 07/29/2013   MCV 83.3 07/29/2013   PLT 301 07/29/2013   Lab Results  Component Value Date   CREATININE 0.80 07/29/2013   BUN 9 07/29/2013   NA 140 07/29/2013   K 3.7 07/29/2013   CL 98 07/29/2013   CO2 26 07/29/2013    No results found for this basename: HGBA1C   Lipid Panel  No results found for this basename: chol, trig, hdl, cholhdl, vldl, ldlcalc       Assessment and plan:   Patient Active Problem List   Diagnosis Date Noted  . 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 Hypertension 12/05/2012    Priority: Medium - - We have discussed target BP range - I have advised pt to check BP regularly and to call us back if the numbers are higher than 140/90 - discussed the importance of compliance with medical therapy and diet      Hemangioma  - Small liver lesions seen on CT scan. Patient will need MRI and about 6 months to re-evaluate the lesion

## 2013-08-03 NOTE — Progress Notes (Signed)
Patient here for follow-up after ED visit on 07/29/13.  Patient went to ED on 07/29/13 for dizziness, and heaviness of limbs. Patient asked to follow-up as CT scan showed left breast nodule and liver lesion. LMP-07/29/13.

## 2013-08-09 ENCOUNTER — Ambulatory Visit: Payer: Self-pay | Admitting: Internal Medicine

## 2013-08-16 ENCOUNTER — Ambulatory Visit (HOSPITAL_COMMUNITY): Payer: Self-pay

## 2013-10-11 ENCOUNTER — Other Ambulatory Visit (HOSPITAL_COMMUNITY): Payer: Self-pay | Admitting: *Deleted

## 2013-10-11 DIAGNOSIS — N6320 Unspecified lump in the left breast, unspecified quadrant: Secondary | ICD-10-CM

## 2013-10-16 ENCOUNTER — Ambulatory Visit (HOSPITAL_COMMUNITY)
Admission: RE | Admit: 2013-10-16 | Discharge: 2013-10-16 | Disposition: A | Discharge status: Home / Self Care | Payer: Self-pay | Source: Ambulatory Visit | Attending: Obstetrics and Gynecology | Admitting: Obstetrics and Gynecology

## 2013-10-16 ENCOUNTER — Encounter (HOSPITAL_COMMUNITY): Payer: Self-pay

## 2013-10-16 VITALS — BP 120/78 | Temp 98.9°F | Ht 64.0 in | Wt 179.2 lb

## 2013-10-16 DIAGNOSIS — N898 Other specified noninflammatory disorders of vagina: Secondary | ICD-10-CM

## 2013-10-16 DIAGNOSIS — Z01419 Encounter for gynecological examination (general) (routine) without abnormal findings: Secondary | ICD-10-CM

## 2013-10-17 DIAGNOSIS — N898 Other specified noninflammatory disorders of vagina: Secondary | ICD-10-CM | POA: Insufficient documentation

## 2013-10-17 NOTE — Patient Instructions (Signed)
Taught Teresa Hudson how to perform BSE and gave educational materials to take home. Let patient know that she will need her next Pap smear in one year if today's Pap smear is normal due to a history of an abnormal Pap and needing three normal Pap smears in a row per recommendation. Referred patient to the [facility] for diagnostic mammogram per recommendation. Appointment scheduled for Wednesday, October 24, 2013 at 1230. Patient aware of appointment and will be there. Let patient know will follow up with her within the next couple weeks with results of Pap smear by phone. [name] verbalized understanding.  [place]ann[place], Arvil Chaco, RN 3:25 PM

## 2013-10-17 NOTE — Progress Notes (Signed)
Patient referred to [facility] by [facility] due to having a CT scan on 07/29/2013 that showed a questionable area in her left breast.  Pap Smear:  Pap smear completed today. Patients last Pap smear was 11/01/2012 at the [facility] and normal. Patient has a history of an abnormal Pap smear 05/06/2011 that was LSIL and a follow colposcopy was completed 01/24/2012 that was normal. Last Pap smear and colposcopy results are in EPIC.  Physical exam: Breasts Breasts symmetrical. No skin abnormalities bilateral breasts. No nipple retraction bilateral breasts. No nipple discharge bilateral breasts. No lymphadenopathy. No lumps palpated bilateral breasts. No complaints of pain or tenderness on exam. Referred patient to the [facility] for diagnostic mammogram per recommendation. Appointment scheduled for Wednesday, October 24, 2013 at 1230.           Pelvic/Bimanual   Ext Genitalia No lesions, no swelling and no discharge observed on external genitalia.         Vagina Vagina pink and normal texture. No lesions and thick white vaginal discharge observed in vagina. Positive whiff test. Wet prep completed.          Cervix Cervix is present. Cervix pink and of normal texture. Thick white discharge observed on cervix.    Uterus Uterus is present and palpable. Uterus in normal position and normal size.        Adnexae Bilateral ovaries present and palpable. No tenderness on palpation.          Rectovaginal No rectal exam completed today since patient had no rectal complaints. No skin abnormalities observed on exam.

## 2013-10-18 LAB — CYTOLOGY - PAP

## 2013-10-24 ENCOUNTER — Ambulatory Visit
Admission: RE | Admit: 2013-10-24 | Discharge: 2013-10-24 | Disposition: A | Discharge status: Home / Self Care | Payer: No Typology Code available for payment source | Source: Ambulatory Visit | Attending: Obstetrics and Gynecology | Admitting: Obstetrics and Gynecology

## 2013-10-24 DIAGNOSIS — N6320 Unspecified lump in the left breast, unspecified quadrant: Secondary | ICD-10-CM

## 2013-10-31 ENCOUNTER — Telehealth (HOSPITAL_COMMUNITY): Payer: Self-pay | Admitting: *Deleted

## 2013-10-31 ENCOUNTER — Other Ambulatory Visit (HOSPITAL_COMMUNITY): Payer: Self-pay | Admitting: *Deleted

## 2013-10-31 DIAGNOSIS — N760 Acute vaginitis: Principal | ICD-10-CM

## 2013-10-31 DIAGNOSIS — B9689 Other specified bacterial agents as the cause of diseases classified elsewhere: Secondary | ICD-10-CM

## 2013-10-31 MED ORDER — METRONIDAZOLE 500 MG PO TABS: 500.0000 mg | ORAL_TABLET | Freq: Two times a day (BID) | ORAL | Status: DC

## 2013-10-31 NOTE — Telephone Encounter (Signed)
Telephoned patient at home # and discussed normal pap smear results. Next pap smear due in one year. Advised patient wet prep results showed bacterial vaginosis. Med called in to pharmacy. Patient voiced understanding.

## 2013-11-06 ENCOUNTER — Ambulatory Visit: Payer: Self-pay | Admitting: Internal Medicine

## 2014-01-29 ENCOUNTER — Ambulatory Visit: Payer: Self-pay | Attending: Family Medicine | Admitting: Family Medicine

## 2014-01-29 ENCOUNTER — Encounter: Payer: Self-pay | Admitting: Family Medicine

## 2014-01-29 VITALS — BP 130/80 | HR 114 | Temp 98.4°F | Resp 18 | Ht 64.0 in | Wt 178.0 lb

## 2014-01-29 DIAGNOSIS — R5381 Other malaise: Secondary | ICD-10-CM

## 2014-01-29 DIAGNOSIS — R5382 Chronic fatigue, unspecified: Secondary | ICD-10-CM

## 2014-01-29 DIAGNOSIS — R5383 Other fatigue: Secondary | ICD-10-CM | POA: Insufficient documentation

## 2014-01-29 DIAGNOSIS — R42 Dizziness and giddiness: Secondary | ICD-10-CM | POA: Insufficient documentation

## 2014-01-29 DIAGNOSIS — K7689 Other specified diseases of liver: Secondary | ICD-10-CM

## 2014-01-29 DIAGNOSIS — K769 Liver disease, unspecified: Secondary | ICD-10-CM | POA: Insufficient documentation

## 2014-01-29 DIAGNOSIS — I10 Essential (primary) hypertension: Secondary | ICD-10-CM

## 2014-01-29 LAB — TSH: TSH: 1.179 u[IU]/mL (ref 0.350–4.500)

## 2014-01-29 MED ORDER — HYDROCHLOROTHIAZIDE 25 MG PO TABS: 12.5000 mg | ORAL_TABLET | Freq: Every day | ORAL | Status: DC

## 2014-01-29 NOTE — Progress Notes (Signed)
   Subjective:    Patient ID: Teresa Hudson, female    DOB: 18-Nov-1975, 38 y.o.   MRN: [id number] CC: f.u liver lesion and  HPI 38 year old female presents for followup visit discussed the following:  #1 liver lesion: noted on CT chest obtained for PE in 07/2013. Lesion is small 1.7 cm x 2.2 cm and favors benign based on report. Patient has no history of RUQ pain, alcohol abuse, viral hepatitis, drug induced hepatitis.   #2 hypertension: complaint with HCTZ. BP a bit elevated on initial check then normal on repeat.   #3 would like thyroid checked: previously had dizziness. Was concerned that it could be due to hypothyroidism. Reports trouble losing weight, thinng hair and dry skin. Denies heat/cold intolerance, edema, easy bruising.   Social history: Chronic nonsmoker Review of Systems As per history of present illness    Objective:   Physical Exam BP 130/80  Pulse 114  Temp(Src) 98.4 F (36.9 C) (Oral)  Resp 18  Ht 5\' 4"  (1.626 m)  Wt 178 lb (80.74 kg)  BMI 30.54 kg/m2  SpO2 100%  LMP 01/08/2014 General appearance: alert, cooperative and no distress Ears: normal TM's and external ear canals both ears Nose: no discharge, turbinates pink, swollen Throat: lips, mucosa, and tongue normal; teeth and gums normal, swollen tonsils  Neck: mild anterior cervical adenopathy, supple, symmetrical, trachea midline and thyroid not enlarged, symmetric, no tenderness/mass/nodules Lungs: clear to auscultation bilaterally Heart: regular rate and rhythm, S1, S2 normal, no murmur, click, rub or gallop Extremities: extremities normal, atraumatic, no cyanosis or edema    Assessment & Plan:

## 2014-01-29 NOTE — Assessment & Plan Note (Signed)
A: favors benign, patient low risk. We discussed options P: F/u MRI only for new onset symptoms. No needed now.

## 2014-01-29 NOTE — Assessment & Plan Note (Signed)
A: BP well controlled P: continue HCTZ, refilled

## 2014-01-29 NOTE — Assessment & Plan Note (Signed)
A: for history of dizziness and other vague symptoms P: TSH Vit D

## 2014-01-29 NOTE — Patient Instructions (Addendum)
[name],  Thank you for coming in today. It was a pleasure meeting you.  1. Checking TSH and vitamin D today, you will be called with results. 2. BP a bit elevated, better or recheck, continue HCTZ 12.5 mg daily if elevated increase to 25 mg daily.   3. Regarding liver lesion, favor to be benign, we will make a note of it and f/u with MRI if you ever develop symptoms  4. 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   F/u in 3 months   Dr. Adrian Blackwater

## 2014-01-29 NOTE — Progress Notes (Signed)
Pt stated need referral for ct liver Has ? About thyroid

## 2014-01-30 LAB — BASIC METABOLIC PANEL
BUN: 9 mg/dL (ref 6–23)
CALCIUM: 9.6 mg/dL (ref 8.4–10.5)
CO2: 25 meq/L (ref 19–32)
Chloride: 103 mEq/L (ref 96–112)
Creat: 0.85 mg/dL (ref 0.50–1.10)
GLUCOSE: 83 mg/dL (ref 70–99)
Potassium: 4.1 mEq/L (ref 3.5–5.3)
SODIUM: 140 meq/L (ref 135–145)

## 2014-01-30 LAB — VITAMIN D 25 HYDROXY (VIT D DEFICIENCY, FRACTURES): Vit D, 25-Hydroxy: 36 ng/mL (ref 30–89)

## 2014-02-06 ENCOUNTER — Telehealth: Payer: Self-pay | Admitting: *Deleted

## 2014-02-06 NOTE — Telephone Encounter (Signed)
Message copied by Betti Cruz on Wed Feb 06, 2014  5:27 PM ------      Message from: Teresa Hudson      Created: Wed Jan 30, 2014  3:06 PM       Normal TSH, Vit D, BMP ------

## 2014-02-06 NOTE — Telephone Encounter (Signed)
Pt aware of lab results 

## 2014-02-13 ENCOUNTER — Telehealth: Payer: Self-pay | Admitting: Family Medicine

## 2014-02-13 NOTE — Telephone Encounter (Signed)
Pt. Called asking for appointment, pt. Was asked if she could come in 02/14/14 at 9:30 and stated she would not be able to...pt. Teresa Hudson she would call back tommorrow

## 2014-03-11 ENCOUNTER — Encounter: Payer: Self-pay | Admitting: Family Medicine

## 2014-07-23 ENCOUNTER — Telehealth (HOSPITAL_COMMUNITY): Payer: Self-pay | Admitting: *Deleted

## 2014-07-23 NOTE — Telephone Encounter (Signed)
Attempted to call patient to let her know she is past due for her follow-up at the [facility]. No one answered the phone. Left voicemail for patient to call me back.

## 2014-07-26 ENCOUNTER — Telehealth (HOSPITAL_COMMUNITY): Payer: Self-pay | Admitting: *Deleted

## 2014-07-26 NOTE — Telephone Encounter (Signed)
Patient returned my phone call. Called patient back and explained to her she is overdue for follow-up at the [facility]. [facility]. Let her know her [facility] card expires 10/16/13 and she is due for her Pap smear at that time. Told patient to call Sabrina to schedule appointment at [facility]. Patient verbalized understanding.

## 2014-09-06 ENCOUNTER — Other Ambulatory Visit: Payer: Self-pay | Admitting: Obstetrics and Gynecology

## 2014-09-06 DIAGNOSIS — N6320 Unspecified lump in the left breast, unspecified quadrant: Secondary | ICD-10-CM

## 2014-09-06 DIAGNOSIS — Z803 Family history of malignant neoplasm of breast: Secondary | ICD-10-CM

## 2014-09-11 ENCOUNTER — Ambulatory Visit
Admission: RE | Admit: 2014-09-11 | Discharge: 2014-09-11 | Disposition: A | Discharge status: Home / Self Care | Payer: No Typology Code available for payment source | Source: Ambulatory Visit | Attending: Obstetrics and Gynecology | Admitting: Obstetrics and Gynecology

## 2014-09-11 DIAGNOSIS — Z803 Family history of malignant neoplasm of breast: Secondary | ICD-10-CM

## 2014-09-11 DIAGNOSIS — N6320 Unspecified lump in the left breast, unspecified quadrant: Secondary | ICD-10-CM

## 2014-10-18 ENCOUNTER — Encounter: Payer: Self-pay | Admitting: Family Medicine

## 2014-10-18 ENCOUNTER — Ambulatory Visit: Payer: Self-pay | Attending: Family Medicine | Admitting: Family Medicine

## 2014-10-18 VITALS — BP 131/94 | HR 86 | Temp 98.0°F | Resp 16 | Wt 180.6 lb

## 2014-10-18 DIAGNOSIS — R42 Dizziness and giddiness: Secondary | ICD-10-CM | POA: Insufficient documentation

## 2014-10-18 DIAGNOSIS — J302 Other seasonal allergic rhinitis: Secondary | ICD-10-CM | POA: Insufficient documentation

## 2014-10-18 DIAGNOSIS — E559 Vitamin D deficiency, unspecified: Secondary | ICD-10-CM | POA: Insufficient documentation

## 2014-10-18 DIAGNOSIS — I10 Essential (primary) hypertension: Secondary | ICD-10-CM | POA: Insufficient documentation

## 2014-10-18 DIAGNOSIS — J309 Allergic rhinitis, unspecified: Secondary | ICD-10-CM | POA: Insufficient documentation

## 2014-10-18 LAB — POCT GLYCOSYLATED HEMOGLOBIN (HGB A1C): Hemoglobin A1C: 5.4

## 2014-10-18 LAB — CBC
HEMATOCRIT: 38.2 % (ref 36.0–46.0)
Hemoglobin: 12.1 g/dL (ref 12.0–15.0)
MCH: 26.1 pg (ref 26.0–34.0)
MCHC: 31.7 g/dL (ref 30.0–36.0)
MCV: 82.3 fL (ref 78.0–100.0)
MPV: 9.9 fL (ref 8.6–12.4)
Platelets: 308 10*3/uL (ref 150–400)
RBC: 4.64 MIL/uL (ref 3.87–5.11)
RDW: 15.7 % — ABNORMAL HIGH (ref 11.5–15.5)
WBC: 5.1 10*3/uL (ref 4.0–10.5)

## 2014-10-18 LAB — GLUCOSE, POCT (MANUAL RESULT ENTRY): POC GLUCOSE: 114 mg/dL — AB (ref 70–99)

## 2014-10-18 MED ORDER — CETIRIZINE HCL 10 MG PO TABS: 10.0000 mg | ORAL_TABLET | Freq: Every day | ORAL | Status: DC

## 2014-10-18 NOTE — Assessment & Plan Note (Signed)
  1. Dizziness: I suspect allergic rhinits Start zyrtec 10 mg daily  Checking Vit D and CBC Orthostatic vital signs are normal  Blood sugar is normal

## 2014-10-18 NOTE — Progress Notes (Signed)
   Subjective:    Patient ID: Teresa Hudson, female    DOB: 1976-04-14, 39 y.o.   MRN: [id number] CC: HTN, dizziness  HPI  1. HTN: no medication x 3 months. No HA, CP, SOB, swelling. Eating a low salt diet. Dizzy.  3. Dizziness: off and one x 2 months. Sitting and standing. Seeing spots in front of eyes. Having pressure around eyes and temples. No fever, chills, head trauma, bleeding, syncope.   Soc Hx: non smoker  Review of Systems  Constitutional: Negative for fever and chills.  Respiratory: Negative for shortness of breath.   Cardiovascular: Negative for chest pain, palpitations and leg swelling.  Neurological: Positive for dizziness. Negative for syncope.       Objective:   Physical Exam BP 131/94 mmHg  Pulse 86  Temp(Src) 98 F (36.7 C)  Resp 16  Wt 180 lb 9.6 oz (81.92 kg)  SpO2 100%  BP Readings from Last 3 Encounters:  10/18/14 131/94  01/29/14 130/80  10/16/13 120/78    Orthostatic VS reviewed and normal  General appearance: alert, cooperative and no distress Head: Normocephalic, without obvious abnormality, atraumatic Eyes: conjunctivae/corneas clear. PERRL, EOM's intact.  Ears: normal TM's and external ear canals both ears Nose: no discharge, turbinates swollen Throat: lips, mucosa, and tongue normal; teeth and gums normal Lungs: clear to auscultation bilaterally Heart: regular rate and rhythm, S1, S2 normal, no murmur, click, rub or gallop   Lab Results  Component Value Date   HGBA1C 5.40 10/18/2014   CBG 114    Assessment & Plan:

## 2014-10-18 NOTE — Assessment & Plan Note (Signed)
  2. HTN: Off of BP medicine your pressure is good today Eat a DASH diet Close f/u in 3 weeks to recheck BP if needed we will restart HCTZ at 12.5 mg daily

## 2014-10-18 NOTE — Progress Notes (Signed)
Patient states she is here because she has been feeling dizzy on and off  For the past two weeks Patient has not been taking the blood pressure medication Patient states she ran out of the medication and never got it refilled

## 2014-10-18 NOTE — Patient Instructions (Addendum)
Teresa Hudson,  Thank you for coming in today  1. Dizziness: I suspect allergic rhinits Start zyrtec 10 mg daily  Checking Vit D and CBC Orthostatic vital signs are normal  Blood sugar is normal    2. HTN: Off of BP medicine your pressure is good today Eat a DASH diet Close f/u in 3 weeks to recheck BP if needed we will restart HCTZ at 12.5 mg daily   F/u in 3 weeks with RN for BP check F/u with me in 3 months for hypertension    Dr. Adrian Blackwater   DASH Eating Plan DASH stands for "Dietary Approaches to Stop Hypertension." The DASH eating plan is a healthy eating plan that has been shown to reduce high blood pressure (hypertension). Additional health benefits may include reducing the risk of type 2 diabetes mellitus, heart disease, and stroke. The DASH eating plan may also help with weight loss. WHAT DO I NEED TO KNOW ABOUT THE DASH EATING PLAN? For the DASH eating plan, you will follow these general guidelines:  Choose foods with a percent daily value for sodium of less than 5% (as listed on the food label).  Use salt-free seasonings or herbs instead of table salt or sea salt.  Check with your health care provider or pharmacist before using salt substitutes.  Eat lower-sodium products, often labeled as "lower sodium" or "no salt added."  Eat fresh foods.  Eat more vegetables, fruits, and low-fat dairy products.  Choose whole grains. Look for the word "whole" as the first word in the ingredient list.  Choose fish and skinless chicken or [place] more often than red meat. Limit fish, poultry, and meat to 6 oz (170 g) each day.  Limit sweets, desserts, sugars, and sugary drinks.  Choose heart-healthy fats.  Limit cheese to 1 oz (28 g) per day.  Eat more home-cooked food and less restaurant, buffet, and fast food.  Limit fried foods.  Cook foods using methods other than frying.  Limit canned vegetables. If you do use them, rinse them well to decrease the sodium.  When  eating at a restaurant, ask that your food be prepared with less salt, or no salt if possible. WHAT FOODS CAN I EAT? Seek help from a dietitian for individual calorie needs. Grains Whole grain or whole wheat bread. Brown rice. Whole grain or whole wheat pasta. Quinoa, bulgur, and whole grain cereals. Low-sodium cereals. Corn or whole wheat flour tortillas. Whole grain cornbread. Whole grain crackers. Low-sodium crackers. Vegetables Fresh or frozen vegetables (raw, steamed, roasted, or grilled). Low-sodium or reduced-sodium tomato and vegetable juices. Low-sodium or reduced-sodium tomato sauce and paste. Low-sodium or reduced-sodium canned vegetables.  Fruits All fresh, canned (in natural juice), or frozen fruits. Meat and Other Protein Products Ground beef (85% or leaner), grass-fed beef, or beef trimmed of fat. Skinless chicken or [place]. Ground chicken or [place]. Pork trimmed of fat. All fish and seafood. Eggs. Dried beans, peas, or lentils. Unsalted nuts and seeds. Unsalted canned beans. Dairy Low-fat dairy products, such as skim or 1% milk, 2% or reduced-fat cheeses, low-fat ricotta or cottage cheese, or plain low-fat yogurt. Low-sodium or reduced-sodium cheeses. Fats and Oils Tub margarines without trans fats. Light or reduced-fat mayonnaise and salad dressings (reduced sodium). Avocado. Safflower, olive, or canola oils. Natural peanut or almond butter. Other Unsalted popcorn and pretzels. The items listed above may not be a complete list of recommended foods or beverages. Contact your dietitian for more options. WHAT FOODS ARE NOT RECOMMENDED? Grains White  bread. White pasta. White rice. Refined cornbread. Bagels and croissants. Crackers that contain trans fat. Vegetables Creamed or fried vegetables. Vegetables in a cheese sauce. Regular canned vegetables. Regular canned tomato sauce and paste. Regular tomato and vegetable juices. Fruits Dried fruits. Canned fruit in light or heavy  syrup. Fruit juice. Meat and Other Protein Products Fatty cuts of meat. Ribs, chicken wings, bacon, sausage, bologna, salami, chitterlings, fatback, hot dogs, bratwurst, and packaged luncheon meats. Salted nuts and seeds. Canned beans with salt. Dairy Whole or 2% milk, cream, half-and-half, and cream cheese. Whole-fat or sweetened yogurt. Full-fat cheeses or blue cheese. Nondairy creamers and whipped toppings. Processed cheese, cheese spreads, or cheese curds. Condiments Onion and garlic salt, seasoned salt, table salt, and sea salt. Canned and packaged gravies. Worcestershire sauce. Tartar sauce. Barbecue sauce. Teriyaki sauce. Soy sauce, including reduced sodium. Steak sauce. Fish sauce. Oyster sauce. Cocktail sauce. Horseradish. Ketchup and mustard. Meat flavorings and tenderizers. Bouillon cubes. Hot sauce. Tabasco sauce. Marinades. Taco seasonings. Relishes. Fats and Oils Butter, stick margarine, lard, shortening, ghee, and bacon fat. Coconut, palm kernel, or palm oils. Regular salad dressings. Other Pickles and olives. Salted popcorn and pretzels. The items listed above may not be a complete list of foods and beverages to avoid. Contact your dietitian for more information. WHERE CAN I FIND MORE INFORMATION? National Heart, Lung, and Blood Institute: travelstabloid.com Document Released: 04/15/2011 Document Revised: 09/10/2013 Document Reviewed: 02/28/2013 [facility] Patient Information 2015 [place], [place]. This information is not intended to replace advice given to you by your health care provider. Make sure you discuss any questions you have with your health care provider.

## 2014-10-19 LAB — VITAMIN D 25 HYDROXY (VIT D DEFICIENCY, FRACTURES): Vit D, 25-Hydroxy: 23 ng/mL — ABNORMAL LOW (ref 30–100)

## 2014-10-20 DIAGNOSIS — E559 Vitamin D deficiency, unspecified: Secondary | ICD-10-CM | POA: Insufficient documentation

## 2014-10-20 MED ORDER — VITAMIN D3 50 MCG (2000 UT) PO TABS: 2000.0000 [IU] | ORAL_TABLET | Freq: Every day | ORAL | Status: DC

## 2014-10-20 NOTE — Addendum Note (Signed)
Addended by: Boykin Nearing on: 10/20/2014 08:06 PM   Modules accepted: Orders

## 2014-10-20 NOTE — Assessment & Plan Note (Signed)
A: vit D insuff P: replace with 2 K U D3 daily

## 2014-10-29 ENCOUNTER — Telehealth: Payer: Self-pay | Admitting: *Deleted

## 2014-10-29 NOTE — Telephone Encounter (Signed)
-----   Message from Boykin Nearing, MD sent at 10/20/2014  8:04 PM EDT ----- Normal CBC Vit D def, replace with  2 K U D3 daily

## 2014-10-29 NOTE — Telephone Encounter (Signed)
LVM to return call.

## 2014-10-30 NOTE — Telephone Encounter (Signed)
Pt returning call, please f/u with pt. Please call back on cell number.

## 2014-10-30 NOTE — Telephone Encounter (Signed)
Pt aware of results 

## 2015-06-05 ENCOUNTER — Ambulatory Visit: Payer: No Typology Code available for payment source | Admitting: Family Medicine

## 2016-05-25 ENCOUNTER — Ambulatory Visit: Payer: No Typology Code available for payment source | Admitting: Family Medicine

## 2016-06-25 ENCOUNTER — Ambulatory Visit: Payer: Self-pay | Attending: Family Medicine | Admitting: Family Medicine

## 2016-06-25 ENCOUNTER — Encounter: Payer: Self-pay | Admitting: Family Medicine

## 2016-06-25 VITALS — BP 135/89 | HR 90 | Temp 98.1°F | Ht 64.0 in | Wt 190.8 lb

## 2016-06-25 DIAGNOSIS — I10 Essential (primary) hypertension: Secondary | ICD-10-CM

## 2016-06-25 DIAGNOSIS — Z79899 Other long term (current) drug therapy: Secondary | ICD-10-CM | POA: Insufficient documentation

## 2016-06-25 DIAGNOSIS — L709 Acne, unspecified: Secondary | ICD-10-CM | POA: Insufficient documentation

## 2016-06-25 DIAGNOSIS — Z0001 Encounter for general adult medical examination with abnormal findings: Secondary | ICD-10-CM | POA: Insufficient documentation

## 2016-06-25 DIAGNOSIS — L700 Acne vulgaris: Secondary | ICD-10-CM

## 2016-06-25 MED ORDER — CLINDAMYCIN PHOS-BENZOYL PEROX 1-5 % EX GEL: Freq: Two times a day (BID) | CUTANEOUS | 2 refills | Status: DC

## 2016-06-25 MED ORDER — HYDROCHLOROTHIAZIDE 12.5 MG PO TABS: 12.5000 mg | ORAL_TABLET | Freq: Every day | ORAL | 11 refills | Status: DC

## 2016-06-25 MED FILL — HYDROCHLOROTHIAZIDE 12.5 MG: 12.5 | 30 days supply | Qty: 30 | Fill #0

## 2016-06-25 MED FILL — CLINDAMYCIN-BENZOYL PEROX 1: 1-5 | 20 days supply | Qty: 50 | Fill #0

## 2016-06-25 NOTE — Patient Instructions (Addendum)
Teresa Hudson was seen today for hypertension.  Diagnoses and all orders for this visit:  Essential hypertension -     hydrochlorothiazide (HYDRODIURIL) 12.5 MG tablet; Take 1 tablet (12.5 mg total) by mouth daily.  Acne vulgaris -     clindamycin-benzoyl peroxide (BENZACLIN) gel; Apply topically 2 (two) times daily.   Benzaclin can cause skin drying and sensitivity Be sure to use a sunscreen moisturizer every morning I recommend Neutrogena Daily Defense Sunscreen Moisturizer  Restart HCTZ to help reduce BP Continue exercise, incorporate light weights Continue healthy eating that is low salt, low sugar like a Mediterranean or DASH diet   F/u in 4 weeks for HTN/acne   Dr. Adrian Blackwater    Heart-Healthy Eating Plan Introduction Heart-healthy meal planning includes:  Limiting unhealthy fats.  Increasing healthy fats.  Making other small dietary changes. You may need to talk with your doctor or a diet specialist (dietitian) to create an eating plan that is right for you. What types of fat should I choose?  Choose healthy fats. These include olive oil and canola oil, flaxseeds, walnuts, almonds, and seeds.  Eat more omega-3 fats. These include salmon, mackerel, sardines, tuna, flaxseed oil, and ground flaxseeds. Try to eat fish at least twice each week.  Limit saturated fats.  Saturated fats are often found in animal products, such as meats, butter, and cream.  Plant sources of saturated fats include palm oil, palm kernel oil, and coconut oil.  Avoid foods with partially hydrogenated oils in them. These include stick margarine, some tub margarines, cookies, crackers, and other baked goods. These contain trans fats. What general guidelines do I need to follow?  Check food labels carefully. Identify foods with trans fats or high amounts of saturated fat.  Fill one half of your plate with vegetables and green salads. Eat 4-5 servings of vegetables per day. A serving of vegetables  is:  1 cup of raw leafy vegetables.   cup of raw or cooked cut-up vegetables.   cup of vegetable juice.  Fill one fourth of your plate with whole grains. Look for the word "whole" as the first word in the ingredient list.  Fill one fourth of your plate with lean protein foods.  Eat 4-5 servings of fruit per day. A serving of fruit is:  One medium whole fruit.   cup of dried fruit.   cup of fresh, frozen, or canned fruit.   cup of 100% fruit juice.  Eat more foods that contain soluble fiber. These include apples, broccoli, carrots, beans, peas, and barley. Try to get 20-30 g of fiber per day.  Eat more home-cooked food. Eat less restaurant, buffet, and fast food.  Limit or avoid alcohol.  Limit foods high in starch and sugar.  Avoid fried foods.  Avoid frying your food. Try baking, boiling, grilling, or broiling it instead. You can also reduce fat by:  Removing the skin from poultry.  Removing all visible fats from meats.  Skimming the fat off of stews, soups, and gravies before serving them.  Steaming vegetables in water or broth.  Lose weight if you are overweight.  Eat 4-5 servings of nuts, legumes, and seeds per week:  One serving of dried beans or legumes equals  cup after being cooked.  One serving of nuts equals 1 ounces.  One serving of seeds equals  ounce or one tablespoon.  You may need to keep track of how much salt or sodium you eat. This is especially true if you have high  blood pressure. Talk with your doctor or dietitian to get more information. What foods can I eat? Grains  Breads, including [place], white, pita, wheat, raisin, rye, oatmeal, and [place]. Tortillas that are neither fried nor made with lard or trans fat. Low-fat rolls, including hotdog and hamburger buns and English muffins. Biscuits. Muffins. Waffles. Pancakes. Light popcorn. Whole-grain cereals. Flatbread. Melba toast. Pretzels. Breadsticks. Rusks. Low-fat snacks. Low-fat  crackers, including oyster, saltine, matzo, graham, animal, and rye. Rice and pasta, including brown rice and pastas that are made with whole wheat. Vegetables  All vegetables. Fruits  All fruits, but limit coconut. Meats and Other Protein Sources  Lean, well-trimmed beef, veal, pork, and lamb. Chicken and [place] without skin. All fish and shellfish. Wild duck, rabbit, pheasant, and venison. Egg whites or low-cholesterol egg substitutes. Dried beans, peas, lentils, and tofu. Seeds and most nuts. Dairy  Low-fat or nonfat cheeses, including ricotta, string, and mozzarella. Skim or 1% milk that is liquid, powdered, or evaporated. Buttermilk that is made with low-fat milk. Nonfat or low-fat yogurt. Beverages  Mineral water. Diet carbonated beverages. Sweets and Desserts  Sherbets and fruit ices. Honey, jam, marmalade, jelly, and syrups. Meringues and gelatins. Pure sugar candy, such as hard candy, jelly beans, gumdrops, mints, marshmallows, and small amounts of dark chocolate. [employer] Eat all sweets and desserts in moderation. Fats and Oils  Nonhydrogenated (trans-free) margarines. Vegetable oils, including soybean, sesame, sunflower, olive, peanut, safflower, corn, canola, and cottonseed. Salad dressings or mayonnaise made with a vegetable oil. Limit added fats and oils that you use for cooking, baking, salads, and as spreads. Other  Cocoa powder. Coffee and tea. All seasonings and condiments. The items listed above may not be a complete list of recommended foods or beverages. Contact your dietitian for more options.  What foods are not recommended? Grains  Breads that are made with saturated or trans fats, oils, or whole milk. Croissants. Butter rolls. Cheese breads. Sweet rolls. Donuts. Buttered popcorn. Chow mein noodles. High-fat crackers, such as cheese or butter crackers. Meats and Other Protein Sources  Fatty meats, such as hotdogs, short ribs, sausage, spareribs, bacon, rib eye  roast or steak, and mutton. High-fat deli meats, such as salami and bologna. Caviar. Domestic duck and goose. Organ meats, such as kidney, liver, sweetbreads, and heart. Dairy  Cream, sour cream, cream cheese, and creamed cottage cheese. Whole-milk cheeses, including blue (bleu), Monte[place] Jack, [place], [place], [place], [place], Swiss, cheddar, [place], and [place]. Whole or 2% milk that is liquid, evaporated, or condensed. Whole buttermilk. Cream sauce or high-fat cheese sauce. Yogurt that is made from whole milk. Beverages  Regular sodas and juice drinks with added sugar. Sweets and Desserts  Frosting. Pudding. Cookies. Cakes other than angel food cake. Candy that has milk chocolate or white chocolate, hydrogenated fat, butter, coconut, or unknown ingredients. Buttered syrups. Full-fat ice cream or ice cream drinks. Fats and Oils  Gravy that has suet, meat fat, or shortening. Cocoa butter, hydrogenated oils, palm oil, coconut oil, palm kernel oil. These can often be found in baked products, candy, fried foods, nondairy creamers, and whipped toppings. Solid fats and shortenings, including bacon fat, salt pork, lard, and butter. Nondairy cream substitutes, such as coffee creamers and sour cream substitutes. Salad dressings that are made of unknown oils, cheese, or sour cream. The items listed above may not be a complete list of foods and beverages to avoid. Contact your dietitian for more information.  This information is not intended to replace advice  given to you by your health care provider. Make sure you discuss any questions you have with your health care provider. Document Released: 10/26/2011 Document Revised: 10/02/2015 Document Reviewed: 10/18/2013  2017 Elsevier  DASH Eating Plan DASH stands for "Dietary Approaches to Stop Hypertension." The DASH eating plan is a healthy eating plan that has been shown to reduce high blood pressure (hypertension). Additional health benefits may include  reducing the risk of type 2 diabetes mellitus, heart disease, and stroke. The DASH eating plan may also help with weight loss. What do I need to know about the DASH eating plan? For the DASH eating plan, you will follow these general guidelines:  Choose foods with less than 150 milligrams of sodium per serving (as listed on the food label).  Use salt-free seasonings or herbs instead of table salt or sea salt.  Check with your health care provider or pharmacist before using salt substitutes.  Eat lower-sodium products. These are often labeled as "low-sodium" or "no salt added."  Eat fresh foods. Avoid eating a lot of canned foods.  Eat more vegetables, fruits, and low-fat dairy products.  Choose whole grains. Look for the word "whole" as the first word in the ingredient list.  Choose fish and skinless chicken or [place] more often than red meat. Limit fish, poultry, and meat to 6 oz (170 g) each day.  Limit sweets, desserts, sugars, and sugary drinks.  Choose heart-healthy fats.  Eat more home-cooked food and less restaurant, buffet, and fast food.  Limit fried foods.  Do not fry foods. Cook foods using methods such as baking, boiling, grilling, and broiling instead.  When eating at a restaurant, ask that your food be prepared with less salt, or no salt if possible. What foods can I eat? Seek help from a dietitian for individual calorie needs. Grains  Whole grain or whole wheat bread. Brown rice. Whole grain or whole wheat pasta. Quinoa, bulgur, and whole grain cereals. Low-sodium cereals. Corn or whole wheat flour tortillas. Whole grain cornbread. Whole grain crackers. Low-sodium crackers. Vegetables  Fresh or frozen vegetables (raw, steamed, roasted, or grilled). Low-sodium or reduced-sodium tomato and vegetable juices. Low-sodium or reduced-sodium tomato sauce and paste. Low-sodium or reduced-sodium canned vegetables. Fruits  All fresh, canned (in natural juice), or frozen  fruits. Meat and Other Protein Products  Ground beef (85% or leaner), grass-fed beef, or beef trimmed of fat. Skinless chicken or [place]. Ground chicken or [place]. Pork trimmed of fat. All fish and seafood. Eggs. Dried beans, peas, or lentils. Unsalted nuts and seeds. Unsalted canned beans. Dairy  Low-fat dairy products, such as skim or 1% milk, 2% or reduced-fat cheeses, low-fat ricotta or cottage cheese, or plain low-fat yogurt. Low-sodium or reduced-sodium cheeses. Fats and Oils  Tub margarines without trans fats. Light or reduced-fat mayonnaise and salad dressings (reduced sodium). Avocado. Safflower, olive, or canola oils. Natural peanut or almond butter. Other  Unsalted popcorn and pretzels. The items listed above may not be a complete list of recommended foods or beverages. Contact your dietitian for more options.  What foods are not recommended? Grains  White bread. White pasta. White rice. Refined cornbread. Bagels and croissants. Crackers that contain trans fat. Vegetables  Creamed or fried vegetables. Vegetables in a cheese sauce. Regular canned vegetables. Regular canned tomato sauce and paste. Regular tomato and vegetable juices. Fruits  Canned fruit in light or heavy syrup. Fruit juice. Meat and Other Protein Products  Fatty cuts of meat. Ribs, chicken wings, bacon, sausage, bologna,  salami, chitterlings, fatback, hot dogs, bratwurst, and packaged luncheon meats. Salted nuts and seeds. Canned beans with salt. Dairy  Whole or 2% milk, cream, half-and-half, and cream cheese. Whole-fat or sweetened yogurt. Full-fat cheeses or blue cheese. Nondairy creamers and whipped toppings. Processed cheese, cheese spreads, or cheese curds. Condiments  Onion and garlic salt, seasoned salt, table salt, and sea salt. Canned and packaged gravies. Worcestershire sauce. Tartar sauce. Barbecue sauce. Teriyaki sauce. Soy sauce, including reduced sodium. Steak sauce. Fish sauce. Oyster sauce. Cocktail  sauce. Horseradish. Ketchup and mustard. Meat flavorings and tenderizers. Bouillon cubes. Hot sauce. Tabasco sauce. Marinades. Taco seasonings. Relishes. Fats and Oils  Butter, stick margarine, lard, shortening, ghee, and bacon fat. Coconut, palm kernel, or palm oils. Regular salad dressings. Other  Pickles and olives. Salted popcorn and pretzels. The items listed above may not be a complete list of foods and beverages to avoid. Contact your dietitian for more information.  Where can I find more information? National Heart, Lung, and Blood Institute: travelstabloid.com This information is not intended to replace advice given to you by your health care provider. Make sure you discuss any questions you have with your health care provider. Document Released: 04/15/2011 Document Revised: 10/02/2015 Document Reviewed: 02/28/2013 Elsevier Interactive Patient Education  2017 [employer].

## 2016-06-25 NOTE — Assessment & Plan Note (Signed)
benzaclin Cautioned patient regarding skin irritation and sensitivity Sunscreen moisturizer

## 2016-06-25 NOTE — Progress Notes (Signed)
Subjective:  Patient ID: Teresa Hudson, female    DOB: 1975/06/11  Age: 41 y.o. MRN: [id number]  CC: Hypertension   HPI Christyanna T Ellington presents for    1. CHRONIC HYPERTENSION; not taking HCTZ. BP was normal off medicine last visit. She has been checking. BP was high on one check. She is exercising for weight loss and eating better. She is a chronic non smoker.   2. Acne: she has tried clearasil, honey, benzyl peroxide. She is currently using olive oil soap and coconut oil for moisturizer.   Social History  Substance Use Topics  . Smoking status: Never Smoker  . Smokeless tobacco: Never Used  . Alcohol use Yes     Comment: socially    Outpatient Medications Prior to Visit  Medication Sig Dispense Refill  . cetirizine (ZYRTEC) 10 MG tablet Take 1 tablet (10 mg total) by mouth daily. 30 tablet 11  . Cholecalciferol (VITAMIN D3) 2000 UNITS TABS Take 2,000 Units by mouth daily. 30 tablet 11  . Multiple Vitamins-Minerals (MULTIVITAMIN WITH MINERALS) tablet Take 1 tablet by mouth daily.    . potassium chloride SA (K-DUR,KLOR-CON) 20 MEQ tablet Take 1 tablet (20 mEq total) by mouth 2 (two) times daily. 14 tablet 0  . hydrochlorothiazide (HYDRODIURIL) 25 MG tablet Take 0.5 tablets (12.5 mg total) by mouth daily. (Patient not taking: Reported on 06/25/2016) 30 tablet 3   No facility-administered medications prior to visit.     ROS Review of Systems  Constitutional: Negative for chills and fever.  Eyes: Negative for visual disturbance.  Respiratory: Negative for shortness of breath.   Cardiovascular: Negative for chest pain.  Gastrointestinal: Negative for abdominal pain and blood in stool.  Musculoskeletal: Negative for arthralgias and back pain.  Skin: Positive for rash.  Allergic/Immunologic: Negative for immunocompromised state.  Hematological: Negative for adenopathy. Does not bruise/bleed easily.  Psychiatric/Behavioral: Negative for dysphoric mood and suicidal ideas.     Objective:  BP 135/89 (BP Location: Left Arm, Patient Position: Sitting, Cuff Size: Small)   Pulse 90   Temp 98.1 F (36.7 C) (Oral)   Ht 5\' 4"  (1.626 m)   Wt 190 lb 12.8 oz (86.5 kg)   SpO2 99%   BMI 32.75 kg/m   BP/Weight 06/25/2016 10/18/2014 [id number]  Systolic BP [id number]  Diastolic BP 89 94 80  Wt. (Lbs) 190.8 180.6 178  BMI 32.75 30.98 30.54   BP Readings from Last 3 Encounters:  06/25/16 135/89  10/18/14 (!) 131/94  01/29/14 130/80     Physical Exam  Constitutional: She is oriented to person, place, and time. She appears well-developed and well-nourished. No distress.  HENT:  Head: Normocephalic and atraumatic.  Cardiovascular: Normal rate, regular rhythm, normal heart sounds and intact distal pulses.   Pulmonary/Chest: Effort normal and breath sounds normal.  Musculoskeletal: She exhibits no edema.  Neurological: She is alert and oriented to person, place, and time.  Skin: Skin is warm and dry. No rash noted.     Psychiatric: She has a normal mood and affect.   Assessment & Plan:  Nahlia was seen today for hypertension.  Diagnoses and all orders for this visit:  Essential hypertension -     hydrochlorothiazide (HYDRODIURIL) 12.5 MG tablet; Take 1 tablet (12.5 mg total) by mouth daily.  Acne vulgaris -     clindamycin-benzoyl peroxide (BENZACLIN) gel; Apply topically 2 (two) times daily.   There are no diagnoses linked to this encounter.  No orders of the defined  types were placed in this encounter.   Follow-up: Return in about 4 weeks (around 07/23/2016) for HTN and acne .   Boykin Nearing MD

## 2016-06-25 NOTE — Assessment & Plan Note (Signed)
BP in pre-hypertension range Plan: Restart HCTZ 12.5 mg daily

## 2016-07-23 ENCOUNTER — Ambulatory Visit: Payer: Self-pay | Admitting: Family Medicine

## 2016-08-13 ENCOUNTER — Encounter (HOSPITAL_COMMUNITY): Payer: Self-pay | Admitting: *Deleted

## 2016-09-22 ENCOUNTER — Encounter: Payer: Self-pay | Admitting: Family Medicine

## 2016-12-31 ENCOUNTER — Ambulatory Visit: Payer: Self-pay | Admitting: Family Medicine

## 2017-11-30 ENCOUNTER — Ambulatory Visit: Payer: Self-pay | Attending: Nurse Practitioner | Admitting: Nurse Practitioner

## 2017-11-30 ENCOUNTER — Encounter: Payer: Self-pay | Admitting: Nurse Practitioner

## 2017-11-30 VITALS — BP 141/91 | HR 109 | Temp 99.0°F | Ht 64.0 in | Wt 189.2 lb

## 2017-11-30 DIAGNOSIS — M542 Cervicalgia: Secondary | ICD-10-CM | POA: Insufficient documentation

## 2017-11-30 DIAGNOSIS — Z79899 Other long term (current) drug therapy: Secondary | ICD-10-CM | POA: Insufficient documentation

## 2017-11-30 DIAGNOSIS — I10 Essential (primary) hypertension: Secondary | ICD-10-CM | POA: Insufficient documentation

## 2017-11-30 MED ORDER — TIZANIDINE HCL 4 MG PO CAPS: 4.0000 mg | ORAL_CAPSULE | Freq: Three times a day (TID) | ORAL | 0 refills | Status: DC

## 2017-11-30 NOTE — Patient Instructions (Signed)
Cooking With Less Salt Cooking with less salt is one way to reduce the amount of sodium you get from food. Depending on your condition and overall health, your health care provider or diet and nutrition specialist (dietitian) may recommend that you reduce your sodium intake. Most people should have less than 2,300 milligrams (mg) of sodium each day. If you have high blood pressure (hypertension), you may need to limit your sodium to 1,500 mg each day. Follow the tips below to help reduce your sodium intake. What do I need to know about cooking with less salt? Shopping  Buy sodium-free or low-sodium products. Look for the following words on food labels: ? Low-sodium. ? Sodium-free. ? Reduced-sodium. ? No salt added. ? Unsalted.  Buy fresh or frozen vegetables. Avoid canned vegetables.  Avoid buying meats or protein foods that have been injected with broth or saline solution.  Avoid cured or smoked meats, such as hot dogs, bacon, salami, ham, and bologna. Reading food labels  Check the food label before buying or using packaged ingredients.  Look for products with no more than 140 mg of sodium in one serving.  Do not choose foods with salt as one of the first three ingredients on the ingredients list. If salt is one of the first three ingredients, it usually means the item is high in sodium, because ingredients are listed in order of amount in the food item. Cooking  Use herbs, seasonings without salt, and spices as substitutes for salt in foods.  Use sodium-free baking soda when baking.  Grill, braise, or roast foods to add flavor with less salt.  Avoid adding salt to pasta, rice, or hot cereals while cooking.  Drain and rinse canned vegetables before use.  Avoid adding salt when cooking sweets and desserts.  Cook with low-sodium ingredients. What are some salt alternatives? The following are herbs, seasonings, and spices that can be used instead of salt to give taste to your  food. Herbs should be fresh or dried. Do not choose packaged mixes. Next to the name of the herb, spice, or seasoning are some examples of foods you can pair it with. Herbs  Bay leaves - Soups, meat and vegetable dishes, and spaghetti sauce.  Basil - [employer], soups, pasta, and fish dishes.  Cilantro - Meat, poultry, and vegetable dishes.  Chili powder - Marinades and Mexican dishes.  Chives - Salad dressings and potato dishes.  Cumin - Mexican dishes, couscous, and meat dishes.  Dill - Fish dishes, sauces, and salads.  Fennel - Meat and vegetable dishes, breads, and cookies.  Garlic (do not use garlic salt) - [place] dishes, meat dishes, salad dressings, and sauces.  Marjoram - Soups, potato dishes, and meat dishes.  Oregano - Pizza and spaghetti sauce.  Parsley - Salads, soups, pasta, and meat dishes.  Rosemary - [place] dishes, salad dressings, soups, and red meats.  Saffron - Fish dishes, pasta, and some poultry dishes.  Sage - Stuffings and sauces.  Tarragon - Fish and [employer].  Thyme - Stuffing, meat, and fish dishes. Seasonings  Lemon juice - Fish dishes, poultry dishes, vegetables, and salads.  Vinegar - Salad dressings, vegetables, and fish dishes. Spices  Cinnamon - Sweet dishes, such as cakes, cookies, and puddings.  Cloves - Gingerbread, puddings, and marinades for meats.  Curry - Vegetable dishes, fish and poultry dishes, and stir-fry dishes.  Ginger - Vegetables dishes, fish dishes, and stir-fry dishes.  Nutmeg - Pasta, vegetables, poultry, fish dishes, and custard. What  are some low-sodium ingredients and foods?  Fresh or frozen fruits and vegetables with no sauce added.  Fresh or frozen whole meats, poultry, and fish with no sauce added.  Eggs.  Noodles, pasta, quinoa, rice.  Shredded or puffed wheat or puffed rice.  Regular or quick oats.  Milk, yogurt, hard cheeses, and low-sodium cheeses. Good cheese choices include  [place], [place]. Always check the label for the serving size and sodium content.  Unsalted butter or margarine.  Unsalted nuts.  Sherbet or ice cream (keep to  cup per serving).  Homemade pudding.  Sodium-free baking soda and baking powder. This is not a complete list of low-sodium ingredients and foods. Contact your dietitian for more options. Summary  Cooking with less salt is one way to reduce the amount of sodium that you get from food.  Buy sodium-free or low-sodium products.  Check the food label before using or buying packaged ingredients.  Use herbs, seasonings without salt, and spices as substitutes for salt in foods. This information is not intended to replace advice given to you by your health care provider. Make sure you discuss any questions you have with your health care provider. Document Released: 04/26/2005 Document Revised: 05/04/2016 Document Reviewed: 05/04/2016 Elsevier Interactive Patient Education  2017 [place] DASH stands for "Dietary Approaches to Stop Hypertension." The DASH eating plan is a healthy eating plan that has been shown to reduce high blood pressure (hypertension). It may also reduce your risk for type 2 diabetes, heart disease, and stroke. The DASH eating plan may also help with weight loss. What are tips for following this plan? General guidelines  Avoid eating more than 2,300 mg (milligrams) of salt (sodium) a day. If you have hypertension, you may need to reduce your sodium intake to 1,500 mg a day.  Limit alcohol intake to no more than 1 drink a day for nonpregnant women and 2 drinks a day for men. One drink equals 12 oz of beer, 5 oz of wine, or 1 oz of hard liquor.  Work with your health care provider to maintain a healthy body weight or to lose weight. Ask what an ideal weight is for you.  Get at least 30 minutes of exercise that causes your heart to beat faster (aerobic exercise) most  days of the week. Activities may include walking, swimming, or biking.  Work with your health care provider or diet and nutrition specialist (dietitian) to adjust your eating plan to your individual calorie needs. Reading food labels  Check food labels for the amount of sodium per serving. Choose foods with less than 5 percent of the Daily Value of sodium. Generally, foods with less than 300 mg of sodium per serving fit into this eating plan.  To find whole grains, look for the word "whole" as the first word in the ingredient list. Shopping  Buy products labeled as "low-sodium" or "no salt added."  Buy fresh foods. Avoid canned foods and premade or frozen meals. Cooking  Avoid adding salt when cooking. Use salt-free seasonings or herbs instead of table salt or sea salt. Check with your health care provider or pharmacist before using salt substitutes.  Do not fry foods. Cook foods using healthy methods such as baking, boiling, grilling, and broiling instead.  Cook with heart-healthy oils, such as olive, canola, soybean, or sunflower oil. Meal planning   Eat a balanced diet that includes: ? 5 or more servings of fruits and vegetables each day.  At each meal, try to fill half of your plate with fruits and vegetables. ? Up to 6-8 servings of whole grains each day. ? Less than 6 oz of lean meat, poultry, or fish each day. A 3-oz serving of meat is about the same size as a deck of cards. One egg equals 1 oz. ? 2 servings of low-fat dairy each day. ? A serving of nuts, seeds, or beans 5 times each week. ? Heart-healthy fats. Healthy fats called Omega-3 fatty acids are found in foods such as flaxseeds and coldwater fish, like sardines, salmon, and mackerel.  Limit how much you eat of the following: ? Canned or prepackaged foods. ? Food that is high in trans fat, such as fried foods. ? Food that is high in saturated fat, such as fatty meat. ? Sweets, desserts, sugary drinks, and other foods  with added sugar. ? Full-fat dairy products.  Do not salt foods before eating.  Try to eat at least 2 vegetarian meals each week.  Eat more home-cooked food and less restaurant, buffet, and fast food.  When eating at a restaurant, ask that your food be prepared with less salt or no salt, if possible. What foods are recommended? The items listed may not be a complete list. Talk with your dietitian about what dietary choices are best for you. Grains Whole-grain or whole-wheat bread. Whole-grain or whole-wheat pasta. Brown rice. [place]. Bulgur. Whole-grain and low-sodium cereals. Pita bread. Low-fat, low-sodium crackers. Whole-wheat flour tortillas. Vegetables Fresh or frozen vegetables (raw, steamed, roasted, or grilled). Low-sodium or reduced-sodium tomato and vegetable juice. Low-sodium or reduced-sodium tomato sauce and tomato paste. Low-sodium or reduced-sodium canned vegetables. Fruits All fresh, dried, or frozen fruit. Canned fruit in natural juice (without added sugar). Meat and other protein foods Skinless chicken or [place]. Ground chicken or [place]. Pork with fat trimmed off. Fish and seafood. Egg whites. Dried beans, peas, or lentils. Unsalted nuts, nut butters, and seeds. Unsalted canned beans. Lean cuts of beef with fat trimmed off. Low-sodium, lean deli meat. Dairy Low-fat (1%) or fat-free (skim) milk. Fat-free, low-fat, or reduced-fat cheeses. Nonfat, low-sodium ricotta or cottage cheese. Low-fat or nonfat yogurt. Low-fat, low-sodium cheese. Fats and oils Soft margarine without trans fats. Vegetable oil. Low-fat, reduced-fat, or light mayonnaise and salad dressings (reduced-sodium). Canola, safflower, olive, soybean, and sunflower oils. Avocado. Seasoning and other foods Herbs. Spices. Seasoning mixes without salt. Unsalted popcorn and pretzels. Fat-free sweets. What foods are not recommended? The items listed may not be a complete list. Talk with your dietitian about  what dietary choices are best for you. Grains Baked goods made with fat, such as croissants, muffins, or some breads. Dry pasta or rice meal packs. Vegetables Creamed or fried vegetables. Vegetables in a cheese sauce. Regular canned vegetables (not low-sodium or reduced-sodium). Regular canned tomato sauce and paste (not low-sodium or reduced-sodium). Regular tomato and vegetable juice (not low-sodium or reduced-sodium). Angie Fava. Olives. Fruits Canned fruit in a light or heavy syrup. Fried fruit. Fruit in cream or butter sauce. Meat and other protein foods Fatty cuts of meat. Ribs. Fried meat. Berniece Salines. Sausage. Bologna and other processed lunch meats. Salami. Fatback. Hotdogs. Bratwurst. Salted nuts and seeds. Canned beans with added salt. Canned or smoked fish. Whole eggs or egg yolks. Chicken or [place] with skin. Dairy Whole or 2% milk, cream, and half-and-half. Whole or full-fat cream cheese. Whole-fat or sweetened yogurt. Full-fat cheese. Nondairy creamers. Whipped toppings. Processed cheese and cheese spreads. Fats and oils Butter. Stick margarine.  Lard. Shortening. Ghee. Bacon fat. Tropical oils, such as coconut, palm kernel, or palm oil. Seasoning and other foods Salted popcorn and pretzels. Onion salt, garlic salt, seasoned salt, table salt, and sea salt. Worcestershire sauce. Tartar sauce. Barbecue sauce. Teriyaki sauce. Soy sauce, including reduced-sodium. Steak sauce. Canned and packaged gravies. Fish sauce. Oyster sauce. Cocktail sauce. Horseradish that you find on the shelf. Ketchup. Mustard. Meat flavorings and tenderizers. Bouillon cubes. Hot sauce and Tabasco sauce. Premade or packaged marinades. Premade or packaged taco seasonings. Relishes. Regular salad dressings. Where to find more information:  National Heart, Lung, and [place]: https://wilson-eaton.com/  American Heart Association: www.heart.org Summary  The DASH eating plan is a healthy eating plan that has been shown to  reduce high blood pressure (hypertension). It may also reduce your risk for type 2 diabetes, heart disease, and stroke.  With the DASH eating plan, you should limit salt (sodium) intake to 2,300 mg a day. If you have hypertension, you may need to reduce your sodium intake to 1,500 mg a day.  When on the DASH eating plan, aim to eat more fresh fruits and vegetables, whole grains, lean proteins, low-fat dairy, and heart-healthy fats.  Work with your health care provider or diet and nutrition specialist (dietitian) to adjust your eating plan to your individual calorie needs. This information is not intended to replace advice given to you by your health care provider. Make sure you discuss any questions you have with your health care provider. Document Released: 04/15/2011 Document Revised: 04/19/2016 Document Reviewed: 04/19/2016 Elsevier Interactive Patient Education  2018 [employer].  Managing Your Hypertension Hypertension is commonly called high blood pressure. This is when the force of your blood pressing against the walls of your arteries is too strong. Arteries are blood vessels that carry blood from your heart throughout your body. Hypertension forces the heart to work harder to pump blood, and may cause the arteries to become narrow or stiff. Having untreated or uncontrolled hypertension can cause heart attack, stroke, kidney disease, and other problems. What are blood pressure readings? A blood pressure reading consists of a higher number over a lower number. Ideally, your blood pressure should be below 120/80. The first ("top") number is called the systolic pressure. It is a measure of the pressure in your arteries as your heart beats. The second ("bottom") number is called the diastolic pressure. It is a measure of the pressure in your arteries as the heart relaxes. What does my blood pressure reading mean? Blood pressure is classified into four stages. Based on your blood pressure  reading, your health care provider may use the following stages to determine what type of treatment you need, if any. Systolic pressure and diastolic pressure are measured in a unit called mm Hg. Normal  Systolic pressure: below 811.  Diastolic pressure: below 80. Elevated  Systolic pressure: 914-782.  Diastolic pressure: below 80. Hypertension stage 1  Systolic pressure: 956-213.  Diastolic pressure: 08-65. Hypertension stage 2  Systolic pressure: 784 or above.  Diastolic pressure: 90 or above. What health risks are associated with hypertension? Managing your hypertension is an important responsibility. Uncontrolled hypertension can lead to:  A heart attack.  A stroke.  A weakened blood vessel (aneurysm).  Heart failure.  Kidney damage.  Eye damage.  Metabolic syndrome.  Memory and concentration problems.  What changes can I make to manage my hypertension? Hypertension can be managed by making lifestyle changes and possibly by taking medicines. Your health care provider will help you  make a plan to bring your blood pressure within a normal range. Eating and drinking  Eat a diet that is high in fiber and potassium, and low in salt (sodium), added sugar, and fat. An example eating plan is called the DASH (Dietary Approaches to Stop Hypertension) diet. To eat this way: ? Eat plenty of fresh fruits and vegetables. Try to fill half of your plate at each meal with fruits and vegetables. ? Eat whole grains, such as whole wheat pasta, brown rice, or whole grain bread. Fill about one quarter of your plate with whole grains. ? Eat low-fat diary products. ? Avoid fatty cuts of meat, processed or cured meats, and poultry with skin. Fill about one quarter of your plate with lean proteins such as fish, chicken without skin, beans, eggs, and tofu. ? Avoid premade and processed foods. These tend to be higher in sodium, added sugar, and fat.  Reduce your daily sodium intake. Most  people with hypertension should eat less than 1,500 mg of sodium a day.  Limit alcohol intake to no more than 1 drink a day for nonpregnant women and 2 drinks a day for men. One drink equals 12 oz of beer, 5 oz of wine, or 1 oz of hard liquor. Lifestyle  Work with your health care provider to maintain a healthy body weight, or to lose weight. Ask what an ideal weight is for you.  Get at least 30 minutes of exercise that causes your heart to beat faster (aerobic exercise) most days of the week. Activities may include walking, swimming, or biking.  Include exercise to strengthen your muscles (resistance exercise), such as weight lifting, as part of your weekly exercise routine. Try to do these types of exercises for 30 minutes at least 3 days a week.  Do not use any products that contain nicotine or tobacco, such as cigarettes and e-cigarettes. If you need help quitting, ask your health care provider.  Control any long-term (chronic) conditions you have, such as high cholesterol or diabetes. Monitoring  Monitor your blood pressure at home as told by your health care provider. Your personal target blood pressure may vary depending on your medical conditions, your age, and other factors.  Have your blood pressure checked regularly, as often as told by your health care provider. Working with your health care provider  Review all the medicines you take with your health care provider because there may be side effects or interactions.  Talk with your health care provider about your diet, exercise habits, and other lifestyle factors that may be contributing to hypertension.  Visit your health care provider regularly. Your health care provider can help you create and adjust your plan for managing hypertension. Will I need medicine to control my blood pressure? Your health care provider may prescribe medicine if lifestyle changes are not enough to get your blood pressure under control, and  if:  Your systolic blood pressure is 130 or higher.  Your diastolic blood pressure is 80 or higher.  Take medicines only as told by your health care provider. Follow the directions carefully. Blood pressure medicines must be taken as prescribed. The medicine does not work as well when you skip doses. Skipping doses also puts you at risk for problems. Contact a health care provider if:  You think you are having a reaction to medicines you have taken.  You have repeated (recurrent) headaches.  You feel dizzy.  You have swelling in your ankles.  You have trouble with your  vision. Get help right away if:  You develop a severe headache or confusion.  You have unusual weakness or numbness, or you feel faint.  You have severe pain in your chest or abdomen.  You vomit repeatedly.  You have trouble breathing. Summary  Hypertension is when the force of blood pumping through your arteries is too strong. If this condition is not controlled, it may put you at risk for serious complications.  Your personal target blood pressure may vary depending on your medical conditions, your age, and other factors. For most people, a normal blood pressure is less than 120/80.  Hypertension is managed by lifestyle changes, medicines, or both. Lifestyle changes include weight loss, eating a healthy, low-sodium diet, exercising more, and limiting alcohol. This information is not intended to replace advice given to you by your health care provider. Make sure you discuss any questions you have with your health care provider. Document Released: 01/19/2012 Document Revised: 03/24/2016 Document Reviewed: 03/24/2016 Elsevier Interactive Patient Education  [employer].

## 2017-11-30 NOTE — Progress Notes (Signed)
Assessment & Plan:  Teresa Hudson was seen today for establish care.  Diagnoses and all orders for this visit:  Essential hypertension -     CBC -     CMP14+EGFR -     Lipid panel Remember to bring in your blood pressure log with you for your follow up appointment.  DASH/Mediterranean Diets are healthier choices for HTN.  Exercise at least 30 minutes per day/5 days per week  Neck pain on right side -     tiZANidine (ZANAFLEX) 4 MG capsule; Take 1 capsule (4 mg total) by mouth 3 (three) times daily. May alternate with heat and ice application for pain relief. May also alternate with acetaminophen and Ibuprofen as prescribed for pain.    Patient has been counseled on age-appropriate routine health concerns for screening and prevention. These are reviewed and up-to-date. Referrals have been placed accordingly. Immunizations are up-to-date or declined.    Subjective:   Chief Complaint  Patient presents with  . Establish Care    Patient is here for hypertension. Pt. have neck pain for a week.    HPI Teresa Hudson 42 y.o. female presents to office today to establish care. She has a history of HTN and currently with complaints of right sided neck pain which is improving since last week.    Essential Hypertension She has stopped taking her HCTZ 12.5 mg daily. She would like to try dietary and exercise modifications at this time and recheck BP at next office visit. Denies chest pain, shortness of breath, palpitations, lightheadedness, dizziness, headaches or BLE edema.  BP Readings from Last 3 Encounters:  11/30/17 (!) 141/91  06/25/16 135/89  10/18/14 (!) 131/94    Neck Pain Patient complains of neck pain. Event that precipitate these symptoms: "I think I slept wrong". Onset of symptoms a few days ago, gradually improving since that time. Current symptoms are pain in right cervical area radiating down through right trapezius (aching, dull and tight band in character; 4/10 in severity)  and stiffness in the above mentioned area. Patient denies paresthesias  or weakness in. Patient has had no prior neck problems.  Previous treatments include: none.   Review of Systems  Constitutional: Negative for fever, malaise/fatigue and weight loss.  HENT: Negative.  Negative for nosebleeds.   Eyes: Negative.  Negative for blurred vision, double vision and photophobia.  Respiratory: Negative.  Negative for cough and shortness of breath.   Cardiovascular: Negative.  Negative for chest pain, palpitations and leg swelling.  Gastrointestinal: Negative.  Negative for heartburn, nausea and vomiting.  Musculoskeletal: Positive for myalgias and neck pain. Negative for back pain, falls and joint pain.  Neurological: Negative.  Negative for dizziness, sensory change, focal weakness, seizures and headaches.  Psychiatric/Behavioral: Negative.  Negative for suicidal ideas.    Past Medical History:  Diagnosis Date  . Anemia 1997  . Chlamydia   . Fibroids   . Fibroids   . Hypertension 2012 dx  . LSIL (low grade squamous intraepithelial lesion) on Pap smear 01/24/2012   Colpo 01/24/12    Past Surgical History:  Procedure Laterality Date  . DILATION AND CURETTAGE OF UTERUS  1999  . wisdom tooth removal      Family History  Problem Relation Age of Onset  . Cancer Mother        breast  . Diabetes Mother   . Cancer Father        prostate  . Hypertension Father   . Asthma Sister   .  GI problems Sister   . Thyroid cancer Sister     Social History Reviewed with no changes to be made today.   Outpatient Medications Prior to Visit  Medication Sig Dispense Refill  . hydrochlorothiazide (HYDRODIURIL) 12.5 MG tablet Take 1 tablet (12.5 mg total) by mouth daily. (Patient not taking: Reported on 11/30/2017) 30 tablet 11  . potassium chloride SA (K-DUR,KLOR-CON) 20 MEQ tablet Take 1 tablet (20 mEq total) by mouth 2 (two) times daily. (Patient not taking: Reported on 11/30/2017) 14 tablet 0  .  cetirizine (ZYRTEC) 10 MG tablet Take 1 tablet (10 mg total) by mouth daily. (Patient not taking: Reported on 11/30/2017) 30 tablet 11  . Cholecalciferol (VITAMIN D3) 2000 UNITS TABS Take 2,000 Units by mouth daily. (Patient not taking: Reported on 11/30/2017) 30 tablet 11  . clindamycin-benzoyl peroxide (BENZACLIN) gel Apply topically 2 (two) times daily. (Patient not taking: Reported on 11/30/2017) 50 g 2  . Multiple Vitamins-Minerals (MULTIVITAMIN WITH MINERALS) tablet Take 1 tablet by mouth daily.     No facility-administered medications prior to visit.     Allergies  Allergen Reactions  . Sulfa Antibiotics Anaphylaxis  . Aspirin Swelling  . Latex Itching and Swelling  . Penicillins Swelling    Facial swelling  . Nickel Rash       Objective:    BP (!) 141/91 (BP Location: Left Arm, Patient Position: Sitting, Cuff Size: Large)   Pulse (!) 109   Temp 99 F (37.2 C) (Oral)   Ht _0  (1.626 m)   Wt 189 lb 3.2 oz (85.8 kg)   LMP 11/07/2017   SpO2 100%   BMI 32.48 kg/m  Wt Readings from Last 3 Encounters:  11/30/17 189 lb 3.2 oz (85.8 kg)  06/25/16 190 lb 12.8 oz (86.5 kg)  10/18/14 180 lb 9.6 oz (81.9 kg)    Physical Exam  Constitutional: She is oriented to person, place, and time. She appears well-developed and well-nourished. She is cooperative.  HENT:  Head: Normocephalic and atraumatic.  Eyes: EOM are normal.  Neck: Normal range of motion.  Cardiovascular: Regular rhythm and normal heart sounds. Tachycardia present. Exam reveals no gallop and no friction rub.  No murmur heard. Pulmonary/Chest: Effort normal and breath sounds normal. No tachypnea. No respiratory distress. She has no decreased breath sounds. She has no wheezes. She has no rhonchi. She has no rales. She exhibits no tenderness.  Abdominal: Bowel sounds are normal.  Musculoskeletal: Normal range of motion. She exhibits no edema.  Neurological: She is alert and oriented to person, place, and time.  Coordination normal.  Skin: Skin is warm and dry.  Psychiatric: She has a normal mood and affect. Her behavior is normal. Judgment and thought content normal.  Nursing note and vitals reviewed.      Patient has been counseled extensively about nutrition and exercise as well as the importance of adherence with medications and regular follow-up. The patient was given clear instructions to go to ER or return to medical center if symptoms don't improve, worsen or new problems develop. The patient verbalized understanding.   Follow-up: Return for PAP .   Gildardo Pounds, FNP-BC [facility], [place]   11/30/2017, 12:08 PM

## 2017-11-30 NOTE — Progress Notes (Signed)
.  hem

## 2017-12-01 LAB — CBC
Hematocrit: 35.1 % (ref 34.0–46.6)
Hemoglobin: 10.2 g/dL — ABNORMAL LOW (ref 11.1–15.9)
MCH: 20 pg — ABNORMAL LOW (ref 26.6–33.0)
MCHC: 29.1 g/dL — ABNORMAL LOW (ref 31.5–35.7)
MCV: 69 fL — AB (ref 79–97)
PLATELETS: 404 10*3/uL (ref 150–450)
RBC: 5.09 x10E6/uL (ref 3.77–5.28)
RDW: 17.8 % — ABNORMAL HIGH (ref 12.3–15.4)
WBC: 5.5 10*3/uL (ref 3.4–10.8)

## 2017-12-01 LAB — CMP14+EGFR
A/G RATIO: 1.5 (ref 1.2–2.2)
ALT: 9 IU/L (ref 0–32)
AST: 14 IU/L (ref 0–40)
Albumin: 4.4 g/dL (ref 3.5–5.5)
Alkaline Phosphatase: 49 IU/L (ref 39–117)
BUN/Creatinine Ratio: 20 (ref 9–23)
BUN: 16 mg/dL (ref 6–20)
Bilirubin Total: <0.2 mg/dL (ref 0.0–1.2)
CALCIUM: 9.2 mg/dL (ref 8.7–10.2)
CO2: 20 mmol/L (ref 20–29)
Chloride: 103 mmol/L (ref 96–106)
Creatinine, Ser: 0.79 mg/dL (ref 0.57–1.00)
GFR calc Af Amer: 109 mL/min/{1.73_m2} (ref >59)
GFR, EST NON AFRICAN AMERICAN: 95 mL/min/{1.73_m2} (ref >59)
GLUCOSE: 92 mg/dL (ref 65–99)
Globulin, Total: 3 g/dL (ref 1.5–4.5)
Potassium: 4.1 mmol/L (ref 3.5–5.2)
Sodium: 140 mmol/L (ref 134–144)
Total Protein: 7.4 g/dL (ref 6.0–8.5)

## 2017-12-01 LAB — LIPID PANEL
Chol/HDL Ratio: 2.6 ratio (ref 0.0–4.4)
Cholesterol, Total: 167 mg/dL (ref 100–199)
HDL: 64 mg/dL (ref >39)
LDL Calculated: 95 mg/dL (ref 0–99)
TRIGLYCERIDES: 40 mg/dL (ref 0–149)
VLDL CHOLESTEROL CAL: 8 mg/dL (ref 5–40)

## 2018-01-02 ENCOUNTER — Ambulatory Visit: Payer: Self-pay | Admitting: Nurse Practitioner

## 2018-01-30 ENCOUNTER — Ambulatory Visit: Payer: Self-pay | Admitting: Nurse Practitioner

## 2018-08-01 ENCOUNTER — Other Ambulatory Visit: Payer: Self-pay | Admitting: Nurse Practitioner

## 2018-09-29 ENCOUNTER — Other Ambulatory Visit: Payer: Self-pay | Admitting: Nurse Practitioner

## 2018-11-07 ENCOUNTER — Other Ambulatory Visit: Payer: Self-pay | Admitting: Nurse Practitioner

## 2019-02-05 ENCOUNTER — Other Ambulatory Visit: Payer: Self-pay | Admitting: Nurse Practitioner

## 2019-03-16 ENCOUNTER — Other Ambulatory Visit: Payer: Self-pay | Admitting: Nurse Practitioner

## 2019-05-08 ENCOUNTER — Other Ambulatory Visit: Payer: Self-pay | Admitting: Nurse Practitioner

## 2019-08-09 DIAGNOSIS — Z9289 Personal history of other medical treatment: Secondary | ICD-10-CM

## 2019-08-09 HISTORY — DX: Personal history of other medical treatment: Z92.89

## 2019-08-25 ENCOUNTER — Inpatient Hospital Stay (HOSPITAL_BASED_OUTPATIENT_CLINIC_OR_DEPARTMENT_OTHER)
Admission: EM | Admit: 2019-08-25 | Discharge: 2019-08-27 | DRG: 760 | Disposition: A | Discharge status: Home / Self Care | Payer: Self-pay | Attending: Family Medicine | Admitting: Family Medicine

## 2019-08-25 ENCOUNTER — Other Ambulatory Visit: Payer: Self-pay

## 2019-08-25 ENCOUNTER — Emergency Department (HOSPITAL_BASED_OUTPATIENT_CLINIC_OR_DEPARTMENT_OTHER): Payer: Self-pay

## 2019-08-25 ENCOUNTER — Encounter (HOSPITAL_BASED_OUTPATIENT_CLINIC_OR_DEPARTMENT_OTHER): Payer: Self-pay | Admitting: Emergency Medicine

## 2019-08-25 DIAGNOSIS — R002 Palpitations: Secondary | ICD-10-CM

## 2019-08-25 DIAGNOSIS — Z808 Family history of malignant neoplasm of other organs or systems: Secondary | ICD-10-CM

## 2019-08-25 DIAGNOSIS — D649 Anemia, unspecified: Secondary | ICD-10-CM | POA: Diagnosis present

## 2019-08-25 DIAGNOSIS — D219 Benign neoplasm of connective and other soft tissue, unspecified: Secondary | ICD-10-CM

## 2019-08-25 DIAGNOSIS — Z833 Family history of diabetes mellitus: Secondary | ICD-10-CM

## 2019-08-25 DIAGNOSIS — Z886 Allergy status to analgesic agent status: Secondary | ICD-10-CM

## 2019-08-25 DIAGNOSIS — N920 Excessive and frequent menstruation with regular cycle: Secondary | ICD-10-CM

## 2019-08-25 DIAGNOSIS — E876 Hypokalemia: Secondary | ICD-10-CM | POA: Diagnosis present

## 2019-08-25 DIAGNOSIS — Z8249 Family history of ischemic heart disease and other diseases of the circulatory system: Secondary | ICD-10-CM

## 2019-08-25 DIAGNOSIS — Z825 Family history of asthma and other chronic lower respiratory diseases: Secondary | ICD-10-CM

## 2019-08-25 DIAGNOSIS — Z8379 Family history of other diseases of the digestive system: Secondary | ICD-10-CM

## 2019-08-25 DIAGNOSIS — F419 Anxiety disorder, unspecified: Secondary | ICD-10-CM | POA: Diagnosis present

## 2019-08-25 DIAGNOSIS — Z20822 Contact with and (suspected) exposure to covid-19: Secondary | ICD-10-CM | POA: Diagnosis present

## 2019-08-25 DIAGNOSIS — D259 Leiomyoma of uterus, unspecified: Principal | ICD-10-CM | POA: Diagnosis present

## 2019-08-25 DIAGNOSIS — Z882 Allergy status to sulfonamides status: Secondary | ICD-10-CM

## 2019-08-25 DIAGNOSIS — D62 Acute posthemorrhagic anemia: Secondary | ICD-10-CM | POA: Diagnosis present

## 2019-08-25 DIAGNOSIS — Z91040 Latex allergy status: Secondary | ICD-10-CM

## 2019-08-25 DIAGNOSIS — Z880 Allergy status to penicillin: Secondary | ICD-10-CM

## 2019-08-25 DIAGNOSIS — Z8042 Family history of malignant neoplasm of prostate: Secondary | ICD-10-CM

## 2019-08-25 LAB — TROPONIN I (HIGH SENSITIVITY): Troponin I (High Sensitivity): 2 ng/L (ref <18)

## 2019-08-25 LAB — BASIC METABOLIC PANEL
Anion gap: 7 (ref 5–15)
BUN: 9 mg/dL (ref 6–20)
CO2: 25 mmol/L (ref 22–32)
Calcium: 8.8 mg/dL — ABNORMAL LOW (ref 8.9–10.3)
Chloride: 105 mmol/L (ref 98–111)
Creatinine, Ser: 0.73 mg/dL (ref 0.44–1.00)
GFR calc Af Amer: >60 mL/min (ref >60)
GFR calc non Af Amer: >60 mL/min (ref >60)
Glucose, Bld: 113 mg/dL — ABNORMAL HIGH (ref 70–99)
Potassium: 3.1 mmol/L — ABNORMAL LOW (ref 3.5–5.1)
Sodium: 137 mmol/L (ref 135–145)

## 2019-08-25 LAB — MAGNESIUM: Magnesium: 2.1 mg/dL (ref 1.7–2.4)

## 2019-08-25 NOTE — ED Triage Notes (Signed)
Reports feeling like heart is racing for a few days.  Also endorses a headache that started today.

## 2019-08-25 NOTE — ED Notes (Signed)
RN@bedside  drawing labs

## 2019-08-25 NOTE — ED Provider Notes (Signed)
MSE was initiated and I personally evaluated the patient and placed orders (if any) at  10:46 PM on August 25, 2019.  The patient appears stable so that the remainder of the MSE may be completed by another provider.   Tachycardia, palpitations.  Currently on menstrual cycle, heavy but similar to regular period. Well appearing but somewhat pale on exam. No other vital sign abnormality.  Will check basic labs, ekg, cxr See Dr. Christy Gentles note for full EDP note   Lucrezia Starch, MD 08/25/19 2249

## 2019-08-26 DIAGNOSIS — D649 Anemia, unspecified: Secondary | ICD-10-CM | POA: Diagnosis present

## 2019-08-26 DIAGNOSIS — R002 Palpitations: Secondary | ICD-10-CM

## 2019-08-26 LAB — CBC WITH DIFFERENTIAL/PLATELET
Abs Immature Granulocytes: 0.02 10*3/uL (ref 0.00–0.07)
Basophils Absolute: 0 10*3/uL (ref 0.0–0.1)
Basophils Relative: 0 %
Eosinophils Absolute: 0.2 10*3/uL (ref 0.0–0.5)
Eosinophils Relative: 2 %
HCT: 19.7 % — ABNORMAL LOW (ref 36.0–46.0)
Hemoglobin: 4.6 g/dL — CL (ref 12.0–15.0)
Immature Granulocytes: 0 %
Lymphocytes Relative: 42 %
Lymphs Abs: 3 10*3/uL (ref 0.7–4.0)
MCH: 13.6 pg — ABNORMAL LOW (ref 26.0–34.0)
MCHC: 23.4 g/dL — ABNORMAL LOW (ref 30.0–36.0)
MCV: 58.5 fL — ABNORMAL LOW (ref 80.0–100.0)
Monocytes Absolute: 0.8 10*3/uL (ref 0.1–1.0)
Monocytes Relative: 10 %
Neutro Abs: 3.3 10*3/uL (ref 1.7–7.7)
Neutrophils Relative %: 46 %
Platelets: 460 10*3/uL — ABNORMAL HIGH (ref 150–400)
RBC: 3.37 MIL/uL — ABNORMAL LOW (ref 3.87–5.11)
RDW: 24.7 % — ABNORMAL HIGH (ref 11.5–15.5)
WBC: 7.2 10*3/uL (ref 4.0–10.5)
nRBC: 0 % (ref 0.0–0.2)

## 2019-08-26 LAB — URINALYSIS, ROUTINE W REFLEX MICROSCOPIC
Bilirubin Urine: NEGATIVE
Glucose, UA: NEGATIVE mg/dL
Ketones, ur: 15 mg/dL — AB
Nitrite: NEGATIVE
Protein, ur: NEGATIVE mg/dL
Specific Gravity, Urine: 1.025 (ref 1.005–1.030)
pH: 6 (ref 5.0–8.0)

## 2019-08-26 LAB — RETICULOCYTES
Immature Retic Fract: 20.2 % — ABNORMAL HIGH (ref 2.3–15.9)
RBC.: 3.04 MIL/uL — ABNORMAL LOW (ref 3.87–5.11)
Retic Count, Absolute: 57.5 10*3/uL (ref 19.0–186.0)
Retic Ct Pct: 1.9 % (ref 0.4–3.1)

## 2019-08-26 LAB — BASIC METABOLIC PANEL
Anion gap: 6 (ref 5–15)
BUN: 10 mg/dL (ref 6–20)
CO2: 24 mmol/L (ref 22–32)
Calcium: 8.3 mg/dL — ABNORMAL LOW (ref 8.9–10.3)
Chloride: 110 mmol/L (ref 98–111)
Creatinine, Ser: 0.73 mg/dL (ref 0.44–1.00)
GFR calc Af Amer: >60 mL/min (ref >60)
GFR calc non Af Amer: >60 mL/min (ref >60)
Glucose, Bld: 94 mg/dL (ref 70–99)
Potassium: 3.5 mmol/L (ref 3.5–5.1)
Sodium: 140 mmol/L (ref 135–145)

## 2019-08-26 LAB — PREGNANCY, URINE: Preg Test, Ur: NEGATIVE

## 2019-08-26 LAB — IRON AND TIBC
Iron: 11 ug/dL — ABNORMAL LOW (ref 28–170)
Saturation Ratios: 3 % — ABNORMAL LOW (ref 10.4–31.8)
TIBC: 436 ug/dL (ref 250–450)
UIBC: 425 ug/dL

## 2019-08-26 LAB — URINALYSIS, MICROSCOPIC (REFLEX): RBC / HPF: >50 RBC/hpf (ref 0–5)

## 2019-08-26 LAB — HIV ANTIBODY (ROUTINE TESTING W REFLEX): HIV Screen 4th Generation wRfx: NONREACTIVE

## 2019-08-26 LAB — FOLATE: Folate: 17.3 ng/mL (ref >5.9)

## 2019-08-26 LAB — PREPARE RBC (CROSSMATCH)

## 2019-08-26 LAB — VITAMIN B12: Vitamin B-12: 358 pg/mL (ref 180–914)

## 2019-08-26 LAB — SARS CORONAVIRUS 2 (TAT 6-24 HRS): SARS Coronavirus 2: NEGATIVE

## 2019-08-26 LAB — FERRITIN: Ferritin: 1 ng/mL — ABNORMAL LOW (ref 11–307)

## 2019-08-26 MED ORDER — POTASSIUM CHLORIDE CRYS ER 20 MEQ PO TBCR
40.0000 meq | EXTENDED_RELEASE_TABLET | Freq: Once | ORAL | Status: AC
  Administered 2019-08-26: 40 meq via ORAL
  Filled 2019-08-26: qty 2

## 2019-08-26 MED ORDER — ACETAMINOPHEN 325 MG PO TABS: 650.0000 mg | ORAL_TABLET | Freq: Four times a day (QID) | ORAL | Status: DC | PRN

## 2019-08-26 MED ORDER — SODIUM CHLORIDE 0.9 % IV BOLUS (SEPSIS)
1000.0000 mL | Freq: Once | INTRAVENOUS | Status: AC
  Administered 2019-08-26: 1000 mL via INTRAVENOUS

## 2019-08-26 MED ORDER — ONDANSETRON HCL 4 MG PO TABS: 4.0000 mg | ORAL_TABLET | Freq: Four times a day (QID) | ORAL | Status: DC | PRN

## 2019-08-26 MED ORDER — ACETAMINOPHEN 650 MG RE SUPP: 650.0000 mg | Freq: Four times a day (QID) | RECTAL | Status: DC | PRN

## 2019-08-26 MED ORDER — DOCUSATE SODIUM 100 MG PO CAPS
100.0000 mg | ORAL_CAPSULE | Freq: Two times a day (BID) | ORAL | Status: DC
  Administered 2019-08-26 – 2019-08-27 (×2): 100 mg via ORAL
  Filled 2019-08-26 (×2): qty 1

## 2019-08-26 MED ORDER — SODIUM CHLORIDE 0.9% IV SOLUTION
Freq: Once | INTRAVENOUS | Status: DC
  Filled 2019-08-26: qty 250

## 2019-08-26 MED ORDER — CALCIUM CARBONATE ANTACID 500 MG PO CHEW
1.0000 | CHEWABLE_TABLET | Freq: Three times a day (TID) | ORAL | Status: DC | PRN
  Administered 2019-08-26: 200 mg via ORAL
  Filled 2019-08-26: qty 1

## 2019-08-26 MED ORDER — ONDANSETRON HCL 4 MG/2ML IJ SOLN: 4.0000 mg | Freq: Four times a day (QID) | INTRAMUSCULAR | Status: DC | PRN

## 2019-08-26 NOTE — Plan of Care (Addendum)
Pt with symptomatic anemia from chronic menorrhagia apparently,  HGB 4.6, tachycardia.  No beds anywhere SDU, TELE, or med surge, im told we only have COVID beds (pt doesn't have COVID).  Will put in for Tele bed, sounds like she might end up going ER to ER so we can get her blood to stabilize her.  Will also order 3u PRBC transfusion for whenever she gets over here (anywhere).

## 2019-08-26 NOTE — Progress Notes (Signed)
Patient seen and examined at bedside, patient admitted after midnight, please see earlier detailed admission note by Etta Quill, DO. Briefly, patient presented secondary to palpitations and found to have profound anemia in the setting of menorrhagia and uterine fibroids. 3 units of PRBC ordered on admission. Continue current management. Will obtain post transfusion H&H. Hopeful discharge in AM if symptoms improved. Discussed outpatient gynecology follow-up.   Cordelia Poche, MD [facility] 08/26/2019, 12:30 PM

## 2019-08-26 NOTE — ED Notes (Signed)
Lab called with a Hgb of 4.6. Dr. Christy Gentles and primary RN aware.

## 2019-08-26 NOTE — ED Provider Notes (Signed)
[facility] Provider Note   CSN: [id number] Arrival date & time: 08/25/19  2224     History Chief Complaint  Patient presents with  . Tachycardia    Teresa Hudson is a 44 y.o. female.  The history is provided by the patient.  Palpitations Palpitations quality:  Fast Timing:  Constant Progression:  Worsening Chronicity:  New Context: anxiety   Relieved by:  Nothing Worsened by:  Nothing Associated symptoms: shortness of breath   Associated symptoms: no chest pain, no syncope and no vomiting   Risk factors: no heart disease and no hx of PE   Patient presents for palpitations. She reports over the past day she felt that her heart is racing No chest pain. She does have shortness of breath and lightheadedness. No syncope. She reports monthly heavy menstrual cycles but has not treated them. She is having some bleeding at this time that is improving     Past Medical History:  Diagnosis Date  . Anemia 1997  . Chlamydia   . Fibroids   . Fibroids   . Hypertension 2012 dx  . 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 Acne 06/25/2016  . Vitamin D insufficiency 10/20/2014  . Allergic rhinitis 10/18/2014  . Fatigue 01/29/2014  . Liver lesion, right lobe 01/29/2014  . 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 Hypertension 12/05/2012  . 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 DILATION AND CURETTAGE OF UTERUS  1999  . wisdom tooth removal       OB History    Gravida  5   Para  3   Term  3   Preterm      AB  2   Living  3     SAB      TAB  2   Ectopic      Multiple      Live Births  3           Family History  Problem Relation Age of Onset  . Cancer Mother        breast  . Diabetes Mother   . Cancer Father        prostate  .  Hypertension Father   . Asthma Sister   . GI problems Sister   . Thyroid cancer Sister     Social History   Tobacco Use  . Smoking status: Never Smoker  . Smokeless tobacco: Never Used  Substance Use Topics  . Alcohol use: Yes    Comment: ocassionally   . Drug use: No    Home Medications Prior to Admission medications   Medication Sig Start Date End Date Taking? Authorizing Provider  hydrochlorothiazide (HYDRODIURIL) 12.5 MG tablet Take 1 tablet (12.5 mg total) by mouth daily. Patient not taking: Reported on 11/30/2017 06/25/16 08/26/19  Boykin Nearing, MD  potassium chloride SA (K-DUR,KLOR-CON) 20 MEQ tablet Take 1 tablet (20 mEq total) by mouth 2 (two) times daily. Patient not taking: Reported on 11/30/2017 03/03/13 08/26/19  Palumbo, April, MD    Allergies    Sulfa antibiotics, Aspirin, Latex, Penicillins, and Nickel  Review of Systems   Review of Systems  Constitutional: Negative for fever.  Respiratory: Positive for shortness of breath.   Cardiovascular: Positive for palpitations. Negative for chest pain and syncope.  Gastrointestinal:  Negative for vomiting.  Genitourinary: Positive for vaginal bleeding.  Neurological: Positive for light-headedness. Negative for syncope.  All other systems reviewed and are negative.   Physical Exam Updated Vital Signs BP 129/87   Pulse (!) 107   Temp 98.5 F (36.9 C) (Oral)   Resp 20   Ht 1.651 m (5\' 5" )   Wt 80.7 kg   LMP 08/20/2019   SpO2 100%   BMI 29.62 kg/m   Physical Exam CONSTITUTIONAL: Well developed/well nourished HEAD: Normocephalic/atraumatic EYES: EOMI/PERRL, conjunctiva pale ENMT: Mucous membranes moist NECK: supple no meningeal signs SPINE/BACK:entire spine nontender CV: S1/S2 noted, no murmurs/rubs/gallops noted, tachycardic LUNGS: Lungs are clear to auscultation bilaterally, no apparent distress ABDOMEN: soft, nontender, no rebound or guarding, bowel sounds noted throughout abdomen GU:no cva  tenderness NEURO: Pt is awake/alert/appropriate, moves all extremitiesx4.  No facial droop.  EXTREMITIES: pulses normal/equal, full ROM SKIN: warm, pale PSYCH: no abnormalities of mood noted, alert and oriented to situation  ED Results / Procedures / Treatments   Labs (all labs ordered are listed, but only abnormal results are displayed) Labs Reviewed  CBC WITH DIFFERENTIAL/PLATELET - Abnormal; Notable for the following components:      Result Value   RBC 3.37 (*)    Hemoglobin 4.6 (*)    HCT 19.7 (*)    MCV 58.5 (*)    MCH 13.6 (*)    MCHC 23.4 (*)    RDW 24.7 (*)    Platelets 460 (*)    All other components within normal limits  BASIC METABOLIC PANEL - Abnormal; Notable for the following components:   Potassium 3.1 (*)    Glucose, Bld 113 (*)    Calcium 8.8 (*)    All other components within normal limits  URINALYSIS, ROUTINE W REFLEX MICROSCOPIC - Abnormal; Notable for the following components:   APPearance HAZY (*)    Hgb urine dipstick LARGE (*)    Ketones, ur 15 (*)    Leukocytes,Ua TRACE (*)    All other components within normal limits  URINALYSIS, MICROSCOPIC (REFLEX) - Abnormal; Notable for the following components:   Bacteria, UA MANY (*)    All other components within normal limits  SARS CORONAVIRUS 2 (TAT 6-24 HRS)  MAGNESIUM  PREGNANCY, URINE  TROPONIN I (HIGH SENSITIVITY)    EKG EKG Interpretation  Date/Time:  Saturday August 25 2019 22:38:16 EDT Ventricular Rate:  129 PR Interval:    QRS Duration: 81 QT Interval:  315 QTC Calculation: 462 R Axis:   14 Text Interpretation: Sinus tachycardia Probable anteroseptal infarct, old Confirmed by Madalyn Rob [phone]) on 08/25/2019 10:42:35 PM   Radiology DG Chest 2 View  Result Date: 08/25/2019 CLINICAL DATA:  Heart palpitations EXAM: CHEST - 2 VIEW COMPARISON:  07/29/2013 FINDINGS: The heart size and mediastinal contours are within normal limits. Both lungs are clear. The visualized skeletal structures  are unremarkable. IMPRESSION: No active cardiopulmonary disease. Electronically Signed   By: Ulyses Jarred M.D.   On: 08/25/2019 23:49    Procedures .Critical Care Performed by: Ripley Fraise, MD Authorized by: Ripley Fraise, MD   Critical care provider statement:    Critical care time (minutes):  40   Critical care start time:  08/26/2019 12:40 AM   Critical care end time:  08/26/2019 1:20 AM   Critical care time was exclusive of:  Separately billable procedures and treating other patients   Critical care was necessary to treat or prevent imminent or life-threatening deterioration of the following conditions:  Shock  and cardiac failure   Critical care was time spent personally by me on the following activities:  Discussions with consultants, development of treatment plan with patient or surrogate, evaluation of patient's response to treatment, pulse oximetry, ordering and review of laboratory studies, ordering and performing treatments and interventions and re-evaluation of patient's condition   I assumed direction of critical care for this patient from another provider in my specialty: no      Medications Ordered in ED Medications  0.9 %  sodium chloride infusion (Manually program via Guardrails IV Fluids) (has no administration in time range)  potassium chloride SA (KLOR-CON) CR tablet 40 mEq (40 mEq Oral Given 08/26/19 0030)  sodium chloride 0.9 % bolus 1,000 mL (1,000 mLs Intravenous New Bag/Given 08/26/19 0054)    ED Course  I have reviewed the triage vital signs and the nursing notes.  Pertinent labs  results that were available during my care of the patient were reviewed by me and considered in my medical decision making (see chart for details).    MDM Rules/Calculators/A&P                      1:22 AM Patient presents with palpitations that have been worsening. She denies any chest pain.  EKG revealed sinus tachycardia.  Initial troponin was negative.  At this point  acute coronary syndrome is low.  No hypoxia to suggest acute pulmonary embolism However patient does have acute anemia.  Hemoglobin has dropped to 4.6 which is a critical level.  She is symptomatic from her anemia.  Patient will require admission for monitoring and transfusion Patient accepts blood transfusion 1:24 AM Discussed the case with Dr. Alcario Drought with [facility].  Plan admit to the hospital for blood transfusion.  Patient will also require gynecology consult due to menometrorrhagia.  Patient reports the bleeding is improved at this time 1:32 AM Plan will be to transfer the patient to [facility] emergency department. While awaiting admission to the telemetry service, patient will have a type and screen ordered and then will have a blood transfusion.  This was discussed with Dr. Roxanne Mins in the emergency department at [facility]     This patient presents to the ED for concern of palpiation, this involves an extensive number of treatment options, and is a complaint that carries with it a high risk of complications and morbidity.  The differential diagnosis includes PE, ACS, dysrhythmia, anemia, atrial fibrillation   Lab Tests:   I Ordered, reviewed, and interpreted labs, which included cbc, metabolic panel, pregnancy test  Medicines ordered:   I ordered medication IV fluids for tachycardia    Additional history obtained:    Previous records obtained and reviewed   Consultations Obtained:   I consulted prior hospitalist Dr. Alcario Drought and discussed lab  findings  Reevaluation:  After the interventions stated above, I reevaluated the patient and found stable  Critical Interventions:  . Admission for blood transfusion  Final Clinical Impression(s) / ED Diagnoses Final diagnoses:  Palpitations  Hypokalemia  Acute anemia    Rx / DC Orders ED Discharge Orders    None       Ripley Fraise, MD 08/26/19 [phone]

## 2019-08-26 NOTE — ED Notes (Signed)
[facility] notified (Jaime) - patient ready for transport 

## 2019-08-26 NOTE — Progress Notes (Signed)
Teresa Poche, MD paged d/t pt's c/o indigestion. Pt requesting tums.

## 2019-08-26 NOTE — H&P (Signed)
History and Physical    Teresa Hudson [id number] DOB: 1975-08-01 DOA: 08/25/2019  PCP: System, Pcp Not In  Patient coming from: Home  I have personally briefly reviewed patient's old medical records in [place]  Chief Complaint: Palpitations  HPI: Teresa Hudson is a 44 y.o. female with medical history significant of HTN, fibroids, LSIL.  Pt presents to the ED at [facility] with c/o palpitations.  Symptoms ongoing vover past day or so.  Associated SOB and lightheadedness.  Has monthly heavy menstrual cycles but hasnt treated these.  Some ongoing bleeding at this time that is improving.  No stigmata of GIB.   ED Course: HGB 4.6, tachycardic up to 130 with activity.   Review of Systems: As per HPI, otherwise all review of systems negative.  Past Medical History:  Diagnosis Date  . Anemia 1997  . Chlamydia   . Fibroids   . Fibroids   . Hypertension 2012 dx  . LSIL (low grade squamous intraepithelial lesion) on Pap smear 01/24/2012   Colpo 01/24/12    Past Surgical History:  Procedure Laterality Date  . DILATION AND CURETTAGE OF UTERUS  1999  . wisdom tooth removal       reports that she has never smoked. She has never used smokeless tobacco. She reports current alcohol use. She reports that she does not use drugs.  Allergies  Allergen Reactions  . Sulfa Antibiotics Anaphylaxis  . Aspirin Swelling  . Latex Itching and Swelling  . Penicillins Swelling    Facial swelling  . Nickel Rash    Family History  Problem Relation Age of Onset  . Cancer Mother        breast  . Diabetes Mother   . Cancer Father        prostate  . Hypertension Father   . Asthma Sister   . GI problems Sister   . Thyroid cancer Sister      Prior to Admission medications   Medication Sig Start Date End Date Taking? Authorizing Provider  hydrochlorothiazide (HYDRODIURIL) 12.5 MG tablet Take 1 tablet (12.5 mg total) by mouth daily. Patient not taking: Reported on 11/30/2017  06/25/16 08/26/19  Boykin Nearing, MD  potassium chloride SA (K-DUR,KLOR-CON) 20 MEQ tablet Take 1 tablet (20 mEq total) by mouth 2 (two) times daily. Patient not taking: Reported on 11/30/2017 03/03/13 08/26/19  Randal Buba, April, MD    Physical Exam: Vitals:   08/26/19 0102 08/26/19 0130 08/26/19 0220 08/26/19 0243  BP: 135/83 128/81 128/81 (!) 134/92  Pulse: (!) 105 (!) 107 (!) 110 (!) 112  Resp: 18 17 16 18   Temp:    99.1 F (37.3 C)  TempSrc:    Oral  SpO2: 100% 100% 100% 100%  Weight:    79 kg  Height:    5\' 5"  (1.651 m)    Constitutional: NAD, calm, comfortable Eyes: PERRL, lids and conjunctivae normal ENMT: Mucous membranes are moist. Posterior pharynx clear of any exudate or lesions.Normal dentition.  Neck: normal, supple, no masses, no thyromegaly Respiratory: clear to auscultation bilaterally, no wheezing, no crackles. Normal respiratory effort. No accessory muscle use.  Cardiovascular: Regular rate and rhythm, no murmurs / rubs / gallops. No extremity edema. 2+ pedal pulses. No carotid bruits.  Abdomen: no tenderness, no masses palpated. No hepatosplenomegaly. Bowel sounds positive.  Musculoskeletal: no clubbing / cyanosis. No joint deformity upper and lower extremities. Good ROM, no contractures. Normal muscle tone.  Skin: no rashes, lesions, ulcers. No induration Neurologic: CN 2-12  grossly intact. Sensation intact, DTR normal. Strength 5/5 in all 4.  Psychiatric: Normal judgment and insight. Alert and oriented x 3. Normal mood.    Labs on Admission: I have personally reviewed following labs and imaging studies  CBC: Recent Labs  Lab 08/25/19 2312  WBC 7.2  NEUTROABS 3.3  HGB 4.6*  HCT 19.7*  MCV 58.5*  PLT 123456*   Basic Metabolic Panel: Recent Labs  Lab 08/25/19 2312  NA 137  K 3.1*  CL 105  CO2 25  GLUCOSE 113*  BUN 9  CREATININE 0.73  CALCIUM 8.8*  MG 2.1   GFR: Estimated Creatinine Clearance: 96.1 mL/min (by C-G formula based on SCr of 0.73  mg/dL). Liver Function Tests: No results for input(s): AST, ALT, ALKPHOS, BILITOT, PROT, ALBUMIN in the last 168 hours. No results for input(s): LIPASE, AMYLASE in the last 168 hours. No results for input(s): AMMONIA in the last 168 hours. Coagulation Profile: No results for input(s): INR, PROTIME in the last 168 hours. Cardiac Enzymes: No results for input(s): CKTOTAL, CKMB, CKMBINDEX, TROPONINI in the last 168 hours. BNP (last 3 results) No results for input(s): PROBNP in the last 8760 hours. HbA1C: No results for input(s): HGBA1C in the last 72 hours. CBG: No results for input(s): GLUCAP in the last 168 hours. Lipid Profile: No results for input(s): CHOL, HDL, LDLCALC, TRIG, CHOLHDL, LDLDIRECT in the last 72 hours. Thyroid Function Tests: No results for input(s): TSH, T4TOTAL, FREET4, T3FREE, THYROIDAB in the last 72 hours. Anemia Panel: No results for input(s): VITAMINB12, FOLATE, FERRITIN, TIBC, IRON, RETICCTPCT in the last 72 hours. Urine analysis:    Component Value Date/Time   COLORURINE YELLOW 08/26/2019 0029   APPEARANCEUR HAZY (A) 08/26/2019 0029   LABSPEC 1.025 08/26/2019 0029   PHURINE 6.0 08/26/2019 0029   GLUCOSEU NEGATIVE 08/26/2019 0029   HGBUR LARGE (A) 08/26/2019 0029   BILIRUBINUR NEGATIVE 08/26/2019 0029   KETONESUR 15 (A) 08/26/2019 0029   PROTEINUR NEGATIVE 08/26/2019 0029   UROBILINOGEN 1.0 07/29/2013 1803   NITRITE NEGATIVE 08/26/2019 0029   LEUKOCYTESUR TRACE (A) 08/26/2019 0029    Radiological Exams on Admission: DG Chest 2 View  Result Date: 08/25/2019 CLINICAL DATA:  Heart palpitations EXAM: CHEST - 2 VIEW COMPARISON:  07/29/2013 FINDINGS: The heart size and mediastinal contours are within normal limits. Both lungs are clear. The visualized skeletal structures are unremarkable. IMPRESSION: No active cardiopulmonary disease. Electronically Signed   By: Ulyses Jarred M.D.   On: 08/25/2019 23:49    EKG: Independently  reviewed.  Assessment/Plan Active Problems:   Symptomatic anemia    1. Symptomatic anemia - 1. Hypochromic and microcytic 2. Anemia pnl - though suspect iron def anemia from chronic menorrhagia to blame based on reported history. 3. Tele monitor 4. Transfuse 3u PRBC 5. H/H post transfusion 6. May want gyn consult in AM  DVT prophylaxis: SCDs Code Status: Full Family Communication: No family in room Disposition Plan: Home after stabilized from anemia standpoint Consults called: None Admission status: Place in 83   Vraj Denardo, [place] [facility]  How to contact the [facility] Attending or Consulting provider [place] or covering provider during after hours [place], for this patient?  1. Check the care team in [facility] and look for a) attending/consulting TRH provider listed and b) the [facility] team listed 2. Log into [email or web address]  Amion Physician Scheduling and messaging for groups and whole hospitals  On call and physician scheduling software for group practices, residents, hospitalists and  other medical providers for call, clinic, rotation and shift schedules. OnCall Enterprise is a hospital-wide system for scheduling doctors and paging doctors on call. EasyPlot is for scientific plotting and data analysis.  [email or web address]  and use [place]'s universal password to access. If you do not have the password, please contact the hospital operator.  3. Locate the [facility] provider you are looking for under [facility] and page to a number that you can be directly reached. 4. If you still have difficulty reaching the provider, please page the [facility] (Director on Call) for the Hospitalists listed on amion for assistance.  08/26/2019, 2:47 AM

## 2019-08-27 DIAGNOSIS — N920 Excessive and frequent menstruation with regular cycle: Secondary | ICD-10-CM

## 2019-08-27 DIAGNOSIS — D219 Benign neoplasm of connective and other soft tissue, unspecified: Secondary | ICD-10-CM

## 2019-08-27 LAB — HEMOGLOBIN AND HEMATOCRIT, BLOOD
HCT: 29.2 % — ABNORMAL LOW (ref 36.0–46.0)
Hemoglobin: 8.1 g/dL — ABNORMAL LOW (ref 12.0–15.0)

## 2019-08-27 MED ORDER — FERROUS GLUCONATE 324 (38 FE) MG PO TABS: ORAL_TABLET | ORAL | 0 refills | Status: DC

## 2019-08-27 MED ORDER — MEGESTROL ACETATE 40 MG PO TABS: 40.0000 mg | ORAL_TABLET | Freq: Two times a day (BID) | ORAL | 0 refills | Status: DC

## 2019-08-27 MED ORDER — DOCUSATE SODIUM 100 MG PO CAPS: 100.0000 mg | ORAL_CAPSULE | Freq: Two times a day (BID) | ORAL | 0 refills | Status: DC

## 2019-08-27 NOTE — TOC Transition Note (Signed)
Transition of Care [facility]) - CM/SW Discharge Note   Patient Details  Name: Teresa Hudson MRN: [id number] Date of Birth: [birth year]  [facility] CM/SW Contact:  Dessa Phi, RN Phone Number: 08/27/2019, 12:24 PM   Clinical Narrative: CM referral for no insurance-Financial counsler will contact patient even after d/c to f/u on medicaid potential. Provided patient w/[facility] pcp to make own appt per patient request. No further CM needs.      Final next level of care: Home/Self Care Barriers to Discharge: No Barriers Identified   Patient Goals and CMS Choice        Discharge Placement                       Discharge Plan and Services   Discharge Planning Services: CM Consult                                 Social Determinants of Health (SDOH) Interventions     Readmission Risk Interventions No flowsheet data found.

## 2019-08-27 NOTE — Discharge Summary (Signed)
Physician Discharge Summary  Teresa Hudson [id number] DOB: Feb 01, 1976 DOA: 08/25/2019  PCP: System, Pcp Not In  Admit date: 08/25/2019 Discharge date: 08/27/2019  Admitted From: Home Disposition: Home  Recommendations for Outpatient Follow-up:  1. Follow up with PCP in 1 week (Patient will need to establish care) 2. Follow up with OB/Gyn (discussed with gynecology who will make an appointment) 3. Please obtain CBC in one week 4. Please follow up on the following pending results: None  Home Health: None Equipment/Devices: None  Discharge Condition: Stable CODE STATUS: Full code Diet recommendation: Regular diet   Brief/Interim Summary:  Admission HPI written by Etta Quill, DO   Chief Complaint: Palpitations  HPI: Teresa Hudson is a 44 y.o. female with medical history significant of HTN, fibroids, LSIL.  Pt presents [facility] the ED at [facility] with c/o palpitations.  Symptoms ongoing vover past day or so.  Associated SOB and lightheadedness.  Has monthly heavy menstrual cycles but hasnt treated these.  Some ongoing bleeding at this time that is improving.    Hospital course:  Symptomatic anemia Acute on chronic blood loss anemia Secondary to menorrhagia. Hemoglobin of 4.6 on admission. 3 units of PRBC transfused with an improvement of hemoglobin up to 8.1. Patient still slightly fatigued but much improved. Discussed with gynecology who recommended megace. Will discharge on iron supplementation and megace. Outpatient follow-up with gynecology.  Fibroids History per patient. On chart review, ultrasound from 12/16/2010 confirms diagnosis. Likely cause of above. Outpatient follow-up.  Discharge Diagnoses:  Principal Problem:   Symptomatic anemia Active Problems:   Menorrhagia   Fibroids    Discharge Instructions  Discharge Instructions    Diet - low sodium heart healthy   Complete by: As directed    Increase activity slowly   Complete by: As directed       Allergies as of 08/27/2019      Reactions   Sulfa Antibiotics Anaphylaxis   Aspirin Swelling   Latex Itching, Swelling   Penicillins Swelling   Facial swelling   Nickel Rash      Medication List    STOP taking these medications   hydrochlorothiazide 12.5 MG tablet Commonly known as: HYDRODIURIL   potassium chloride SA 20 MEQ tablet Commonly known as: KLOR-CON   tiZANidine 4 MG capsule Commonly known as: ZANAFLEX     TAKE these medications   docusate sodium 100 MG capsule Commonly known as: COLACE Take 1 capsule (100 mg total) by mouth 2 (two) times daily.   ferrous gluconate 324 MG tablet Commonly known as: FERGON Take 1 tablet (324 mg total) by mouth 2 (two) times daily with a meal for 14 days, THEN 1 tablet (324 mg total) daily with breakfast. Start taking on: August 27, 2019   megestrol 40 MG tablet Commonly known as: MEGACE Take 1 tablet (40 mg total) by mouth 2 (two) times daily. Take at the start of your period      Follow-up Information    [name], MD. Schedule an appointment as soon as possible for a visit in 1 week(s).   Specialty: Obstetrics and Gynecology Why: Heavy periods. Fibroids. Contact information: [place] [address] [phone]          Allergies  Allergen Reactions  . Sulfa Antibiotics Anaphylaxis  . Aspirin Swelling  . Latex Itching and Swelling  . Penicillins Swelling    Facial swelling  . Nickel Rash    Consultations:  None  Procedures/Studies: DG Chest 2 View  Result Date: 08/25/2019 CLINICAL DATA:  Heart palpitations EXAM: CHEST - 2 VIEW COMPARISON:  07/29/2013 FINDINGS: The heart size and mediastinal contours are within normal limits. Both lungs are clear. The visualized skeletal structures are unremarkable. IMPRESSION: No active cardiopulmonary disease. Electronically Signed   By: Ulyses Jarred M.D.   On: 08/25/2019 23:49       Subjective: No issues overnight. Bowel movement  yesterday.  Discharge Exam: Vitals:   08/26/19 2038 08/27/19 0433  BP: 126/87 130/86  Pulse: 80 81  Resp:  16  Temp: 98.9 F (37.2 C) 98.3 F (36.8 C)  SpO2: 100% 100%   Vitals:   08/26/19 1721 08/26/19 1748 08/26/19 2038 08/27/19 0433  BP: 128/86 127/82 126/87 130/86  Pulse: 91 91 80 81  Resp: 17 17  16   Temp: 98.2 F (36.8 C) 98.9 F (37.2 C) 98.9 F (37.2 C) 98.3 F (36.8 C)  TempSrc: Oral Oral Oral Oral  SpO2: 100% 100% 100% 100%  Weight:      Height:        General: Pt is alert, awake, not in acute distress Cardiovascular: RRR, S1/S2 +, no rubs, no gallops Respiratory: CTA bilaterally, no wheezing, no rhonchi Abdominal: Soft, NT, ND, bowel sounds + Extremities: no edema, no cyanosis    The results of significant diagnostics from this hospitalization (including imaging, microbiology, ancillary and laboratory) are listed below for reference.     Microbiology: Recent Results (from the past 240 hour(s))  SARS CORONAVIRUS 2 (TAT 6-24 HRS) Nasopharyngeal Nasopharyngeal Swab     Status: None   Collection Time: 08/26/19 12:29 AM   Specimen: Nasopharyngeal Swab  Result Value Ref Range Status   SARS Coronavirus 2 NEGATIVE NEGATIVE Final    Comment: (NOTE) SARS-CoV-2 target nucleic acids are NOT DETECTED. The SARS-CoV-2 RNA is generally detectable in upper and lower respiratory specimens during the acute phase of infection. Negative results do not preclude SARS-CoV-2 infection, do not rule out co-infections with other pathogens, and should not be used as the sole basis for treatment or other patient management decisions. Negative results must be combined with clinical observations, patient history, and epidemiological information. The expected result is Negative. Fact Sheet for Patients: SugarRoll.be Fact Sheet for Healthcare Providers: https://www.woods-mathews.com/ This test is not yet approved or cleared by the [place] FDA and  has been authorized for detection and/or diagnosis of SARS-CoV-2 by FDA under an Emergency Use Authorization (EUA). This EUA will remain  in effect (meaning this test can be used) for the duration of the COVID-19 declaration under Section 56 4(b)(1) of the Act, 21 U.S.C. section 360bbb-3(b)(1), unless the authorization is terminated or revoked sooner. Performed at [facility], [place] [address] [place], [place] [address]      Labs: BNP (last 3 results) No results for input(s): BNP in the last 8760 hours. Basic Metabolic Panel: Recent Labs  Lab 08/25/19 2312 08/26/19 0326  NA 137 140  K 3.1* 3.5  CL 105 110  CO2 25 24  GLUCOSE 113* 94  BUN 9 10  CREATININE 0.73 0.73  CALCIUM 8.8* 8.3*  MG 2.1  --    Liver Function Tests: No results for input(s): AST, ALT, ALKPHOS, BILITOT, PROT, ALBUMIN in the last 168 hours. No results for input(s): LIPASE, AMYLASE in the last 168 hours. No results for input(s): AMMONIA in the last 168 hours. CBC: Recent Labs  Lab 08/25/19 2312 08/26/19 2334  WBC 7.2  --  NEUTROABS 3.3  --   HGB 4.6* 8.1*  HCT 19.7* 29.2*  MCV 58.5*  --   PLT 460*  --    Cardiac Enzymes: No results for input(s): CKTOTAL, CKMB, CKMBINDEX, TROPONINI in the last 168 hours. BNP: Invalid input(s): POCBNP CBG: No results for input(s): GLUCAP in the last 168 hours. D-Dimer No results for input(s): DDIMER in the last 72 hours. Hgb A1c No results for input(s): HGBA1C in the last 72 hours. Lipid Profile No results for input(s): CHOL, HDL, LDLCALC, TRIG, CHOLHDL, LDLDIRECT in the last 72 hours. Thyroid function studies No results for input(s): TSH, T4TOTAL, T3FREE, THYROIDAB in the last 72 hours.  Invalid input(s): FREET3 Anemia work up Recent Labs    08/26/19 0326  VITAMINB12 358  FOLATE 17.3  FERRITIN 1*  TIBC 436  IRON 11*  RETICCTPCT 1.9   Urinalysis    Component Value Date/Time   COLORURINE YELLOW 08/26/2019 0029    APPEARANCEUR HAZY (A) 08/26/2019 0029   LABSPEC 1.025 08/26/2019 0029   PHURINE 6.0 08/26/2019 0029   GLUCOSEU NEGATIVE 08/26/2019 0029   HGBUR LARGE (A) 08/26/2019 0029   BILIRUBINUR NEGATIVE 08/26/2019 0029   KETONESUR 15 (A) 08/26/2019 0029   PROTEINUR NEGATIVE 08/26/2019 0029   UROBILINOGEN 1.0 07/29/2013 1803   NITRITE NEGATIVE 08/26/2019 0029   LEUKOCYTESUR TRACE (A) 08/26/2019 0029   Sepsis Labs Invalid input(s): PROCALCITONIN,  WBC,  LACTICIDVEN Microbiology Recent Results (from the past 240 hour(s))  SARS CORONAVIRUS 2 (TAT 6-24 HRS) Nasopharyngeal Nasopharyngeal Swab     Status: None   Collection Time: 08/26/19 12:29 AM   Specimen: Nasopharyngeal Swab  Result Value Ref Range Status   SARS Coronavirus 2 NEGATIVE NEGATIVE Final    Comment: (NOTE) SARS-CoV-2 target nucleic acids are NOT DETECTED. The SARS-CoV-2 RNA is generally detectable in upper and lower respiratory specimens during the acute phase of infection. Negative results do not preclude SARS-CoV-2 infection, do not rule out co-infections with other pathogens, and should not be used as the sole basis for treatment or other patient management decisions. Negative results must be combined with clinical observations, patient history, and epidemiological information. The expected result is Negative. Fact Sheet for Patients: SugarRoll.be Fact Sheet for Healthcare Providers: https://www.woods-mathews.com/ This test is not yet approved or cleared by the [place] FDA and  has been authorized for detection and/or diagnosis of SARS-CoV-2 by FDA under an Emergency Use Authorization (EUA). This EUA will remain  in effect (meaning this test can be used) for the duration of the COVID-19 declaration under Section 56 4(b)(1) of the Act, 21 U.S.C. section 360bbb-3(b)(1), unless the authorization is terminated or revoked sooner. Performed at [facility], [place] [address],  [place], [place] [address]      Time coordinating discharge: 35 minutes  SIGNED:   Cordelia Poche, MD [facility] 08/27/2019, 11:12 AM

## 2019-08-27 NOTE — Discharge Instructions (Addendum)
Teresa Hudson,  You were in the hospital because of anemia from vaginal bleeding. You received some blood. Please take iron on discharge. You will also be discharged with Megace to be taken when your periods start and to take during your periods; this should help to slow down the bleeding (if this medication is too expensive at the pharmacy, please browse on GoodRx.com for a coupon). Please follow-up with the OB/Gynecologist for your fibroids.

## 2019-08-30 LAB — TYPE AND SCREEN
ABO/RH(D): A POS
Antibody Screen: POSITIVE
PT AG Type: NEGATIVE
Unit division: 0
Unit division: 0
Unit division: 0
Unit division: 0

## 2019-08-30 LAB — BPAM RBC
Blood Product Expiration Date: 202105032359
Blood Product Expiration Date: 202105042359
Blood Product Expiration Date: 202105042359
Blood Product Expiration Date: 202105062359
ISSUE DATE / TIME: 202104161337
ISSUE DATE / TIME: 202104181019
ISSUE DATE / TIME: 202104181324
ISSUE DATE / TIME: 202104181707
Unit Type and Rh: 6200
Unit Type and Rh: 6200
Unit Type and Rh: 6200
Unit Type and Rh: 6200

## 2019-09-13 ENCOUNTER — Ambulatory Visit (INDEPENDENT_AMBULATORY_CARE_PROVIDER_SITE_OTHER): Payer: Self-pay | Admitting: Family Medicine

## 2019-09-13 ENCOUNTER — Other Ambulatory Visit (HOSPITAL_COMMUNITY)
Admission: RE | Admit: 2019-09-13 | Discharge: 2019-09-13 | Disposition: A | Discharge status: Home / Self Care | Payer: Self-pay | Source: Ambulatory Visit | Attending: Family Medicine | Admitting: Family Medicine

## 2019-09-13 ENCOUNTER — Other Ambulatory Visit: Payer: Self-pay

## 2019-09-13 ENCOUNTER — Encounter: Payer: Self-pay | Admitting: Family Medicine

## 2019-09-13 VITALS — BP 128/91 | HR 96 | Wt 172.3 lb

## 2019-09-13 DIAGNOSIS — Z124 Encounter for screening for malignant neoplasm of cervix: Secondary | ICD-10-CM | POA: Insufficient documentation

## 2019-09-13 DIAGNOSIS — R87612 Low grade squamous intraepithelial lesion on cytologic smear of cervix (LGSIL): Secondary | ICD-10-CM

## 2019-09-13 DIAGNOSIS — D649 Anemia, unspecified: Secondary | ICD-10-CM

## 2019-09-13 DIAGNOSIS — N920 Excessive and frequent menstruation with regular cycle: Secondary | ICD-10-CM | POA: Insufficient documentation

## 2019-09-13 DIAGNOSIS — Z113 Encounter for screening for infections with a predominantly sexual mode of transmission: Secondary | ICD-10-CM

## 2019-09-13 DIAGNOSIS — D219 Benign neoplasm of connective and other soft tissue, unspecified: Secondary | ICD-10-CM

## 2019-09-13 DIAGNOSIS — Z1272 Encounter for screening for malignant neoplasm of vagina: Secondary | ICD-10-CM

## 2019-09-13 DIAGNOSIS — D259 Leiomyoma of uterus, unspecified: Secondary | ICD-10-CM

## 2019-09-13 NOTE — Patient Instructions (Signed)
Endometrial Biopsy, Care After This sheet gives you information about how to care for yourself after your procedure. Your health care provider may also give you more specific instructions. If you have problems or questions, contact your health care provider. What can I expect after the procedure? After the procedure, it is common to have:  Mild cramping.  A small amount of vaginal bleeding for a few days. This is normal. Follow these instructions at home:   Take over-the-counter and prescription medicines only as told by your health care provider.  Do not douche, use tampons, or have sexual intercourse until your health care provider approves.  Return to your normal activities as told by your health care provider. Ask your health care provider what activities are safe for you.  Follow instructions from your health care provider about any activity restrictions, such as restrictions on strenuous exercise or heavy lifting. Contact a health care provider if:  You have heavy bleeding, or bleed for longer than 2 days after the procedure.  You have bad smelling discharge from your vagina.  You have a fever or chills.  You have a burning sensation when urinating or you have difficulty urinating.  You have severe pain in your lower abdomen. Get help right away if:  You have severe cramps in your stomach or back.  You pass large blood clots.  Your bleeding increases.  You become weak or light-headed, or you pass out. Summary  After the procedure, it is common to have mild cramping and a small amount of vaginal bleeding for a few days.  Do not douche, use tampons, or have sexual intercourse until your health care provider approves.  Return to your normal activities as told by your health care provider. Ask your health care provider what activities are safe for you. This information is not intended to replace advice given to you by your health care provider. Make sure you discuss any  questions you have with your health care provider. Document Revised: 04/08/2017 Document Reviewed: 05/12/2016 Elsevier Patient Education  [employer] [place]. Uterine Fibroids  Uterine fibroids are lumps of tissue (tumors) in your womb (uterus). They are not cancer (are benign). Most women with this condition do not need treatment. Sometimes fibroids can affect your ability to have children (your fertility). If that happens, you may need surgery to take out the fibroids. Follow these instructions at home:  Take over-the-counter and prescription medicines only as told by your doctor. Your doctor may suggest NSAIDs (such as aspirin or ibuprofen) to help with pain.  Ask your doctor if you should: ? Take iron pills. ? Eat more foods that have iron in them, such as dark green, leafy vegetables.  If directed, apply heat to your back or belly to reduce pain. Use the heat source that your doctor recommends, such as a moist heat pack or a heating pad. ? Put a towel between your skin and the heat source. ? Leave the heat on for 20-30 minutes. ? Remove the heat if your skin turns bright red. This is especially important if you are unable to feel pain, heat, or cold. You may have a greater risk of getting burned.  Pay close attention to your period (menstrual) cycles. Tell your doctor about any changes, such as: ? A heavier blood flow than usual. ? Needing to use more pads or tampons than normal. ? A change in how many days your period lasts. ? A change in symptoms that come with your period, such  as cramps or back pain.  Keep all follow-up visits as told by your doctor. This is important. Your doctor may need to watch your fibroids over time for any changes. Contact a doctor if you:  Have pain that does not get better with medicine or heat, such as pain or cramps in: ? Your back. ? The area between your hip bones (pelvic area). ? Your belly.  Have new bleeding between your periods.  Have more  bleeding during or between your periods.  Feel very tired or weak.  Feel light-headed. Get help right away if you:  Pass out (faint).  Have pain in the area between your hip bones that suddenly gets worse.  Have bleeding that soaks a tampon or pad in 30 minutes or less. Summary  Uterine fibroids are lumps of tissue (tumors) in your womb (uterus). They are not cancer.  The only treatment that most women need is taking aspirin or ibuprofen for pain.  Contact a doctor if you have pain or cramps that do not get better with medicine.  Make sure you know what symptoms you should get help for right away. This information is not intended to replace advice given to you by your health care provider. Make sure you discuss any questions you have with your health care provider. Document Revised: 04/08/2017 Document Reviewed: 03/22/2017 Elsevier Patient Education  2020 [place]. Menorrhagia Menorrhagia is when your menstrual periods are heavy or last longer than normal. Follow these instructions at home: Medicines   Take over-the-counter and prescription medicines exactly as told by your doctor. This includes iron pills.  Do not change or switch medicines without asking your doctor.  Do not take aspirin or medicines that contain aspirin 1 week before or during your period. Aspirin may make bleeding worse. General instructions  If you need to change your pad or tampon more than once every 2 hours, limit your activity until the bleeding stops.  Iron pills can cause problems when pooping (constipation). To prevent or treat pooping problems while taking prescription iron pills, your doctor may suggest that you: ? Drink enough fluid to keep your pee (urine) clear or pale yellow. ? Take over-the-counter or prescription medicines. ? Eat foods that are high in fiber. These foods include:  Fresh fruits and vegetables.  Whole grains.  Beans. ? Limit foods that are high in fat and  processed sugars. This includes fried and sweet foods.  Eat healthy meals and foods that are high in iron. Foods that have a lot of iron include: ? Leafy green vegetables. ? Meat. ? Liver. ? Eggs. ? Whole grain breads and cereals.  Do not try to lose weight until your heavy bleeding has stopped and you have normal amounts of iron in your blood. If you need to lose weight, work with your doctor.  Keep all follow-up visits as told by your doctor. This is important. Contact a doctor if:  You soak through a pad or tampon every 1 or 2 hours, and this happens every time you have a period.  You need to use pads and tampons at the same time because you are bleeding so much.  You are taking medicine and you: ? Feel sick to your stomach (nauseous). ? Throw up (vomit). ? Have watery poop (diarrhea).  You have other problems that may be related to the medicine you are taking. Get help right away if:  You soak through more than a pad or tampon in 1 hour.  You  pass clots bigger than 1 inch (2.5 cm) wide.  You feel short of breath.  You feel like your heart is beating too fast.  You feel dizzy or you pass out (faint).  You feel very weak or tired. Summary  Menorrhagia is when your menstrual periods are heavy or last longer than normal.  Take over-the-counter and prescription medicines exactly as told by your doctor. This includes iron pills.  Contact a doctor if you soak through more than a pad or tampon in 1 hour or are passing large clots. This information is not intended to replace advice given to you by your health care provider. Make sure you discuss any questions you have with your health care provider. Document Revised: 08/03/2017 Document Reviewed: 05/17/2016 Elsevier Patient Education  [place].

## 2019-09-13 NOTE — Assessment & Plan Note (Signed)
Repeat pap today

## 2019-09-13 NOTE — Progress Notes (Addendum)
Subjective:    Patient ID: Teresa Hudson is a 44 y.o. female presenting with Follow-up  on 09/13/2019  HPI: Recent hospitalization with blood transfusion due to menorrhagia. Hgb 4.6. Has had heavy cycles x 9 years. Cycles are monthly and last 5-7 days. Recent increasing cramping and more blood clots. Has no been in care for some time. H/o SVD x 3. Has h/o fibroids.  Review of Systems  Constitutional: Negative for chills and fever.  Respiratory: Negative for shortness of breath.   Cardiovascular: Negative for chest pain.  Gastrointestinal: Negative for abdominal pain, nausea and vomiting.  Genitourinary: Negative for dysuria.  Skin: Negative for rash.      Objective:    BP (!) 128/91   Pulse 96   Wt 172 lb 4.8 oz (78.2 kg)   LMP 08/20/2019 (Exact Date)   BMI 28.67 kg/m  Physical Exam Constitutional:      General: She is not in acute distress.    Appearance: She is well-developed.  HENT:     Head: Normocephalic and atraumatic.  Eyes:     General: No scleral icterus. Cardiovascular:     Rate and Rhythm: Normal rate.  Pulmonary:     Effort: Pulmonary effort is normal.  Abdominal:     Palpations: Abdomen is soft.  Genitourinary:    Comments: BUS normal, vagina is pink and rugated, cervix is parous without lesion, uterus is enlarged, firm, nodular about 14-16 wk size, no adnexal mass or tenderness.  Musculoskeletal:     Cervical back: Neck supple.  Skin:    General: Skin is warm and dry.  Neurological:     Mental Status: She is alert and oriented to person, place, and time.    Procedure: Patient given informed consent, signed copy in the chart, time out was performed. Appropriate time out taken. . The patient was placed in the lithotomy position and the cervix brought into view with sterile speculum.  Portio of cervix cleansed x 2 with betadine swabs.  A tenaculum was placed in the anterior lip of the cervix.  The uterus was sounded for depth of 11 cm. A pipelle was  introduced to into the uterus, suction created,  and an endometrial sample was obtained. All equipment was removed and accounted for.  The patient tolerated the procedure well.       Assessment & Plan:   Problem List Items Addressed This Visit      Unprioritized   LGSIL on Pap smear of cervix    Repeat pap today      Symptomatic anemia    On iron which is constipating her--repeat CBC      Menorrhagia - Primary    Likely related to fibroids, check u/s, labs, EMB today. May use megace with cycles. Discussed IUD, HTA with BTL and hysterectomy as alternatives. She is not interested in further fertility. Ongoing bleeding with need for transfusion discussed at length.      Relevant Orders   CBC   TSH   US PELVIC COMPLETE WITH TRANSVAGINAL   Surgical pathology( [place]/ [facility])   Fibroids    Other Visit Diagnoses    Screening for cervical cancer       Relevant Orders   Cytology - PAP( [facility])   Screen for STD (sexually transmitted disease)       Relevant Orders   Hepatitis B surface antigen   Hepatitis C antibody   HIV Antibody (routine testing w rflx)   RPR  Total time: 32 minutes.  Return in about 4 weeks (around 10/11/2019) for in person, a follow-up.  [name] 09/13/2019 1:41 PM

## 2019-09-13 NOTE — Assessment & Plan Note (Signed)
On iron which is constipating her--repeat CBC

## 2019-09-13 NOTE — Assessment & Plan Note (Signed)
Likely related to fibroids, check u/s, labs, EMB today. May use megace with cycles. Discussed IUD, HTA with BTL and hysterectomy as alternatives. She is not interested in further fertility. Ongoing bleeding with need for transfusion discussed at length.

## 2019-09-14 LAB — HIV ANTIBODY (ROUTINE TESTING W REFLEX): HIV Screen 4th Generation wRfx: NONREACTIVE

## 2019-09-14 LAB — CBC
Hematocrit: 34 % (ref 34.0–46.6)
Hemoglobin: 9.2 g/dL — ABNORMAL LOW (ref 11.1–15.9)
MCH: 19 pg — ABNORMAL LOW (ref 26.6–33.0)
MCHC: 27.1 g/dL — ABNORMAL LOW (ref 31.5–35.7)
MCV: 70 fL — ABNORMAL LOW (ref 79–97)
Platelets: 454 10*3/uL — ABNORMAL HIGH (ref 150–450)
RBC: 4.83 x10E6/uL (ref 3.77–5.28)
RDW: 26.9 % — ABNORMAL HIGH (ref 11.7–15.4)
WBC: 7.4 10*3/uL (ref 3.4–10.8)

## 2019-09-14 LAB — RPR: RPR Ser Ql: NONREACTIVE

## 2019-09-14 LAB — HEPATITIS B SURFACE ANTIGEN: Hepatitis B Surface Ag: NEGATIVE

## 2019-09-14 LAB — TSH: TSH: 2.83 u[IU]/mL (ref 0.450–4.500)

## 2019-09-14 LAB — HEPATITIS C ANTIBODY: Hep C Virus Ab: <0.1 s/co ratio (ref 0.0–0.9)

## 2019-09-17 LAB — SURGICAL PATHOLOGY

## 2019-09-18 LAB — CYTOLOGY - PAP
Chlamydia: NEGATIVE
Comment: NEGATIVE
Comment: NEGATIVE
Comment: NORMAL
Diagnosis: NEGATIVE
High risk HPV: NEGATIVE
Neisseria Gonorrhea: NEGATIVE

## 2019-10-15 ENCOUNTER — Other Ambulatory Visit: Payer: Self-pay

## 2019-10-15 ENCOUNTER — Ambulatory Visit
Admission: RE | Admit: 2019-10-15 | Discharge: 2019-10-15 | Disposition: A | Discharge status: Home / Self Care | Payer: Self-pay | Source: Ambulatory Visit | Attending: Family Medicine | Admitting: Family Medicine

## 2019-10-15 DIAGNOSIS — N920 Excessive and frequent menstruation with regular cycle: Secondary | ICD-10-CM

## 2019-10-22 ENCOUNTER — Other Ambulatory Visit: Payer: Self-pay

## 2019-10-22 ENCOUNTER — Telehealth (INDEPENDENT_AMBULATORY_CARE_PROVIDER_SITE_OTHER): Payer: Self-pay | Admitting: Lactation Services

## 2019-10-22 DIAGNOSIS — D219 Benign neoplasm of connective and other soft tissue, unspecified: Secondary | ICD-10-CM

## 2019-10-22 MED ORDER — MEGESTROL ACETATE 40 MG PO TABS: 40.0000 mg | ORAL_TABLET | Freq: Three times a day (TID) | ORAL | 0 refills | Status: DC

## 2019-10-22 NOTE — Telephone Encounter (Signed)
Patient called and left message voicing concerns that the Megace is no longer working and would like to know if there is anything else she can take to stop her bleeding.

## 2019-10-22 NOTE — Addendum Note (Signed)
Addended by: Donn Pierini on: 10/22/2019 03:27 PM   Modules accepted: Orders

## 2019-10-22 NOTE — Telephone Encounter (Addendum)
Called patient in response to her concerns that Megace is not working.   She is taking Megace 2 times a day and started when she started her cycle May 14. She reports she had more spotting with her cycle but over time the bleeding has increased. .   She has been bleeding very heavy in the last 4-5 days. She is taking the medication and it is not helping. She reports she has been changing her pads every hour to 2 hours and they are pretty heavy. When she woke up this morning she had bled through her clothing and in the bed, this was for about 6-7 hours. She is having gushing and passing clots.   Patient reports she has had AUB in the past.   Reviewed with patient that if she is soaking a pad an hour for more than 2 hours, it is recommended that she go to there ED for evaluation.   Reviewed with Dr. Rip Harbour. Patient to take Megace TID x 1 week and then decrease to BID. Patient voiced understanding.

## 2019-10-25 ENCOUNTER — Other Ambulatory Visit: Payer: Self-pay

## 2019-10-25 DIAGNOSIS — N6320 Unspecified lump in the left breast, unspecified quadrant: Secondary | ICD-10-CM

## 2019-11-06 ENCOUNTER — Other Ambulatory Visit: Payer: Self-pay

## 2019-11-06 ENCOUNTER — Ambulatory Visit: Payer: Self-pay | Admitting: *Deleted

## 2019-11-06 VITALS — BP 144/83 | Temp 98.2°F | Wt 182.1 lb

## 2019-11-06 DIAGNOSIS — Z1239 Encounter for other screening for malignant neoplasm of breast: Secondary | ICD-10-CM

## 2019-11-06 NOTE — Progress Notes (Signed)
[name] is a 44 y.o. female who presents to [facility] clinic today with no complaints.    Pap Smear: Pap not smear completed today. Last Pap smear was 09/13/2019 at [facility] clinic and was normal with negative HPV. Per patient has history of an abnormal Pap smear from 2013. A colposcopy was done to follow up for the abnormal Pap smear and LSIL was found at the time. Patient has had 3 normal Pap smears since then. Last Pap smear result is available in Epic.   Physical exam: Breasts Breasts symmetrical. No skin abnormalities bilateral breasts. No nipple retraction bilateral breasts. No nipple discharge bilateral breasts. No lymphadenopathy. No lumps palpated bilateral breasts. 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 A follow up ultrasound was recommended in one year but patient states she did not make a follow up appointment.       Pelvic/Bimanual Pap is not indicated today.    Smoking History: Patient has never smoked.   Patient Navigation: Patient education provided. Access to services provided for patient through [facility] program.    Breast and Cervical Cancer Risk Assessment: Patient does family history of breast cancer in her mother. No known genetic mutations, or radiation treatment to the chest before age 73. 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 She is not immunocompromised and did not have DES exposure in-utero.  Risk Assessment    Risk Scores      11/06/2019   Last edited by: Demetrius Revel, LPN   5-year risk: 1 %   Lifetime risk: 14.9 %         A: [facility] exam without pap smear No complaints today.  P: Referred patient to the [facility] for a diagnostic mammogram. Appointment scheduled November 07, 2019 at 10:30am.  [name], RN, FNP student 11/06/2019 3:40 PM   Attestation of Supervision of Student:  I confirm that I have verified the information  documented in the nurse practitioner student's note and that I have also personally reperformed the history, physical exam and all medical decision making activities.  I have verified that all services and findings are accurately documented in this student's note; and I agree with management and plan as outlined in the documentation. I have also made any necessary editorial changes.  Patient has a history of an abnormal Pap smear 05/06/2011 that was LSIL and a follow colposcopy was completed 01/24/2012 that was normal. Last Pap smear and colposcopy results are in EPIC.  [place], [place] for [employer], [place] 11/06/2019 4:45 PM

## 2019-11-06 NOTE — Patient Instructions (Addendum)
Informed Teresa Hudson about breast self awareness. Patient did not need a Pap smear today due to last Pap smear and HPV typing was on 09/13/2019. Let her know [facility] will cover Pap smears and HPV typing every 5 years unless has a history of abnormal Pap smears. Referred patient to the [facility] for diagnostic mammogram. Appointment scheduled for November 07, 2019 at 10:30am. Patient aware of appointment and will be there. Teresa Hudson verbalized understanding.  [name], RN, FNP student 4:14 PM

## 2019-11-07 ENCOUNTER — Ambulatory Visit
Admission: RE | Admit: 2019-11-07 | Discharge: 2019-11-07 | Disposition: A | Discharge status: Home / Self Care | Payer: No Typology Code available for payment source | Source: Ambulatory Visit | Attending: Obstetrics and Gynecology | Admitting: Obstetrics and Gynecology

## 2019-11-07 ENCOUNTER — Other Ambulatory Visit: Payer: Self-pay | Admitting: Obstetrics and Gynecology

## 2019-11-07 ENCOUNTER — Ambulatory Visit: Payer: Self-pay

## 2019-11-07 ENCOUNTER — Ambulatory Visit (INDEPENDENT_AMBULATORY_CARE_PROVIDER_SITE_OTHER): Payer: Self-pay | Admitting: Family Medicine

## 2019-11-07 DIAGNOSIS — Z1231 Encounter for screening mammogram for malignant neoplasm of breast: Secondary | ICD-10-CM

## 2019-11-07 DIAGNOSIS — N920 Excessive and frequent menstruation with regular cycle: Secondary | ICD-10-CM

## 2019-11-07 DIAGNOSIS — D219 Benign neoplasm of connective and other soft tissue, unspecified: Secondary | ICD-10-CM

## 2019-11-07 DIAGNOSIS — N6320 Unspecified lump in the left breast, unspecified quadrant: Secondary | ICD-10-CM

## 2019-11-08 ENCOUNTER — Encounter: Payer: Self-pay | Admitting: Family Medicine

## 2019-11-08 NOTE — Progress Notes (Signed)
° °  Subjective:    Patient ID: Teresa Hudson is a 44 y.o. female presenting with No chief complaint on file.  on 11/07/2019  HPI: Here to f/u from her u/s. Findings revealed 12 wk size uterus with multiple fibroids. At her last visit, we discussed options. She is not wanting full hysterectomy. She is on Megace, and bleeding is lighter, but not stopped. She has gained 10 lbs.  Review of Systems  Constitutional: Negative for chills and fever.  Respiratory: Negative for shortness of breath.   Cardiovascular: Negative for chest pain.  Gastrointestinal: Negative for abdominal pain, nausea and vomiting.  Genitourinary: Positive for vaginal bleeding. Negative for dysuria.  Skin: Negative for rash.      Objective:    There were no vitals taken for this visit. Physical Exam Constitutional:      General: She is not in acute distress.    Appearance: She is well-developed.  HENT:     Head: Normocephalic and atraumatic.  Eyes:     General: No scleral icterus. Cardiovascular:     Rate and Rhythm: Normal rate.  Pulmonary:     Effort: Pulmonary effort is normal.  Abdominal:     Palpations: Abdomen is soft.  Musculoskeletal:     Cervical back: Neck supple.  Skin:    General: Skin is warm and dry.  Neurological:     Mental Status: She is alert and oriented to person, place, and time.         Assessment & Plan:   Problem List Items Addressed This Visit      Unprioritized   Menorrhagia    Admitted for transfusion with hgb in the 4 range previously--will attempt IUD for bleeding control. Last hgb up to 9.2.      Fibroids - Primary    Advised that this may not help and will start with IUD. Alternative of BTL with HTA also possibility and all are short of hysterectomy, which she does not want. She has completed childbearing.         Total time in review of prior notes, pathology, labs, history taking, review with patient, exam, note writing, discussion of options, plan for next  steps, alternatives and risks of treatment: 20 minutes.  Return in about 4 weeks (around 12/05/2019) for IUD insertion.  [name] 11/08/2019 8:19 AM

## 2019-11-08 NOTE — Assessment & Plan Note (Signed)
Advised that this may not help and will start with IUD. Alternative of BTL with HTA also possibility and all are short of hysterectomy, which she does not want. She has completed childbearing.

## 2019-11-08 NOTE — Assessment & Plan Note (Addendum)
Admitted for transfusion with hgb in the 4 range previously--will attempt IUD for bleeding control. Last hgb up to 9.2.

## 2019-11-29 ENCOUNTER — Telehealth: Payer: Self-pay

## 2019-11-29 NOTE — Telephone Encounter (Signed)
Pt called stating that she was prescribed Megace which is causing her to cramp and bleed what type of pain medication can she can take.  Called pt and advised pt that she can take Tylenol 1000 mg or Ibuprofen (4) 200 tablets to total 800 mg every 8 hours.  Pt verbalized understanding.   Mel Almond, RN

## 2019-11-30 ENCOUNTER — Ambulatory Visit
Admission: EM | Admit: 2019-11-30 | Discharge: 2019-11-30 | Disposition: A | Discharge status: Home / Self Care | Payer: No Typology Code available for payment source | Attending: Physician Assistant | Admitting: Physician Assistant

## 2019-11-30 ENCOUNTER — Telehealth: Payer: Self-pay | Admitting: Physician Assistant

## 2019-11-30 ENCOUNTER — Telehealth: Payer: Self-pay | Admitting: General Practice

## 2019-11-30 ENCOUNTER — Telehealth: Payer: Self-pay

## 2019-11-30 ENCOUNTER — Other Ambulatory Visit: Payer: Self-pay

## 2019-11-30 DIAGNOSIS — N921 Excessive and frequent menstruation with irregular cycle: Secondary | ICD-10-CM

## 2019-11-30 DIAGNOSIS — R002 Palpitations: Secondary | ICD-10-CM

## 2019-11-30 DIAGNOSIS — N946 Dysmenorrhea, unspecified: Secondary | ICD-10-CM

## 2019-11-30 LAB — CBC WITH DIFFERENTIAL/PLATELET
Basophils Absolute: 0 10*3/uL (ref 0.0–0.2)
Basos: 0 %
EOS (ABSOLUTE): 0 10*3/uL (ref 0.0–0.4)
Eos: 0 %
Hematocrit: 30 % — ABNORMAL LOW (ref 34.0–46.6)
Hemoglobin: 9.4 g/dL — ABNORMAL LOW (ref 11.1–15.9)
Lymphocytes Absolute: 2.2 10*3/uL (ref 0.7–3.1)
Lymphs: 23 %
MCH: 21.1 pg — ABNORMAL LOW (ref 26.6–33.0)
MCHC: 31.3 g/dL — ABNORMAL LOW (ref 31.5–35.7)
MCV: 67 fL — ABNORMAL LOW (ref 79–97)
Monocytes Absolute: 1.2 10*3/uL — ABNORMAL HIGH (ref 0.1–0.9)
Monocytes: 13 %
Neutrophils Absolute: 6 10*3/uL (ref 1.4–7.0)
Neutrophils: 64 %
Platelets: 419 10*3/uL (ref 150–450)
RBC: 4.46 x10E6/uL (ref 3.77–5.28)
RDW: 18.2 % — ABNORMAL HIGH (ref 11.7–15.4)
WBC: 9.4 10*3/uL (ref 3.4–10.8)

## 2019-11-30 MED ORDER — MEGESTROL ACETATE 40 MG PO TABS: 80.0000 mg | ORAL_TABLET | Freq: Two times a day (BID) | ORAL | 0 refills | Status: DC

## 2019-11-30 NOTE — ED Triage Notes (Signed)
Pt c/o severe lower abdominal cramping with heavy vaginal bleeding since Monday. States going through 1 pad an hour. States called her OBGYN yesterday morning and was told to take ibuprofen. States woke up this morning feeling like her heart is pounding. States has an appt on 8/12 for an IUD placement.

## 2019-11-30 NOTE — Telephone Encounter (Signed)
Received message on nurse voicemail line from [place] who was currently seeing the patient at [facility]. She was hoping for some suggestions from Dr Kennon Rounds regarding this patient's care. Dr Kennon Rounds isn't in office today and isn't back until Monday 7/26. Discussed patient with Dr Harolyn Rutherford who states patient should try Megace 80mg  BID to reduce bleeding until her appt with Dr Kennon Rounds on 8/12.   Called Amy back and discussed recommendations. She verbalized understanding and asked what we do for pain relief from cramping. Discussed our usual recommendation is Ibuprofen 800mg  or Naproxen Sodium. She verbalized understanding. Discussed with her our hope/expectation is bleeding will stop OR decrease to spotting. Asked she tell patient to contact us sooner if bleeding doesn't improve. She verbalized understanding.

## 2019-11-30 NOTE — ED Provider Notes (Signed)
[name]    CSN: [id number] Arrival date & time: 11/30/19  7622      History   Chief Complaint Chief Complaint  Patient presents with   Abdominal Pain    HPI Teresa Hudson is a 44 y.o. female.   44 year old female with history of fibroids, menorrhagia with history of needing transfusions come in for 5 day history of abdominal cramping, increase in vaginal bleeding. She is on Megace 40mg  BID at baseline, usually still with spotting daily. However, states since 5 days ago, has been bleeding through 1 pad/hr. Has had cramping with blood clots. Now experiencing palpitations. Denies dizziness, lightheadedness. States has appointment 8/12 to discuss IUD placement for further treatment.      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 Authorizing Provider  docusate sodium (COLACE) 100 MG capsule Take 1 capsule (100 mg total) by mouth 2 (two) times daily. 08/27/19   Mariel Aloe, MD  ferrous gluconate (FERGON) 324 MG tablet Take 1 tablet (324 mg total)  by mouth 2 (two) times daily with a meal for 14 days, THEN 1 tablet (324 mg total) daily with breakfast. 08/27/19 10/10/19  Mariel Aloe, MD  megestrol (MEGACE) 40 MG tablet Take 2 tablets (80 mg total) by mouth 2 (two) times daily. 11/30/19   Tasia Catchings, Stephaney Steven V, PA-C  Multiple Vitamin (MULTIVITAMIN) tablet Take 1 tablet by mouth daily.    [provider]  hydrochlorothiazide (HYDRODIURIL) 12.5 MG tablet Take 1 tablet (12.5 mg total) by mouth daily. Patient not taking: Reported on 11/30/2017 06/25/16 08/26/19  Boykin Nearing, MD  potassium chloride SA (K-DUR,KLOR-CON) 20 MEQ tablet Take 1 tablet (20 mEq total) by mouth 2 (two) times daily. Patient not taking: Reported on 11/30/2017 03/03/13 08/26/19  Randal Buba, April, MD    Family History Family History  Problem Relation Age of Onset   Diabetes Mother    Breast cancer Mother 71   Cancer Father        prostate   Hypertension Father    Asthma Sister    GI problems Sister    Thyroid cancer Sister     Social History Social History   Tobacco Use   Smoking status: Never Smoker  Smokeless tobacco: Never Used  Vaping Use   Vaping Use: Never used  Substance Use Topics   Alcohol use: Yes    Comment: ocassionally    Drug use: No     Allergies   Sulfa antibiotics, Aspirin, Latex, Penicillins, and Nickel   Review of Systems Review of Systems  Reason unable to perform ROS: See HPI as above.     Physical Exam Triage Vital Signs ED Triage Vitals  Enc Vitals Group     BP 11/30/19 0821 (!) 138/96     Pulse Rate 11/30/19 0821 (!) 120     Resp 11/30/19 0821 20     Temp 11/30/19 0821 98.7 F (37.1 C)     Temp Source 11/30/19 0821 Oral     SpO2 11/30/19 0821 100 %     Weight --      Height --      Head Circumference --      Peak Flow --      Pain Score 11/30/19 0826 0     Pain Loc --      Pain Edu? --      Excl. in [place]? --    No data found.  Updated Vital Signs BP (!) 138/96 (BP Location: Left Arm)    Pulse (!)  113    Temp 98.7 F (37.1 C) (Oral)    Resp 20    SpO2 100%   Physical Exam Constitutional:      General: She is not in acute distress.    Appearance: Normal appearance. She is well-developed. She is not toxic-appearing or diaphoretic.  HENT:     Head: Normocephalic and atraumatic.  Eyes:     Conjunctiva/sclera: Conjunctivae normal.     Pupils: Pupils are equal, round, and reactive to light.  Cardiovascular:     Rate and Rhythm: Regular rhythm. Tachycardia present.  Pulmonary:     Effort: Pulmonary effort is normal. No respiratory distress.     Comments: LCTAB Abdominal:     General: Bowel sounds are normal.     Palpations: Abdomen is soft.     Comments: Tender to palpation of suprapubic region, with palpable hard uterus.   Musculoskeletal:     Cervical back: Normal range of motion and neck supple.  Skin:    General: Skin is warm and dry.  Neurological:     Mental Status: She is alert and oriented to person, place, and time.     UC Treatments / Results  Labs (all labs ordered are listed, but only abnormal results are displayed) Labs Reviewed  CBC WITH DIFFERENTIAL/PLATELET - Abnormal; Notable for the following components:      Result Value   Hemoglobin 9.4 (*)    Hematocrit 30.0 (*)    MCV 67 (*)    MCH 21.1 (*)    MCHC 31.3 (*)    RDW 18.2 (*)    Monocytes Absolute 1.2 (*)    All other components within normal limits   Narrative:    Performed at:  [facility] [place], [place], [place]  [id number] Lab Director: Lindon Romp MD, Phone:  [phone]    EKG   Radiology No results found.  Procedures Procedures (including critical care time)  Medications Ordered in UC Medications - No data to display  Initial Impression / Assessment and Plan / UC Course  I have reviewed the triage vital signs and the nursing notes.  Pertinent labs & imaging results that  were available during my care of the patient were reviewed by me and  considered in my medical decision making (see chart for details).    Patient tachycardic at triage, ranging 110s to 120s.  During exam, brief periods of heart rate down to 90s.  Patient able to ambulate on own without lightheadedness, dizziness.  Mucosal membrane not particularly pale.  Will draw CBC to check for hemoglobin given patient has history of requiring transfusions.  Called patient's GYN office, left message to discuss plan.  Discussed increasing ibuprofen/Tylenol to help with dysmenorrhea.  Patient discharged in stable condition pending lab results.  Return precautions given.  Patient expresses understanding and agrees to plan.  Hemoglobin 9.4.  Received GYN phone call, recommending increasing Megace to 80 twice daily.  Patient to call GYN office if symptoms not improving next week for close follow-up, otherwise to follow-up with GYN as scheduled on 12/20/2019. RN called patient to update current plan, please see RN notes. Megace refilled.   Final Clinical Impressions(s) / UC Diagnoses   Final diagnoses:  Menorrhagia with irregular cycle  Palpitations  Dysmenorrhea   ED Prescriptions    None     PDMP not reviewed this encounter.   Ok Edwards, PA-C 11/30/19 1201

## 2019-11-30 NOTE — Telephone Encounter (Signed)
Please see visit note 11/30/2019

## 2019-11-30 NOTE — Discharge Instructions (Addendum)
Increase ibuprofen to 800mg  three times a day and take tylenol 1000mg  three times a day as well. Keep hydrated, urine should be clear to pale yellow in color. We will give you a call in regards to lab results, and treatment plan. If feeling dizziness, lightheadedness, passing out, go to the ED for further evaluation.

## 2019-12-09 DIAGNOSIS — U071 COVID-19: Secondary | ICD-10-CM

## 2019-12-09 DIAGNOSIS — J189 Pneumonia, unspecified organism: Secondary | ICD-10-CM

## 2019-12-09 HISTORY — DX: COVID-19: U07.1

## 2019-12-09 HISTORY — DX: Pneumonia, unspecified organism: J18.9

## 2019-12-20 ENCOUNTER — Other Ambulatory Visit: Payer: Self-pay

## 2019-12-20 ENCOUNTER — Encounter: Payer: Self-pay | Admitting: Family Medicine

## 2019-12-20 ENCOUNTER — Ambulatory Visit (INDEPENDENT_AMBULATORY_CARE_PROVIDER_SITE_OTHER): Payer: Self-pay | Admitting: Family Medicine

## 2019-12-20 VITALS — BP 144/103 | HR 123 | Wt 187.0 lb

## 2019-12-20 DIAGNOSIS — D219 Benign neoplasm of connective and other soft tissue, unspecified: Secondary | ICD-10-CM

## 2019-12-20 DIAGNOSIS — Z3202 Encounter for pregnancy test, result negative: Secondary | ICD-10-CM

## 2019-12-20 LAB — POCT PREGNANCY, URINE: Preg Test, Ur: NEGATIVE

## 2019-12-20 MED ORDER — LEVONORGESTREL 19.5 MCG/DAY IU IUD
INTRAUTERINE_SYSTEM | Freq: Once | INTRAUTERINE | Status: AC
  Administered 2019-12-20: 1 via INTRAUTERINE

## 2019-12-20 MED ORDER — IBUPROFEN 800 MG PO TABS
800.0000 mg | ORAL_TABLET | Freq: Once | ORAL | Status: AC
  Administered 2019-12-20: 800 mg via ORAL

## 2019-12-20 NOTE — Progress Notes (Signed)
   Subjective:    Patient ID: Teresa Hudson is a 44 y.o. female presenting with Menorrhagia  on 12/20/2019  HPI: Here today for IUD insertion. Has h/o menorrhagia, with hgb to 4, and admission and transmission. U/S shows submucosal fibroid. On megace and bleeding with lots of clots. Has gained 15 pounds.  Review of Systems  Constitutional: Negative for chills and fever.  Respiratory: Negative for shortness of breath.   Cardiovascular: Negative for chest pain.  Gastrointestinal: Negative for abdominal pain, nausea and vomiting.  Genitourinary: Positive for vaginal bleeding. Negative for dysuria.  Skin: Negative for rash.      Objective:    BP (!) 144/103   Pulse (!) 123   Wt 187 lb (84.8 kg)   LMP 09/19/2019   BMI 31.12 kg/m  Physical Exam Constitutional:      General: She is not in acute distress.    Appearance: She is well-developed.  HENT:     Head: Normocephalic and atraumatic.  Eyes:     General: No scleral icterus. Cardiovascular:     Rate and Rhythm: Normal rate.  Pulmonary:     Effort: Pulmonary effort is normal.  Abdominal:     Palpations: Abdomen is soft.  Genitourinary:    Comments: BUS normal, vagina is pink and rugated, cervix is parous without lesion  Musculoskeletal:     Cervical back: Neck supple.  Skin:    General: Skin is warm and dry.  Neurological:     Mental Status: She is alert and oriented to person, place, and time.    Procedure: Patient identified, informed consent performed, signed copy in chart, time out was performed.  Urine pregnancy test negative.  Speculum placed in the vagina.  Cervix visualized.  Cleaned with Betadine x 2.  Grasped anteriourly with a single tooth tenaculum.  Uterus sounded to 10 cm.  Mirena IUD placed per manufacturer's recommendations.  Strings trimmed to 3 cm.   Patient given post procedure instructions and Mirena care card with expiration date.        Assessment & Plan:   Problem List Items Addressed  This Visit      Unprioritized   Fibroids - Primary    S/p IUD insertion today. Continue megace for a few days and then stop.  Patient is asked to check IUD strings periodically and follow up in 4-6 weeks for IUD check. F/u in 3 months and 6 to see how bleeding is doing. If still not working can consider alternative.       Other Visit Diagnoses    Negative pregnancy test          Total time in review of prior notes, pathology, labs, history taking, review with patient, exam, note writing, discussion of options, plan for next steps, alternatives and risks of treatment: 20 minutes.  Return in about 4 weeks (around 01/17/2020) for in person, iud check.  Teresa Hudson 12/20/2019 3:32 PM

## 2019-12-20 NOTE — Patient Instructions (Signed)

## 2019-12-20 NOTE — Assessment & Plan Note (Signed)
S/p IUD insertion today. Continue megace for a few days and then stop.  Patient is asked to check IUD strings periodically and follow up in 4-6 weeks for IUD check. F/u in 3 months and 6 to see how bleeding is doing. If still not working can consider alternative.

## 2019-12-20 NOTE — Addendum Note (Signed)
Addended by: Orlin Hilding on: 12/20/2019 04:08 PM   Modules accepted: Orders

## 2019-12-24 ENCOUNTER — Encounter: Payer: Self-pay | Admitting: Nurse Practitioner

## 2019-12-24 ENCOUNTER — Ambulatory Visit: Payer: Self-pay | Attending: Nurse Practitioner | Admitting: Nurse Practitioner

## 2019-12-24 DIAGNOSIS — I10 Essential (primary) hypertension: Secondary | ICD-10-CM

## 2019-12-24 DIAGNOSIS — Z7689 Persons encountering health services in other specified circumstances: Secondary | ICD-10-CM

## 2019-12-24 MED ORDER — AMLODIPINE BESYLATE 5 MG PO TABS: 5.0000 mg | ORAL_TABLET | Freq: Every day | ORAL | 1 refills | Status: DC

## 2019-12-24 NOTE — Telephone Encounter (Signed)
Patient is calling to see if her appt for today could be virtual? Patient is needing to discuss her blood pressure.  However, her daughter tested positive for COVID. Please advise [phone]

## 2019-12-24 NOTE — Telephone Encounter (Signed)
Patients request was granted. Patient was called and informed. Patient verbalized understanding.

## 2019-12-24 NOTE — Progress Notes (Signed)
Virtual Visit via Telephone Note Due to national recommendations of social distancing due to [place] 19, telehealth visit is felt to be most appropriate for this patient at this time.  I discussed the limitations, risks, security and privacy concerns of performing an evaluation and management service by telephone and the availability of in person appointments. I also discussed with the patient that there may be a patient responsible charge related to this service. The patient expressed understanding and agreed to proceed.    I connected with Teresa Hudson on 12/24/19  at   1:30 PM EDT  EDT by telephone and verified that I am speaking with the correct person using two identifiers.   Consent I discussed the limitations, risks, security and privacy concerns of performing an evaluation and management service by telephone and the availability of in person appointments. I also discussed with the patient that there may be a patient responsible charge related to this service. The patient expressed understanding and agreed to proceed.   Location of Patient: Private Residence   Location of Provider: [place] and [place] participating in Telemedicine visit: [name] FNP-BC [place]    History of Present Illness: Telemedicine visit for: Re Establish Care  Her daughter currently has COVID. She herself  has tested negative.   Past medical history of Anemia (1997), Chlamydia, Fibroids,  Hypertension (2012 dx), and LSIL (low grade squamous intraepithelial lesion) on Pap smear (01/24/2012).    Essential Hypertension No longer taking HCTZ. Will start on amlodipine as she has a history of abnormal calcium levels on HCTZ. Heart rate has also been elevated. May need to add a low dose beta blocker. Denies worsening chest pain, increased shortness of breath, palpitations. She does not have a blood pressure device at home. I have recommended she  purchase one to record her blood pressure readings and heart rate. She does have a history of anemia however current levels are not critical and I doubt would be the current cause of her tachycardia. She does take OTC iron pills for her anemia.  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   Colpo 01/24/12    Past Surgical History:  Procedure Laterality Date   wisdom tooth removal      Family History  Problem Relation Age of Onset   Diabetes Mother    Breast cancer Mother 72   Cancer Father        prostate   Hypertension Father    Asthma Sister    GI problems Sister    Thyroid cancer Sister     Social History   Socioeconomic History   Marital status: Single    Spouse name: Not on file   Number of children: 3   Years of education: Not on file   Highest education level: Some college, no degree  Occupational History   Not on file  Tobacco Use   Smoking status: Never Smoker   Smokeless tobacco: Never Used  Vaping Use   Vaping Use: Never used  Substance and Sexual Activity   Alcohol use: Yes    Comment: ocassionally    Drug use: No  Sexual activity: Not Currently    Birth control/protection: Condom  Other Topics Concern   Not on file  Social History Narrative   Not on file   Social Determinants of Health   Financial Resource Strain:    Difficulty of Paying Living Expenses:   Food Insecurity: No Food Insecurity   Worried About Running Out of Food in the Last Year: Never true   Ran Out of Food in the Last Year: Never true  Transportation Needs: No Transportation Needs   Lack of Transportation (Medical): No   Lack of Transportation (Non-Medical): No  Physical Activity:    Days of  Exercise per Week:    Minutes of Exercise per Session:   Stress:    Feeling of Stress :   Social Connections:    Frequency of Communication with Friends and Family:    Frequency of Social Gatherings with Friends and Family:    Attends Religious Services:    Active Member of Clubs or Organizations:    Attends Music therapist:    Marital Status:      Observations/Objective: Awake, alert and oriented x 3   Review of Systems  Constitutional: Negative for fever, malaise/fatigue and weight loss.  HENT: Negative.  Negative for nosebleeds.   Eyes: Negative.  Negative for blurred vision, double vision and photophobia.  Respiratory: Negative.  Negative for cough, shortness of breath and wheezing.   Cardiovascular: Negative.  Negative for chest pain, palpitations, leg swelling and PND.  Gastrointestinal: Negative.  Negative for heartburn, nausea and vomiting.  Musculoskeletal: Negative.  Negative for myalgias.  Neurological: Negative.  Negative for dizziness, focal weakness, seizures and headaches.  Psychiatric/Behavioral: Negative.  Negative for suicidal ideas.    Assessment and Plan: Teresa Hudson was seen today for medication refill.  Diagnoses and all orders for this visit:  Encounter to establish care  Essential hypertension -     amLODipine (NORVASC) 5 MG tablet; Take 1 tablet (5 mg total) by mouth daily. Continue all antihypertensives as prescribed.  Remember to bring in your blood pressure log with you for your follow up appointment.  DASH/Mediterranean Diets are healthier choices for HTN.      Follow Up Instructions Return in about 4 weeks (around 01/21/2020) for BP recheck.     I discussed the assessment and treatment plan with the patient. The patient was provided an opportunity to ask questions and all were answered. The patient agreed with the plan and demonstrated an understanding of the instructions.   The patient was advised to call back or seek an  in-person evaluation if the symptoms worsen or if the condition fails to improve as anticipated.  I provided 17 minutes of non-face-to-face time during this encounter including median intraservice time, reviewing previous notes, labs, imaging, medications and explaining diagnosis and management.  Gildardo Pounds, FNP-BC

## 2019-12-25 ENCOUNTER — Other Ambulatory Visit: Payer: Self-pay

## 2019-12-25 MED FILL — AMLODIPINE BESYLATE 5 MG TA: 5 | 30 days supply | Qty: 30 | Fill #0

## 2019-12-31 ENCOUNTER — Ambulatory Visit: Payer: Self-pay | Admitting: *Deleted

## 2019-12-31 NOTE — Telephone Encounter (Signed)
Patient took a home covid test and is wanting a call back from a nurse as to what to do because her COVID test was positive   Patient had exposure to her daughter who tested + COVID. Patient started symptoms 2 days ago and she did home test which was + COVID. Patient advised per protocols. She will call back if she feels she need something other than OTC medications. Reason for Disposition . [1] OTRRN-16 diagnosed by HCP (doctor, NP or PA) AND [2] mild symptoms (e.g., cough, fever, others) AND [5] no complications or SOB  Answer Assessment - Initial Assessment Questions 1. COVID-19 DIAGNOSIS: "Who made your Coronavirus (COVID-19) diagnosis?" "Was it confirmed by a positive lab test?" If not diagnosed by a HCP, ask "Are there lots of cases (community spread) where you live?" (See public health department website, if unsure)     OTC test 2. COVID-19 EXPOSURE: "Was there any known exposure to COVID before the symptoms began?" CDC Definition of close contact: within 6 feet (2 meters) for a total of 15 minutes or more over a 24-hour period.      Daughter was + COVID 3. ONSET: "When did the COVID-19 symptoms start?"      Symptoms started 2 days ago 4. WORST SYMPTOM: "What is your worst symptom?" (e.g., cough, fever, shortness of breath, muscle aches)     Sore throat 5. COUGH: "Do you have a cough?" If Yes, ask: "How bad is the cough?"       Yes- has not coughed today 6. FEVER: "Do you have a fever?" If Yes, ask: "What is your temperature, how was it measured, and when did it start?"     Chills- but has not chacked 7. RESPIRATORY STATUS: "Describe your breathing?" (e.g., shortness of breath, wheezing, unable to speak)      Winded with exertion 8. BETTER-SAME-WORSE: "Are you getting better, staying the same or getting worse compared to yesterday?"  If getting worse, ask, "In what way?"     Same- changing symptoms 9. HIGH RISK DISEASE: "Do you have any chronic medical problems?" (e.g., asthma, heart or  lung disease, weak immune system, obesity, etc.)     High BP/heart rate 10. PREGNANCY: "Is there any chance you are pregnant?" "When was your last menstrual period?"      IUD- no sexually active 11. OTHER SYMPTOMS: "Do you have any other symptoms?"  (e.g., chills, fatigue, headache, loss of smell or taste, muscle pain, sore throat; new loss of smell or taste especially support the diagnosis of COVID-19)       Muscle pain  Protocols used: CORONAVIRUS (COVID-19) DIAGNOSED OR SUSPECTED-A-AH

## 2020-01-02 ENCOUNTER — Encounter: Payer: Self-pay | Admitting: Nurse Practitioner

## 2020-01-02 NOTE — Telephone Encounter (Signed)
Will route to PCP for advice.

## 2020-01-03 ENCOUNTER — Other Ambulatory Visit: Payer: Self-pay | Admitting: Nurse Practitioner

## 2020-01-03 MED ORDER — ALBUTEROL SULFATE HFA 108 (90 BASE) MCG/ACT IN AERS: 2.0000 | INHALATION_SPRAY | Freq: Four times a day (QID) | RESPIRATORY_TRACT | 0 refills | Status: DC | PRN

## 2020-01-03 MED ORDER — GUAIFENESIN-DM 100-10 MG/5ML PO SYRP: 15.0000 mL | ORAL_SOLUTION | Freq: Three times a day (TID) | ORAL | 0 refills | Status: DC | PRN

## 2020-01-03 MED FILL — ALBUTEROL SULFATE HFA 108 (: 108 (90 BAS | 25 days supply | Qty: 18 | Fill #0

## 2020-01-03 NOTE — Telephone Encounter (Signed)
Spoke with patient. All questions answered.

## 2020-01-04 ENCOUNTER — Telehealth: Payer: Self-pay | Admitting: Nurse Practitioner

## 2020-01-04 NOTE — Telephone Encounter (Signed)
Copied from [place] [id number]. Topic: General - Other >> Jan 01, 2020  9:03 AM Teresa Hudson A wrote: Patient found out that she was positive for covid yesterday and wants to know if [name] can send in any medication for her cough. Patient stated that she wasnted to know if she could be prescribed a "cocktail". Patient doesn't know the exact name of medication but stated it is referred to that way. Please advise

## 2020-01-06 ENCOUNTER — Encounter: Payer: Self-pay | Admitting: Emergency Medicine

## 2020-01-06 ENCOUNTER — Ambulatory Visit (INDEPENDENT_AMBULATORY_CARE_PROVIDER_SITE_OTHER): Payer: Self-pay

## 2020-01-06 ENCOUNTER — Other Ambulatory Visit: Payer: Self-pay

## 2020-01-06 ENCOUNTER — Ambulatory Visit
Admission: EM | Admit: 2020-01-06 | Discharge: 2020-01-06 | Disposition: A | Discharge status: Home / Self Care | Payer: Self-pay | Attending: Emergency Medicine | Admitting: Emergency Medicine

## 2020-01-06 DIAGNOSIS — Z1152 Encounter for screening for COVID-19: Secondary | ICD-10-CM

## 2020-01-06 DIAGNOSIS — R05 Cough: Secondary | ICD-10-CM

## 2020-01-06 DIAGNOSIS — J189 Pneumonia, unspecified organism: Secondary | ICD-10-CM

## 2020-01-06 DIAGNOSIS — R509 Fever, unspecified: Secondary | ICD-10-CM

## 2020-01-06 DIAGNOSIS — M791 Myalgia, unspecified site: Secondary | ICD-10-CM

## 2020-01-06 DIAGNOSIS — U071 COVID-19: Secondary | ICD-10-CM

## 2020-01-06 DIAGNOSIS — R5383 Other fatigue: Secondary | ICD-10-CM

## 2020-01-06 DIAGNOSIS — R0602 Shortness of breath: Secondary | ICD-10-CM

## 2020-01-06 DIAGNOSIS — I10 Essential (primary) hypertension: Secondary | ICD-10-CM

## 2020-01-06 MED ORDER — CETIRIZINE HCL 10 MG PO TABS: 10.0000 mg | ORAL_TABLET | Freq: Every day | ORAL | 0 refills | Status: DC

## 2020-01-06 MED ORDER — ACETAMINOPHEN 325 MG PO TABS
650.0000 mg | ORAL_TABLET | Freq: Once | ORAL | Status: AC
  Administered 2020-01-06: 650 mg via ORAL

## 2020-01-06 MED ORDER — AEROCHAMBER PLUS FLO-VU MEDIUM MISC: 1.0000 | Freq: Once | 0 refills | Status: AC

## 2020-01-06 MED ORDER — ALBUTEROL SULFATE HFA 108 (90 BASE) MCG/ACT IN AERS: 2.0000 | INHALATION_SPRAY | RESPIRATORY_TRACT | 0 refills | Status: DC | PRN

## 2020-01-06 MED ORDER — BENZONATATE 100 MG PO CAPS: 100.0000 mg | ORAL_CAPSULE | Freq: Three times a day (TID) | ORAL | 0 refills | Status: DC

## 2020-01-06 MED ORDER — FLUTICASONE PROPIONATE 50 MCG/ACT NA SUSP: 1.0000 | Freq: Every day | NASAL | 0 refills | Status: DC

## 2020-01-06 MED ORDER — AZITHROMYCIN 250 MG PO TABS: 250.0000 mg | ORAL_TABLET | Freq: Every day | ORAL | 0 refills | Status: DC

## 2020-01-06 MED ORDER — PREDNISONE 50 MG PO TABS: 50.0000 mg | ORAL_TABLET | Freq: Every day | ORAL | 0 refills | Status: DC

## 2020-01-06 NOTE — ED Triage Notes (Signed)
Pt here for positive home test for covid on 8/24; pt now having worsening cough, body aches, fever, SOB

## 2020-01-06 NOTE — Discharge Instructions (Signed)

## 2020-01-06 NOTE — ED Provider Notes (Signed)
[name]    CSN: [id number] Arrival date & time: 01/06/20  1357      History   Chief Complaint Chief Complaint  Patient presents with  . Covid Exposure    HPI Teresa Hudson is a 44 y.o. female presenting for evaluation/treatment of symptoms.  States that she took a home test for Covid on 8/24: Positive.  States that symptoms began last Saturday.  Having worsening cough, dyspnea with exertion, myalgias, fever.  Taking OTC cold medications with some relief.  Denies chest pain, palpitations, shortness of breath.    Past Medical History:  Diagnosis Date  . Anemia 1997  . Chlamydia   . Fibroids   . Fibroids   . Hypertension 2012 dx  . 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 Menorrhagia 08/27/2019  . Fibroids 08/27/2019  . Symptomatic anemia 08/26/2019  . Acne 06/25/2016  . Vitamin D insufficiency 10/20/2014  . Allergic rhinitis 10/18/2014  . Fatigue 01/29/2014  . Liver lesion, right lobe 01/29/2014  . 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 Hypertension 12/05/2012  . LGSIL on Pap smear of cervix 01/24/2012    Past Surgical History:  Procedure Laterality Date  . wisdom tooth removal      OB History    Gravida  5   Para  3   Term  3   Preterm      AB  2   Living  3     SAB      TAB  2   Ectopic      Multiple      Live Births  3            Home Medications    Prior to Admission medications   Medication Sig Start Date End Date Taking? Authorizing Provider  albuterol (VENTOLIN HFA) 108 (90 Base) MCG/ACT inhaler Inhale 2 puffs into the lungs every 4 (four) hours as needed for wheezing or shortness of breath. 01/06/20   Hall-Potvin, [place], PA-C  amLODipine (NORVASC) 5 MG tablet Take 1 tablet (5 mg total) by mouth daily. 12/24/19 01/23/20  Gildardo Pounds, NP  azithromycin (ZITHROMAX) 250 MG tablet Take 1 tablet (250 mg total) by  mouth daily. Take first 2 tablets together, then 1 every day until finished. 01/06/20   Hall-Potvin, [place], PA-C  benzonatate (TESSALON) 100 MG capsule Take 1 capsule (100 mg total) by mouth every 8 (eight) hours. 01/06/20   Hall-Potvin, [place], PA-C  cetirizine (ZYRTEC ALLERGY) 10 MG tablet Take 1 tablet (10 mg total) by mouth daily. 01/06/20   Hall-Potvin, [place], PA-C  docusate sodium (COLACE) 100 MG capsule Take 1 capsule (100 mg total) by mouth 2 (two) times daily. 08/27/19   Mariel Aloe, MD  fluticasone (FLONASE) 50 MCG/ACT nasal spray Place 1 spray into both nostrils daily. 01/06/20   Hall-Potvin, [place], PA-C  guaiFENesin-dextromethorphan (ROBITUSSIN DM) 100-10 MG/5ML syrup Take 15 mLs by mouth every 8 (eight) hours as needed for cough. 01/03/20   Gildardo Pounds, NP  megestrol (MEGACE) 40 MG tablet Take 2 tablets (80 mg total) by mouth 2 (two) times daily. 11/30/19   Ok Edwards, PA-C  predniSONE (DELTASONE) 50 MG tablet Take 1 tablet (50 mg total) by mouth daily with breakfast. 01/06/20   Hall-Potvin, [place], PA-C  Spacer/Aero-Holding Chambers (AEROCHAMBER PLUS FLO-VU MEDIUM) MISC 1 each by Other route once for 1 dose. 01/06/20  01/06/20  Hall-Potvin, [place], PA-C  hydrochlorothiazide (HYDRODIURIL) 12.5 MG tablet Take 1 tablet (12.5 mg total) by mouth daily. Patient not taking: Reported on 11/30/2017 06/25/16 08/26/19  Boykin Nearing, MD  potassium chloride SA (K-DUR,KLOR-CON) 20 MEQ tablet Take 1 tablet (20 mEq total) by mouth 2 (two) times daily. Patient not taking: Reported on 11/30/2017 03/03/13 08/26/19  Randal Buba, April, MD    Family History Family History  Problem Relation Age of Onset  . Diabetes Mother   . Breast cancer Mother 6  . Cancer Father        prostate  . Hypertension Father   . Asthma Sister   . GI problems Sister   . Thyroid cancer Sister     Social History Social History   Tobacco Use  . Smoking status: Never Smoker  . Smokeless tobacco: Never Used    Vaping Use  . Vaping Use: Never used  Substance Use Topics  . Alcohol use: Yes    Comment: ocassionally   . Drug use: No     Allergies   Sulfa antibiotics, Aspirin, Latex, Penicillins, and Nickel   Review of Systems As per HPI   Physical Exam Triage Vital Signs ED Triage Vitals [01/06/20 1506]  Enc Vitals Group     BP 97/64     Pulse Rate (!) 118     Resp 18     Temp (!) 101.8 F (38.8 C)     Temp Source Oral     SpO2 92 %     Weight      Height      Head Circumference      Peak Flow      Pain Score 7     Pain Loc      Pain Edu?      Excl. in [place]?    No data found.  Updated Vital Signs BP 105/72 (BP Location: Left Arm)   Pulse (!) 118   Temp (!) 101.8 F (38.8 C) (Oral)   Resp 18   SpO2 94%   Visual Acuity Right Eye Distance:   Left Eye Distance:   Bilateral Distance:    Right Eye Near:   Left Eye Near:    Bilateral Near:     Physical Exam Constitutional:      General: She is not in acute distress.    Appearance: She is obese. She is ill-appearing. She is not toxic-appearing or diaphoretic.  HENT:     Head: Normocephalic and atraumatic.     Mouth/Throat:     Mouth: Mucous membranes are moist.     Pharynx: Oropharynx is clear. No oropharyngeal exudate or posterior oropharyngeal erythema.  Eyes:     General: No scleral icterus.    Conjunctiva/sclera: Conjunctivae normal.     Pupils: Pupils are equal, round, and reactive to light.  Neck:     Comments: Trachea midline, negative JVD Cardiovascular:     Rate and Rhythm: Regular rhythm. Tachycardia present.     Heart sounds: No murmur heard.  No gallop.   Pulmonary:     Effort: Pulmonary effort is normal. No respiratory distress.     Breath sounds: Rales present. No wheezing or rhonchi.     Comments: Scattered Musculoskeletal:     Cervical back: Neck supple. No tenderness.  Lymphadenopathy:     Cervical: No cervical adenopathy.  Skin:    Capillary Refill: Capillary refill takes less than 2  seconds.     Coloration: Skin is not jaundiced or pale.  Findings: No rash.  Neurological:     General: No focal deficit present.     Mental Status: She is alert and oriented to person, place, and time.      UC Treatments / Results  Labs (all labs ordered are listed, but only abnormal results are displayed) Labs Reviewed  NOVEL CORONAVIRUS, NAA    EKG   Radiology DG Chest 2 View  Result Date: 01/06/2020 CLINICAL DATA:  COVID positive, worsening cough, body aches, fatigue, fever, SOB x5 days. Hx of HTN. No known lung conditions. Nonsmoker. EXAM: CHEST - 2 VIEW COMPARISON:  Chest radiograph 08/25/2019 FINDINGS: Borderline enlarged heart size. Normal mediastinal contours. Bilateral interstitial thickening. There are patchy airspace opacities in the left mid to lower lung. No pneumothorax or large pleural effusion. No acute finding in the visualized skeleton. IMPRESSION: Patchy airspace opacities in the left mid to lower lung concerning for pneumonia. Bilateral interstitial thickening likely secondary to atypical/viral infection. Electronically Signed   By: Audie Pinto M.D.   On: 01/06/2020 15:53    Procedures Procedures (including critical care time)  Medications Ordered in UC Medications  acetaminophen (TYLENOL) tablet 650 mg (650 mg Oral Given 01/06/20 1531)    Initial Impression / Assessment and Plan / UC Course  I have reviewed the triage vital signs and the nursing notes.  Pertinent labs & imaging results that were available during my care of the patient were reviewed by me and considered in my medical decision making (see chart for details).      Patient febrile, nontoxic, with SpO2 94%.  Covid PCR pending.  Patient to quarantine until results are back.  Given tachycardia, fever, hypoxia, duration of symptoms (day 9) perform chest x-ray in office.  This is reviewed by me radiology: Significant for patchy airspace opacities in the left mid to lower lung concerning  for pneumonia.  Bilateral interstitial thickening likely secondary to atypical/viral infection.  Will start Z-Pak, supportive care as outlined below.  ER return precautions discussed, patient verbalized understanding and is agreeable to plan. Final Clinical Impressions(s) / UC Diagnoses   Final diagnoses:  Encounter for screening for COVID-19  COVID-19  Pneumonia of left lower lobe due to infectious organism     Discharge Instructions     Tessalon for cough. Start flonase, atrovent nasal spray for nasal congestion/drainage. You can use over the counter nasal saline rinse such as neti pot for nasal congestion. Keep hydrated, your urine should be clear to pale yellow in color. Tylenol/motrin for fever and pain. Monitor for any worsening of symptoms, chest pain, shortness of breath, wheezing, swelling of the throat, go to the emergency department for further evaluation needed.     ED Prescriptions    Medication Sig Dispense Auth. Provider   predniSONE (DELTASONE) 50 MG tablet Take 1 tablet (50 mg total) by mouth daily with breakfast. 5 tablet Hall-Potvin, [place], PA-C   azithromycin (ZITHROMAX) 250 MG tablet Take 1 tablet (250 mg total) by mouth daily. Take first 2 tablets together, then 1 every day until finished. 6 tablet Hall-Potvin, [place], PA-C   albuterol (VENTOLIN HFA) 108 (90 Base) MCG/ACT inhaler Inhale 2 puffs into the lungs every 4 (four) hours as needed for wheezing or shortness of breath. 18 g Hall-Potvin, [place], PA-C   Spacer/Aero-Holding Chambers (AEROCHAMBER PLUS FLO-VU MEDIUM) MISC 1 each by Other route once for 1 dose. 1 each Hall-Potvin, [place], PA-C   benzonatate (TESSALON) 100 MG capsule Take 1 capsule (100 mg total) by mouth every 8 (eight)  hours. 21 capsule Hall-Potvin, [place], PA-C   cetirizine (ZYRTEC ALLERGY) 10 MG tablet Take 1 tablet (10 mg total) by mouth daily. 30 tablet Hall-Potvin, [place], PA-C   fluticasone (FLONASE) 50 MCG/ACT nasal spray Place 1  spray into both nostrils daily. 16 g Hall-Potvin, [place], PA-C     PDMP not reviewed this encounter.   Hall-Potvin, [place], [place] 01/06/20 1629

## 2020-01-07 LAB — NOVEL CORONAVIRUS, NAA: SARS-CoV-2, NAA: NOT DETECTED

## 2020-01-09 ENCOUNTER — Other Ambulatory Visit: Payer: Self-pay | Admitting: Nurse Practitioner

## 2020-01-09 MED ORDER — DM-GUAIFENESIN ER 30-600 MG PO TB12: 1.0000 | ORAL_TABLET | Freq: Two times a day (BID) | ORAL | 0 refills | Status: AC

## 2020-01-09 NOTE — Telephone Encounter (Signed)
I already spoke with the patient and responded on the 25th or 26th

## 2020-01-30 ENCOUNTER — Encounter: Payer: Self-pay | Admitting: Nurse Practitioner

## 2020-01-30 ENCOUNTER — Other Ambulatory Visit: Payer: Self-pay

## 2020-01-30 ENCOUNTER — Ambulatory Visit: Payer: Self-pay | Attending: Nurse Practitioner | Admitting: Nurse Practitioner

## 2020-01-30 VITALS — BP 129/86 | HR 118 | Temp 97.7°F | Ht 64.0 in | Wt 189.0 lb

## 2020-01-30 DIAGNOSIS — L700 Acne vulgaris: Secondary | ICD-10-CM | POA: Insufficient documentation

## 2020-01-30 DIAGNOSIS — Z8616 Personal history of COVID-19: Secondary | ICD-10-CM | POA: Insufficient documentation

## 2020-01-30 DIAGNOSIS — I10 Essential (primary) hypertension: Secondary | ICD-10-CM | POA: Insufficient documentation

## 2020-01-30 DIAGNOSIS — Z131 Encounter for screening for diabetes mellitus: Secondary | ICD-10-CM

## 2020-01-30 DIAGNOSIS — Z7952 Long term (current) use of systemic steroids: Secondary | ICD-10-CM | POA: Insufficient documentation

## 2020-01-30 DIAGNOSIS — Z79899 Other long term (current) drug therapy: Secondary | ICD-10-CM | POA: Insufficient documentation

## 2020-01-30 DIAGNOSIS — Z862 Personal history of diseases of the blood and blood-forming organs and certain disorders involving the immune mechanism: Secondary | ICD-10-CM

## 2020-01-30 MED ORDER — CLINDAMYCIN PHOS-BENZOYL PEROX 1-5 % EX GEL: Freq: Two times a day (BID) | CUTANEOUS | 0 refills | Status: DC

## 2020-01-30 MED ORDER — AMLODIPINE BESYLATE 5 MG PO TABS: 5.0000 mg | ORAL_TABLET | Freq: Every day | ORAL | 1 refills | Status: DC

## 2020-01-30 MED FILL — CLINDAMYCIN PHOS-BENZOYL PE: 1-5 | 30 days supply | Qty: 25 | Fill #0

## 2020-01-30 MED FILL — AMLODIPINE BESYLATE 5 MG TA: 5 | 30 days supply | Qty: 30 | Fill #0

## 2020-01-30 NOTE — Progress Notes (Signed)
Assessment & Plan:  Diagnoses and all orders for this visit:  Essential hypertension -     amLODipine (NORVASC) 5 MG tablet; Take 1 tablet (5 mg total) by mouth daily. -     CMP14+EGFR Continue all antihypertensives as prescribed.  Remember to bring in your blood pressure log with you for your follow up appointment.  DASH/Mediterranean Diets are healthier choices for HTN.    Acne vulgaris -     clindamycin-benzoyl peroxide (BENZACLIN) gel; Apply topically 2 (two) times daily.  History of anemia -     CBC -     Iron, TIBC and Ferritin Panel -     TSH  Encounter for screening for diabetes mellitus -     Hemoglobin A1c    Patient has been counseled on age-appropriate routine health concerns for screening and prevention. These are reviewed and up-to-date. Referrals have been placed accordingly. Immunizations are up-to-date or declined.    Subjective:   HPI Teresa Hudson 44 y.o. female presents to office today for follow-up.  has a past medical history of Anemia (1997), Chlamydia, Fibroids, Fibroids, Hypertension (2012 dx), and LSIL (low grade squamous intraepithelial lesion) on Pap smear (01/24/2012).    She inquires as to how long she has to wait to receive the Covid vaccine after being diagnosed with COVID and was instructed that the waiting period is 90 days.   ESSENTIAL HYPERTENSION Blood pressure is well controlled with amlodipine 5 mg daily as prescribed. Denies chest pain, shortness of breath, palpitations, lightheadedness, dizziness, headaches or BLE edema.  EKG today shows high normal heart rate of 100.  She questions whether she can begin exercising with the blood pressure managed [place]acian that she is taking and I instructed her that it is certainly okay to exercise as this will help her lose weight and also likely bring her blood pressure down.  I have instructed her to monitor her heart rate and if resting heart rate greater than 110 she is to send me a MyChart message  or call the office.  May need cardiology referral.  BP Readings from Last 3 Encounters:  01/30/20 129/86  01/06/20 105/72  12/20/19 (!) 144/103    Review of Systems  Constitutional: Negative for fever, malaise/fatigue and weight loss.  HENT: Negative.  Negative for nosebleeds.   Eyes: Negative.  Negative for blurred vision, double vision and photophobia.  Respiratory: Negative.  Negative for cough and shortness of breath.   Cardiovascular: Negative.  Negative for chest pain, palpitations and leg swelling.  Gastrointestinal: Negative.  Negative for heartburn, nausea and vomiting.  Musculoskeletal: Negative.  Negative for myalgias.  Skin:       Acne vulgaris  Neurological: Negative.  Negative for dizziness, focal weakness, seizures and headaches.  Psychiatric/Behavioral: Negative.  Negative for suicidal ideas.    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/2012   Colpo 01/24/12    Past Surgical History:  Procedure Laterality Date   wisdom tooth removal      Family History  Problem Relation Age of Onset   Diabetes Mother    Breast cancer Mother 53   Cancer Father        prostate   Hypertension Father    Asthma Sister    GI problems Sister    Thyroid cancer Sister  Social History Reviewed with no changes to be made today.   Outpatient Medications Prior to Visit  Medication Sig Dispense Refill   albuterol (VENTOLIN HFA) 108 (90 Base) MCG/ACT inhaler Inhale 2 puffs into the lungs every 4 (four) hours as needed for wheezing or shortness of breath. 18 g 0   benzonatate (TESSALON) 100 MG capsule Take 1 capsule (100 mg total) by mouth every 8 (eight) hours. 21 capsule 0   cetirizine (ZYRTEC ALLERGY) 10 MG tablet Take 1 tablet (10 mg total) by mouth daily. 30 tablet 0   docusate sodium (COLACE) 100 MG capsule Take 1  capsule (100 mg total) by mouth 2 (two) times daily. 30 capsule 0   fluticasone (FLONASE) 50 MCG/ACT nasal spray Place 1 spray into both nostrils daily. 16 g 0   azithromycin (ZITHROMAX) 250 MG tablet Take 1 tablet (250 mg total) by mouth daily. Take first 2 tablets together, then 1 every day until finished. 6 tablet 0   predniSONE (DELTASONE) 50 MG tablet Take 1 tablet (50 mg total) by mouth daily with breakfast. 5 tablet 0   megestrol (MEGACE) 40 MG tablet Take 2 tablets (80 mg total) by mouth 2 (two) times daily. (Patient not taking: Reported on 01/30/2020) 90 tablet 0   amLODipine (NORVASC) 5 MG tablet Take 1 tablet (5 mg total) by mouth daily. 30 tablet 1   No facility-administered medications prior to visit.    Allergies  Allergen Reactions   Sulfa Antibiotics Anaphylaxis   Aspirin Swelling   Latex Itching and Swelling   Penicillins Swelling    Facial swelling   Nickel Rash       Objective:    BP 129/86 (BP Location: Left Arm, Patient Position: Sitting, Cuff Size: Normal)    Pulse (!) 118    Temp 97.7 F (36.5 C) (Temporal)    Ht '5\' 4"'  (1.626 m)    Wt 189 lb (85.7 kg)    SpO2 100%    BMI 32.44 kg/m  Wt Readings from Last 3 Encounters:  01/30/20 189 lb (85.7 kg)  12/20/19 187 lb (84.8 kg)  11/06/19 182 lb 1.6 oz (82.6 kg)    Physical Exam Vitals and nursing note reviewed.  Constitutional:      Appearance: She is well-developed.  HENT:     Head: Normocephalic and atraumatic.  Cardiovascular:     Rate and Rhythm: Normal rate and regular rhythm.     Heart sounds: Normal heart sounds. No murmur heard.  No friction rub. No gallop.   Pulmonary:     Effort: Pulmonary effort is normal. No tachypnea or respiratory distress.     Breath sounds: Normal breath sounds. No decreased breath sounds, wheezing, rhonchi or rales.  Chest:     Chest wall: No tenderness.  Abdominal:     General: Bowel sounds are normal.     Palpations: Abdomen is soft.  Musculoskeletal:         General: Normal range of motion.     Cervical back: Normal range of motion.  Skin:    General: Skin is warm and dry.     Comments: Acne vulgaris with scarring on face  Neurological:     Mental Status: She is alert and oriented to person, place, and time.     Coordination: Coordination normal.  Psychiatric:        Behavior: Behavior normal. Behavior is cooperative.        Thought Content: Thought content normal.  Judgment: Judgment normal.          Patient has been counseled extensively about nutrition and exercise as well as the importance of adherence with medications and regular follow-up. The patient was given clear instructions to go to ER or return to medical center if symptoms don't improve, worsen or new problems develop. The patient verbalized understanding.   Follow-up: Return in about 3 months (around 04/30/2020) for NEEDS ORANGE CARD/CAFA APP.   Gildardo Pounds, FNP-BC [facility] [place], [place]   01/30/2020, 1:52 PM

## 2020-01-31 ENCOUNTER — Other Ambulatory Visit: Payer: Self-pay | Admitting: Nurse Practitioner

## 2020-01-31 LAB — CBC
Hematocrit: 28 % — ABNORMAL LOW (ref 34.0–46.6)
Hemoglobin: 7.3 g/dL — ABNORMAL LOW (ref 11.1–15.9)
MCH: 17.9 pg — ABNORMAL LOW (ref 26.6–33.0)
MCHC: 26.1 g/dL — ABNORMAL LOW (ref 31.5–35.7)
MCV: 69 fL — ABNORMAL LOW (ref 79–97)
Platelets: 527 10*3/uL — ABNORMAL HIGH (ref 150–450)
RBC: 4.07 x10E6/uL (ref 3.77–5.28)
RDW: 17.2 % — ABNORMAL HIGH (ref 11.7–15.4)
WBC: 6.2 10*3/uL (ref 3.4–10.8)

## 2020-01-31 LAB — IRON,TIBC AND FERRITIN PANEL
Ferritin: 10 ng/mL — ABNORMAL LOW (ref 15–150)
Iron Saturation: 2 % — CL (ref 15–55)
Iron: 9 ug/dL — CL (ref 27–159)
Total Iron Binding Capacity: 440 ug/dL (ref 250–450)
UIBC: 431 ug/dL — ABNORMAL HIGH (ref 131–425)

## 2020-01-31 LAB — TSH: TSH: 2.04 u[IU]/mL (ref 0.450–4.500)

## 2020-01-31 LAB — CMP14+EGFR
ALT: 11 IU/L (ref 0–32)
AST: 14 IU/L (ref 0–40)
Albumin/Globulin Ratio: 1.3 (ref 1.2–2.2)
Albumin: 4 g/dL (ref 3.8–4.8)
Alkaline Phosphatase: 65 IU/L (ref 44–121)
BUN/Creatinine Ratio: 10 (ref 9–23)
BUN: 8 mg/dL (ref 6–24)
Bilirubin Total: 0.3 mg/dL (ref 0.0–1.2)
CO2: 22 mmol/L (ref 20–29)
Calcium: 9.1 mg/dL (ref 8.7–10.2)
Chloride: 104 mmol/L (ref 96–106)
Creatinine, Ser: 0.77 mg/dL (ref 0.57–1.00)
GFR calc Af Amer: 111 mL/min/{1.73_m2} (ref >59)
GFR calc non Af Amer: 96 mL/min/{1.73_m2} (ref >59)
Globulin, Total: 3.1 g/dL (ref 1.5–4.5)
Glucose: 91 mg/dL (ref 65–99)
Potassium: 4.1 mmol/L (ref 3.5–5.2)
Sodium: 139 mmol/L (ref 134–144)
Total Protein: 7.1 g/dL (ref 6.0–8.5)

## 2020-01-31 LAB — HEMOGLOBIN A1C
Est. average glucose Bld gHb Est-mCnc: 105 mg/dL
Hgb A1c MFr Bld: 5.3 % (ref 4.8–5.6)

## 2020-01-31 MED ORDER — FERROUS SULFATE 325 (65 FE) MG PO TBEC: 325.0000 mg | DELAYED_RELEASE_TABLET | Freq: Two times a day (BID) | ORAL | 6 refills | Status: DC

## 2020-02-01 ENCOUNTER — Encounter: Payer: Self-pay | Admitting: Medical

## 2020-02-01 ENCOUNTER — Ambulatory Visit (INDEPENDENT_AMBULATORY_CARE_PROVIDER_SITE_OTHER): Payer: Self-pay | Admitting: Medical

## 2020-02-01 ENCOUNTER — Other Ambulatory Visit: Payer: Self-pay

## 2020-02-01 VITALS — BP 134/87 | HR 106 | Ht 64.0 in | Wt 187.4 lb

## 2020-02-01 DIAGNOSIS — T8332XA Displacement of intrauterine contraceptive device, initial encounter: Secondary | ICD-10-CM

## 2020-02-01 DIAGNOSIS — N920 Excessive and frequent menstruation with regular cycle: Secondary | ICD-10-CM

## 2020-02-01 DIAGNOSIS — Z30431 Encounter for routine checking of intrauterine contraceptive device: Secondary | ICD-10-CM

## 2020-02-01 MED FILL — FERROUS SULFATE 325 MG TAB: 325 (65 FE) | 90 days supply | Qty: 180 | Fill #0

## 2020-02-01 NOTE — Progress Notes (Signed)
  History:  [name] is a 44 y.o. [id number] who presents to clinic today for string check. The patient had IUD placed in our office ~ 6 weeks ago. She states continued bleeding since insertion that has not responded to Megace. She is concerned that she may have expelled the device as she believes she saw it in a clot last week. She states bleeding is lighter today than it has been recently.    The following portions of the patient's history were reviewed and updated as appropriate: allergies, current medications, family history, past medical history, social history, past surgical history and problem list.  Review of Systems:  Review of Systems  Constitutional: Positive for malaise/fatigue. Negative for fever.  Gastrointestinal: Negative for abdominal pain.  Genitourinary:       + vaginal bleeding with small clots      Objective:  Physical Exam BP 134/87   Pulse (!) 106   Ht 5\' 4"  (1.626 m)   Wt 187 lb 6.4 oz (85 kg)   BMI 32.17 kg/m  Physical Exam Vitals and nursing note reviewed. Exam conducted with a chaperone present.  Constitutional:      General: She is not in acute distress.    Appearance: She is well-developed.  HENT:     Head: Normocephalic and atraumatic.  Cardiovascular:     Rate and Rhythm: Tachycardia present.  Pulmonary:     Effort: Pulmonary effort is normal.  Abdominal:     Palpations: Abdomen is soft.  Genitourinary:    General: Normal vulva.     Vagina: Bleeding (small blood with very small clots noted) present. No vaginal discharge.     Cervix: No cervical motion tenderness, discharge or friability.     Comments: IUD strings were not visualized today  Skin:    General: Skin is warm and dry.     Findings: No erythema.  Neurological:     Mental Status: She is alert and oriented to person, place, and time.     Assessment & Plan:  1. Menorrhagia  2. Intrauterine contraceptive device threads lost, initial encounter - US PELVIC COMPLETE WITH  TRANSVAGINAL; Future - Patient will follow-up with surgeon after [place] to discuss other options for bleeding control as IUD has likely been expelled  - Information given regarding Depo Provera and Novasure ablation. Patient does want to avoid hysterectomy.   Approximately 15 minutes of total time was spent with this patient on history taking, exam, coordination of care and documentation  Danielle Rankin 02/01/2020 11:42 AM

## 2020-02-01 NOTE — Progress Notes (Signed)
Pt having moderate bleeding w/ blood clots.

## 2020-02-01 NOTE — Patient Instructions (Addendum)
Contraception Choices Contraception, also called birth control, means things to use or ways to try not to get pregnant. Hormonal birth control This kind of birth control uses hormones. Here are some types of hormonal birth control:  A tube that is put under skin of the arm (implant). The tube can stay in for as long as 3 years.  Shots to get every 3 months (injections).  Pills to take every day (birth control pills).  A patch to change 1 time each week for 3 weeks (birth control patch). After that, the patch is taken off for 1 week.  Endometrial Ablation Endometrial ablation is a procedure that destroys the thin inner layer of the lining of the uterus (endometrium). This procedure may be done: To stop heavy periods. To stop bleeding that is causing anemia. To control irregular bleeding. To treat bleeding caused by small tumors (fibroids) in the endometrium. This procedure is often an alternative to major surgery, such as removal of the uterus and cervix (hysterectomy). As a result of this procedure: You may not be able to have children. However, if you are premenopausal (you have not gone through menopause): You may still have a small chance of getting pregnant. You will need to use a reliable method of birth control after the procedure to prevent pregnancy. You may stop having a menstrual period, or you may have only a small amount of bleeding during your period. Menstruation may return several years after the procedure. Tell a health care provider about: Any allergies you have. All medicines you are taking, including vitamins, herbs, eye drops, creams, and over-the-counter medicines. Any problems you or family members have had with the use of anesthetic medicines. Any blood disorders you have. Any surgeries you have had. Any medical conditions you have. What are the risks? Generally, this is a safe procedure. However, problems may occur, including: A hole (perforation) in the  uterus or bowel. Infection of the uterus, bladder, or vagina. Bleeding. Damage to other structures or organs. An air bubble in the lung (air embolus). Problems with pregnancy after the procedure. Failure of the procedure. Decreased ability to diagnose cancer in the endometrium. What happens before the procedure? You will have tests of your endometrium to make sure there are no pre-cancerous cells or cancer cells present. You may have an ultrasound of the uterus. You may be given medicines to thin the endometrium. Ask your health care provider about: Changing or stopping your regular medicines. This is especially important if you take diabetes medicines or blood thinners. Taking medicines such as aspirin and ibuprofen. These medicines can thin your blood. Do not take these medicines before your procedure if your doctor tells you not to. Plan to have someone take you home from the hospital or clinic. What happens during the procedure?  You will lie on an exam table with your feet and legs supported as in a pelvic exam. To lower your risk of infection: Your health care team will wash or sanitize their hands and put on germ-free (sterile) gloves. Your genital area will be washed with soap. An IV tube will be inserted into one of your veins. You will be given a medicine to help you relax (sedative). A surgical instrument with a light and camera (resectoscope) will be inserted into your vagina and moved into your uterus. This allows your surgeon to see inside your uterus. Endometrial tissue will be removed using one of the following methods: Radiofrequency. This method uses a radiofrequency-alternating electric current to remove  the endometrium. Cryotherapy. This method uses extreme cold to freeze the endometrium. Heated-free liquid. This method uses a heated saltwater (saline) solution to remove the endometrium. Microwave. This method uses high-energy microwaves to heat up the endometrium and  remove it. Thermal balloon. This method involves inserting a catheter with a balloon tip into the uterus. The balloon tip is filled with heated fluid to remove the endometrium. The procedure may vary among health care providers and hospitals. What happens after the procedure? Your blood pressure, heart rate, breathing rate, and blood oxygen level will be monitored until the medicines you were given have worn off. As tissue healing occurs, you may notice vaginal bleeding for 4-6 weeks after the procedure. You may also experience: Cramps. Thin, watery vaginal discharge that is light pink or brown in color. A need to urinate more frequently than usual. Nausea. Do not drive for 24 hours if you were given a sedative. Do not have sex or insert anything into your vagina until your health care provider approves. Summary Endometrial ablation is done to treat the many causes of heavy menstrual bleeding. The procedure may be done only after medications have been tried to control the bleeding. Plan to have someone take you home from the hospital or clinic. This information is not intended to replace advice given to you by your health care provider. Make sure you discuss any questions you have with your health care provider. Document Revised: 10/11/2017 Document Reviewed: 05/13/2016 Elsevier Patient Education  [place].  A ring to put in the vagina. The ring is left in for 3 weeks. Then it is taken out of the vagina for 1 week. Then a new ring is put in.  Pills to take after unprotected sex (emergency birth control pills). Barrier birth control Here are some types of barrier birth control:  A thin covering that is put on the penis before sex (female condom). The covering is thrown away after sex.  A soft, loose covering that is put in the vagina before sex (female condom). The covering is thrown away after sex.  A rubber bowl that sits over the cervix (diaphragm). The bowl must be made for  you. The bowl is put into the vagina before sex. The bowl is left in for 6-8 hours after sex. It is taken out within 24 hours.  A small, soft cup that fits over the cervix (cervical cap). The cup must be made for you. The cup can be left in for 6-8 hours after sex. It is taken out within 48 hours.  A sponge that is put into the vagina before sex. It must be left in for at least 6 hours after sex. It must be taken out within 30 hours. Then it is thrown away.  A chemical that kills or stops sperm from getting into the uterus (spermicide). It may be a pill, cream, jelly, or foam to put in the vagina. The chemical should be used at least 10-15 minutes before sex. IUD (intrauterine) birth control An IUD is a small, T-shaped piece of plastic. It is put inside the uterus. There are two kinds:  Hormone IUD. This kind can stay in for 3-5 years.  Copper IUD. This kind can stay in for 10 years. Permanent birth control Here are some types of permanent birth control:  Surgery to block the fallopian tubes.  Having an insert put into each fallopian tube.  Surgery to tie off the tubes that carry sperm (vasectomy). Natural planning birth control  Here are some types of natural planning birth control:  Not having sex on the days the woman could get pregnant.  Using a calendar: ? To keep track of the length of each period. ? To find out what days pregnancy can happen. ? To plan to not have sex on days when pregnancy can happen.  Watching for symptoms of ovulation and not having sex during ovulation. One way the woman can check for ovulation is to check her temperature.  Waiting to have sex until after ovulation. Summary  Contraception, also called birth control, means things to use or ways to try not to get pregnant.  Hormonal methods of birth control include implants, injections, pills, patches, vaginal rings, and emergency birth control pills.  Barrier methods of birth control can include female  condoms, female condoms, diaphragms, cervical caps, sponges, and spermicides.  There are two types of IUD (intrauterine device) birth control. An IUD can be put in a woman's uterus to prevent pregnancy for 3-5 years.  Permanent sterilization can be done through a procedure for males, females, or both.  Natural planning methods involve not having sex on the days when the woman could get pregnant. This information is not intended to replace advice given to you by your health care provider. Make sure you discuss any questions you have with your health care provider. Document Revised: 08/16/2018 Document Reviewed: 05/06/2016 Elsevier Patient Education  [place].

## 2020-02-04 ENCOUNTER — Encounter: Payer: Self-pay | Admitting: Medical

## 2020-02-07 ENCOUNTER — Other Ambulatory Visit: Payer: Self-pay | Admitting: Medical

## 2020-02-07 DIAGNOSIS — N939 Abnormal uterine and vaginal bleeding, unspecified: Secondary | ICD-10-CM

## 2020-02-07 MED ORDER — MEDROXYPROGESTERONE ACETATE 10 MG PO TABS: 10.0000 mg | ORAL_TABLET | Freq: Every day | ORAL | 0 refills | Status: DC

## 2020-02-07 MED FILL — MEDROXYPROGESTERONE ACETATE: 10 | 10 days supply | Qty: 10 | Fill #0

## 2020-02-11 ENCOUNTER — Ambulatory Visit: Payer: Self-pay

## 2020-02-21 ENCOUNTER — Ambulatory Visit
Admission: RE | Admit: 2020-02-21 | Discharge: 2020-02-21 | Disposition: A | Discharge status: Home / Self Care | Payer: Self-pay | Source: Ambulatory Visit | Attending: Medical | Admitting: Medical

## 2020-02-21 ENCOUNTER — Other Ambulatory Visit: Payer: Self-pay | Admitting: Medical

## 2020-02-21 ENCOUNTER — Other Ambulatory Visit: Payer: Self-pay

## 2020-02-21 DIAGNOSIS — T8332XA Displacement of intrauterine contraceptive device, initial encounter: Secondary | ICD-10-CM | POA: Insufficient documentation

## 2020-02-21 DIAGNOSIS — N920 Excessive and frequent menstruation with regular cycle: Secondary | ICD-10-CM | POA: Insufficient documentation

## 2020-03-06 MED FILL — AMLODIPINE BESYLATE 5 MG TA: 5 | 30 days supply | Qty: 30 | Fill #1

## 2020-04-08 MED FILL — AMLODIPINE BESYLATE 5 MG TA: 5 | 30 days supply | Qty: 30 | Fill #2

## 2020-04-14 ENCOUNTER — Ambulatory Visit: Payer: Self-pay | Admitting: Obstetrics & Gynecology

## 2020-04-17 ENCOUNTER — Other Ambulatory Visit: Payer: Self-pay | Admitting: *Deleted

## 2020-04-17 ENCOUNTER — Emergency Department (HOSPITAL_BASED_OUTPATIENT_CLINIC_OR_DEPARTMENT_OTHER): Payer: Self-pay

## 2020-04-17 ENCOUNTER — Other Ambulatory Visit: Payer: Self-pay

## 2020-04-17 ENCOUNTER — Encounter (HOSPITAL_BASED_OUTPATIENT_CLINIC_OR_DEPARTMENT_OTHER): Payer: Self-pay | Admitting: *Deleted

## 2020-04-17 ENCOUNTER — Emergency Department (HOSPITAL_BASED_OUTPATIENT_CLINIC_OR_DEPARTMENT_OTHER)
Admission: EM | Admit: 2020-04-17 | Discharge: 2020-04-18 | Disposition: A | Discharge status: Home / Self Care | Payer: Self-pay | Attending: Emergency Medicine | Admitting: Emergency Medicine

## 2020-04-17 DIAGNOSIS — Z79899 Other long term (current) drug therapy: Secondary | ICD-10-CM | POA: Insufficient documentation

## 2020-04-17 DIAGNOSIS — D62 Acute posthemorrhagic anemia: Secondary | ICD-10-CM | POA: Insufficient documentation

## 2020-04-17 DIAGNOSIS — R0600 Dyspnea, unspecified: Secondary | ICD-10-CM | POA: Insufficient documentation

## 2020-04-17 DIAGNOSIS — Z7952 Long term (current) use of systemic steroids: Secondary | ICD-10-CM | POA: Insufficient documentation

## 2020-04-17 DIAGNOSIS — I10 Essential (primary) hypertension: Secondary | ICD-10-CM | POA: Insufficient documentation

## 2020-04-17 DIAGNOSIS — N939 Abnormal uterine and vaginal bleeding, unspecified: Secondary | ICD-10-CM

## 2020-04-17 DIAGNOSIS — Z91040 Latex allergy status: Secondary | ICD-10-CM | POA: Insufficient documentation

## 2020-04-17 DIAGNOSIS — N920 Excessive and frequent menstruation with regular cycle: Secondary | ICD-10-CM | POA: Insufficient documentation

## 2020-04-17 LAB — TROPONIN I (HIGH SENSITIVITY): Troponin I (High Sensitivity): <2 ng/L (ref <18)

## 2020-04-17 LAB — CBC
HCT: 35.4 % — ABNORMAL LOW (ref 36.0–46.0)
Hemoglobin: 10.7 g/dL — ABNORMAL LOW (ref 12.0–15.0)
MCH: 23.9 pg — ABNORMAL LOW (ref 26.0–34.0)
MCHC: 30.2 g/dL (ref 30.0–36.0)
MCV: 79.2 fL — ABNORMAL LOW (ref 80.0–100.0)
Platelets: 318 10*3/uL (ref 150–400)
RBC: 4.47 MIL/uL (ref 3.87–5.11)
RDW: 18.4 % — ABNORMAL HIGH (ref 11.5–15.5)
WBC: 7.4 10*3/uL (ref 4.0–10.5)
nRBC: 0 % (ref 0.0–0.2)

## 2020-04-17 LAB — BASIC METABOLIC PANEL
Anion gap: 7 (ref 5–15)
BUN: 11 mg/dL (ref 6–20)
CO2: 25 mmol/L (ref 22–32)
Calcium: 9 mg/dL (ref 8.9–10.3)
Chloride: 107 mmol/L (ref 98–111)
Creatinine, Ser: 0.79 mg/dL (ref 0.44–1.00)
GFR, Estimated: >60 mL/min (ref >60)
Glucose, Bld: 107 mg/dL — ABNORMAL HIGH (ref 70–99)
Potassium: 3.5 mmol/L (ref 3.5–5.1)
Sodium: 139 mmol/L (ref 135–145)

## 2020-04-17 MED ORDER — MEDROXYPROGESTERONE ACETATE 10 MG PO TABS: 10.0000 mg | ORAL_TABLET | Freq: Every day | ORAL | 0 refills | Status: DC

## 2020-04-17 MED FILL — MEDROXYPROGESTERONE ACETATE: 10 | 10 days supply | Qty: 10 | Fill #0

## 2020-04-17 NOTE — ED Triage Notes (Signed)
Heavy vaginal bleeding with her periods for "a while". 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

## 2020-04-18 ENCOUNTER — Encounter (HOSPITAL_BASED_OUTPATIENT_CLINIC_OR_DEPARTMENT_OTHER): Payer: Self-pay | Admitting: Emergency Medicine

## 2020-04-18 LAB — URINALYSIS, ROUTINE W REFLEX MICROSCOPIC
Bilirubin Urine: NEGATIVE
Glucose, UA: NEGATIVE mg/dL
Ketones, ur: NEGATIVE mg/dL
Nitrite: NEGATIVE
Protein, ur: NEGATIVE mg/dL
Specific Gravity, Urine: 1.025 (ref 1.005–1.030)
pH: 6.5 (ref 5.0–8.0)

## 2020-04-18 LAB — CBC
HCT: 30.8 % — ABNORMAL LOW (ref 36.0–46.0)
Hemoglobin: 9.3 g/dL — ABNORMAL LOW (ref 12.0–15.0)
MCH: 24.3 pg — ABNORMAL LOW (ref 26.0–34.0)
MCHC: 30.2 g/dL (ref 30.0–36.0)
MCV: 80.4 fL (ref 80.0–100.0)
Platelets: 284 10*3/uL (ref 150–400)
RBC: 3.83 MIL/uL — ABNORMAL LOW (ref 3.87–5.11)
RDW: 18.4 % — ABNORMAL HIGH (ref 11.5–15.5)
WBC: 7.4 10*3/uL (ref 4.0–10.5)
nRBC: 0 % (ref 0.0–0.2)

## 2020-04-18 LAB — PREGNANCY, URINE: Preg Test, Ur: NEGATIVE

## 2020-04-18 LAB — TROPONIN I (HIGH SENSITIVITY): Troponin I (High Sensitivity): <2 ng/L (ref <18)

## 2020-04-18 LAB — URINALYSIS, MICROSCOPIC (REFLEX): RBC / HPF: >50 RBC/hpf (ref 0–5)

## 2020-04-18 MED ORDER — SODIUM CHLORIDE 0.9 % IV BOLUS
1000.0000 mL | Freq: Once | INTRAVENOUS | Status: AC
  Administered 2020-04-18: 1000 mL via INTRAVENOUS

## 2020-04-18 NOTE — ED Provider Notes (Signed)
[facility] Provider Note: Georgena Spurling, MD, FACEP  CSN: [id number] MRN: [id number] ARRIVAL: 04/17/20 at 2055 ROOM: [place]  Chest Pain   HISTORY OF PRESENT ILLNESS  04/18/20 1:29 AM Teresa Hudson is a 44 y.o. female with a history of anemia currently on iron supplementation.  She is here with particularly heavy menses.  She states she has lost "a lot of blood" over the past several days.  She is here because of palpitations by which she means her heart beating rapidly.  It was as high as 107 at home and has been higher here in the ED.  She had some chest discomfort which was mild but concerning yesterday.  This occasioned her to come to the ED.  She is not having any pain now.  She also had some mild dyspnea on exertion yesterday.  Her hemoglobin was 7.3 on 01/30/2020 prior to being placed on iron supplement.  Her MCV at that time was 39   Past Medical History:  Diagnosis Date  . Anemia 1997  . Chlamydia   . Fibroids   . Hypertension 2012 dx  . LSIL (low grade squamous intraepithelial lesion) on Pap smear 01/24/2012   Colpo 01/24/12    Past Surgical History:  Procedure Laterality Date  . wisdom tooth removal      Family History  Problem Relation Age of Onset  . Diabetes Mother   . Breast cancer Mother 71  . Cancer Father        prostate  . Hypertension Father   . Asthma Sister   . GI problems Sister   . Thyroid cancer Sister     Social History   Tobacco Use  . Smoking status: Never Smoker  . Smokeless tobacco: Never Used  Vaping Use  . Vaping Use: Never used  Substance Use Topics  . Alcohol use: Yes    Comment: ocassionally   . Drug use: No    Prior to Admission medications   Medication Sig Start Date End Date Taking? Authorizing Provider  amLODipine (NORVASC) 5 MG tablet Take 1 tablet (5 mg total) by mouth daily. 01/30/20 04/29/20  Gildardo Pounds, NP  cetirizine (ZYRTEC ALLERGY) 10 MG tablet Take 1 tablet (10 mg total)  by mouth daily. 01/06/20   Hall-Potvin, [place], PA-C  clindamycin-benzoyl peroxide (BENZACLIN) gel Apply topically 2 (two) times daily. 01/30/20   Gildardo Pounds, NP  docusate sodium (COLACE) 100 MG capsule Take 1 capsule (100 mg total) by mouth 2 (two) times daily. 08/27/19   Mariel Aloe, MD  ferrous sulfate 325 (65 FE) MG EC tablet Take 1 tablet (325 mg total) by mouth 2 (two) times daily with a meal. 01/31/20 03/01/20  Gildardo Pounds, NP  medroxyPROGESTERone (PROVERA) 10 MG tablet Take 1 tablet (10 mg total) by mouth daily for 10 days. 04/17/20 04/27/20  Starr Lake, CNM  Multiple Vitamin (MULTIVITAMIN) tablet Take 1 tablet by mouth daily.    [provider]  albuterol (VENTOLIN HFA) 108 (90 Base) MCG/ACT inhaler Inhale 2 puffs into the lungs every 4 (four) hours as needed for wheezing or shortness of breath. Patient not taking: Reported on 02/01/2020 01/06/20 04/18/20  Hall-Potvin, [place], PA-C  fluticasone (FLONASE) 50 MCG/ACT nasal spray Place 1 spray into both nostrils daily. Patient not taking: Reported on 02/01/2020 01/06/20 04/18/20  Hall-Potvin, [place], PA-C  hydrochlorothiazide (HYDRODIURIL) 12.5 MG tablet Take 1 tablet (12.5 mg total) by mouth daily. Patient not taking: Reported  on 11/30/2017 06/25/16 08/26/19  Boykin Nearing, MD  potassium chloride SA (K-DUR,KLOR-CON) 20 MEQ tablet Take 1 tablet (20 mEq total) by mouth 2 (two) times daily. Patient not taking: Reported on 11/30/2017 03/03/13 08/26/19  Palumbo, April, MD    Allergies Sulfa antibiotics, Aspirin, Latex, Penicillins, and Nickel   REVIEW OF SYSTEMS  Negative except as noted here or in the History of Present Illness.   PHYSICAL EXAMINATION  Initial Vital Signs Blood pressure 127/85, pulse (!) 108, temperature 99 F (37.2 C), temperature source Oral, resp. rate 18, height 5\' 4"  (1.626 m), weight 87.2 kg, last menstrual period 04/12/2020, SpO2 100 %.  Examination General: Well-developed,  well-nourished female in no acute distress; appearance consistent with age of record HENT: normocephalic; atraumatic Eyes: pupils equal, round and reactive to light; extraocular muscles intact Neck: supple Heart: regular rate and rhythm; tachycardia Lungs: clear to auscultation bilaterally Abdomen: soft; nondistended; nontender; bowel sounds present Extremities: No deformity; full range of motion; pulses normal Neurologic: Awake, alert and oriented; motor function intact in all extremities and symmetric; no facial droop Skin: Warm and dry Psychiatric: Normal mood and affect   RESULTS  Summary of this visit's results, reviewed and interpreted by myself:   EKG Interpretation  Date/Time:  Thursday April 17 2020 21:06:24 EST Ventricular Rate:  112 PR Interval:  140 QRS Duration: 68 QT Interval:  320 QTC Calculation: 436 R Axis:   1 Text Interpretation: Sinus tachycardia Abnormal ECG Rate is faster Confirmed by Fayetta Sorenson [phone]) on 04/18/2020 1:31:13 AM      Laboratory Studies: Results for orders placed or performed during the hospital encounter of 04/17/20 (from the past 24 hour(s))  Basic metabolic panel     Status: Abnormal   Collection Time: 04/17/20  9:07 PM  Result Value Ref Range   Sodium 139 135 - 145 mmol/L   Potassium 3.5 3.5 - 5.1 mmol/L   Chloride 107 98 - 111 mmol/L   CO2 25 22 - 32 mmol/L   Glucose, Bld 107 (H) 70 - 99 mg/dL   BUN 11 6 - 20 mg/dL   Creatinine, Ser 0.79 0.44 - 1.00 mg/dL   Calcium 9.0 8.9 - 10.3 mg/dL   GFR, Estimated >60 >60 mL/min   Anion gap 7 5 - 15  CBC     Status: Abnormal   Collection Time: 04/17/20  9:07 PM  Result Value Ref Range   WBC 7.4 4.0 - 10.5 K/uL   RBC 4.47 3.87 - 5.11 MIL/uL   Hemoglobin 10.7 (L) 12.0 - 15.0 g/dL   HCT 35.4 (L) 36.0 - 46.0 %   MCV 79.2 (L) 80.0 - 100.0 fL   MCH 23.9 (L) 26.0 - 34.0 pg   MCHC 30.2 30.0 - 36.0 g/dL   RDW 18.4 (H) 11.5 - 15.5 %   Platelets 318 150 - 400 K/uL   nRBC 0.0 0.0 - 0.2 %   Troponin I (High Sensitivity)     Status: None   Collection Time: 04/17/20  9:07 PM  Result Value Ref Range   Troponin I (High Sensitivity) <2 <18 ng/L  Troponin I (High Sensitivity)     Status: None   Collection Time: 04/18/20 12:55 AM  Result Value Ref Range   Troponin I (High Sensitivity) <2 <18 ng/L  Pregnancy, urine     Status: None   Collection Time: 04/18/20  1:52 AM  Result Value Ref Range   Preg Test, Ur NEGATIVE NEGATIVE  Urinalysis, Routine w reflex microscopic Urine, Clean  Catch     Status: Abnormal   Collection Time: 04/18/20  1:52 AM  Result Value Ref Range   Color, Urine YELLOW YELLOW   APPearance CLOUDY (A) CLEAR   Specific Gravity, Urine 1.025 1.005 - 1.030   pH 6.5 5.0 - 8.0   Glucose, UA NEGATIVE NEGATIVE mg/dL   Hgb urine dipstick LARGE (A) NEGATIVE   Bilirubin Urine NEGATIVE NEGATIVE   Ketones, ur NEGATIVE NEGATIVE mg/dL   Protein, ur NEGATIVE NEGATIVE mg/dL   Nitrite NEGATIVE NEGATIVE   Leukocytes,Ua MODERATE (A) NEGATIVE  Urinalysis, Microscopic (reflex)     Status: Abnormal   Collection Time: 04/18/20  1:52 AM  Result Value Ref Range   RBC / HPF >50 0 - 5 RBC/hpf   WBC, UA 6-10 0 - 5 WBC/hpf   Bacteria, UA FEW (A) NONE SEEN   Squamous Epithelial / LPF 0-5 0 - 5  CBC     Status: Abnormal   Collection Time: 04/18/20  3:13 AM  Result Value Ref Range   WBC 7.4 4.0 - 10.5 K/uL   RBC 3.83 (L) 3.87 - 5.11 MIL/uL   Hemoglobin 9.3 (L) 12.0 - 15.0 g/dL   HCT 30.8 (L) 36.0 - 46.0 %   MCV 80.4 80.0 - 100.0 fL   MCH 24.3 (L) 26.0 - 34.0 pg   MCHC 30.2 30.0 - 36.0 g/dL   RDW 18.4 (H) 11.5 - 15.5 %   Platelets 284 150 - 400 K/uL   nRBC 0.0 0.0 - 0.2 %   Imaging Studies: DG Chest 2 View  Result Date: 04/17/2020 CLINICAL DATA:  Chest pain. EXAM: CHEST - 2 VIEW COMPARISON:  01/06/2020 FINDINGS: The previously demonstrated airspace opacities in the left lung have resolved. There is no focal infiltrate on today's exam. No pneumothorax or large pleural effusion.  The trachea is midline. The heart size is unremarkable. There is no acute osseous abnormality. IMPRESSION: No active cardiopulmonary disease. Resolved airspace opacities in the left lung. Electronically Signed   By: Constance Holster M.D.   On: 04/17/2020 21:34    ED COURSE and MDM  Nursing notes, initial and subsequent vitals signs, including pulse oximetry, reviewed and interpreted by myself.  Vitals:   04/17/20 2100 04/17/20 2103 04/18/20 0030 04/18/20 0245  BP:  (!) 141/92 127/85 125/84  Pulse:  (!) 114 (!) 108 90  Resp:  20 18 (!) 22  Temp:  99 F (37.2 C) 99 F (37.2 C) 99 F (37.2 C)  TempSrc:  Oral Oral Oral  SpO2:  100% 100% 100%  Weight: 87.2 kg     Height: 5\' 4"  (1.626 m)      Medications  sodium chloride 0.9 % bolus 1,000 mL ( Intravenous Stopped 04/18/20 0305)   3:51 AM Hemoglobin dropped from 10.7-9.3 after 1 L IV fluid bolus.  Tachycardia has resolved.  Tachycardia likely due to acute blood loss. The patient's OB/GYN has called in a prescription for Provera which she will pick up later today to help control her abnormal bleeding.   PROCEDURES  Procedures   ED DIAGNOSES     ICD-10-CM   1. Menorrhagia with regular cycle  N92.0   2. Acute on chronic blood loss anemia  D62        Envy Meno, MD 04/18/20 [phone]

## 2020-04-25 MED FILL — MEDROXYPROGESTERONE ACETATE: 10 | 10 days supply | Qty: 10 | Fill #0

## 2020-04-30 ENCOUNTER — Encounter: Payer: Self-pay | Admitting: Nurse Practitioner

## 2020-04-30 ENCOUNTER — Ambulatory Visit: Payer: Self-pay | Attending: Nurse Practitioner | Admitting: Nurse Practitioner

## 2020-04-30 ENCOUNTER — Other Ambulatory Visit: Payer: Self-pay

## 2020-04-30 DIAGNOSIS — D649 Anemia, unspecified: Secondary | ICD-10-CM

## 2020-04-30 DIAGNOSIS — K5900 Constipation, unspecified: Secondary | ICD-10-CM

## 2020-04-30 DIAGNOSIS — I10 Essential (primary) hypertension: Secondary | ICD-10-CM

## 2020-04-30 MED ORDER — AMLODIPINE BESYLATE 5 MG PO TABS: 5.0000 mg | ORAL_TABLET | Freq: Every day | ORAL | 1 refills | Status: DC

## 2020-04-30 MED ORDER — SENNOSIDES-DOCUSATE SODIUM 8.6-50 MG PO TABS: 2.0000 | ORAL_TABLET | Freq: Every day | ORAL | 3 refills | Status: DC

## 2020-04-30 MED ORDER — FERROUS GLUCONATE 324 (38 FE) MG PO TABS: 324.0000 mg | ORAL_TABLET | Freq: Every day | ORAL | 3 refills | Status: DC

## 2020-04-30 NOTE — Progress Notes (Signed)
Virtual Visit via Telephone Note Due to national recommendations of social distancing due to [place] 19, telehealth visit is felt to be most appropriate for this patient at this time.  I discussed the limitations, risks, security and privacy concerns of performing an evaluation and management service by telephone and the availability of in person appointments. I also discussed with the patient that there may be a patient responsible charge related to this service. The patient expressed understanding and agreed to proceed.    I connected with Teresa Hudson on 04/30/20  at  10:30 AM EST  EDT by telephone and verified that I am speaking with the correct person using two identifiers.   Consent I discussed the limitations, risks, security and privacy concerns of performing an evaluation and management service by telephone and the availability of in person appointments. I also discussed with the patient that there may be a patient responsible charge related to this service. The patient expressed understanding and agreed to proceed.   Location of Patient: Private Residence    Location of Provider: [place] [facility] [employer] Office    Persons participating in Telemedicine visit: [name] FNP-BC [place]    History of Present Illness: Telemedicine visit for: Follow up She has a PMH of HTN, Anemia due to menorrhagia/fibroids  ESSENTIAL HYPERTENSION Currently taking amlodipine 5 mg daily as prescribed.  Blood pressure is well controlled.  Denies chest pain, shortness of breath, palpitations, lightheadedness, dizziness, headaches or BLE edema.  BP Readings from Last 3 Encounters:  04/18/20 128/88  02/01/20 134/87  01/30/20 129/86   Anemia  She is currently taking ferrous sulfate which is causing worsening constipation.  She states that she was taking fergon in the past and her constipation was not as severe. Will refill fergon today and dc ferrous sulfate.     She is scheduled to receive the Moderna vaccine this afternoon.  She is concerned regarding her history of palpitations (which is actually related to her anemia) and receiving the Moderna vaccine. I have instructed her that although I can not give recommendations for Pfizer vs Moderna I feel both would be safe for her to take depending on which one she chooses.  Past Medical History:  Diagnosis Date  . Anemia 1997  . Chlamydia   . Fibroids   . Hypertension 2012 dx  . LSIL (low grade squamous intraepithelial lesion) on Pap smear 01/24/2012   Colpo 01/24/12    Past Surgical History:  Procedure Laterality Date  . wisdom tooth removal      Family History  Problem Relation Age of Onset  . Diabetes Mother   . Breast cancer Mother 89  . Cancer Father        prostate  . Hypertension Father   . Asthma Sister   . GI problems Sister   . Thyroid cancer Sister     Social History   Socioeconomic History  . Marital status: Single    Spouse name: Not on file  . Number of children: 3  . Years of education: Not on file  . Highest education level: Some college, no degree  Occupational History  . Not on file  Tobacco Use  . Smoking status: Never Smoker  . Smokeless tobacco: Never Used  Vaping Use  . Vaping Use: Never used  Substance and Sexual Activity  . Alcohol use: Yes    Comment: ocassionally   . Drug use: No  . Sexual activity: Not Currently  Birth control/protection: Condom  Other Topics Concern  . Not on file  Social History Narrative  . Not on file   Social Determinants of Health   Financial Resource Strain: Not on file  Food Insecurity: No Food Insecurity  . Worried About Charity fundraiser in the Last Year: Never true  . Ran Out of Food in the Last Year: Never true  Transportation Needs: No Transportation Needs  . Lack of Transportation (Medical): No  . Lack of Transportation (Non-Medical): No  Physical Activity: Not on file  Stress: Not on file  Social  Connections: Not on file     Observations/Objective: Awake, alert and oriented x 3   Review of Systems  Constitutional: Negative for fever, malaise/fatigue and weight loss.  HENT: Negative.  Negative for nosebleeds.   Eyes: Negative.  Negative for blurred vision, double vision and photophobia.  Respiratory: Negative.  Negative for cough and shortness of breath.   Cardiovascular: Negative.  Negative for chest pain, palpitations and leg swelling.  Gastrointestinal: Positive for constipation. Negative for heartburn, nausea and vomiting.  Musculoskeletal: Negative.  Negative for myalgias.  Neurological: Negative.  Negative for dizziness, focal weakness, seizures and headaches.  Psychiatric/Behavioral: Negative.  Negative for suicidal ideas.    Assessment and Plan: Teresa Hudson was seen today for follow-up.  Diagnoses and all orders for this visit:  Essential hypertension -     amLODipine (NORVASC) 5 MG tablet; Take 1 tablet (5 mg total) by mouth daily.  Constipation, unspecified constipation type -     senna-docusate (SENOKOT-S) 8.6-50 MG tablet; Take 2 tablets by mouth daily.  Symptomatic anemia -     ferrous gluconate (FERGON) 324 MG tablet; Take 1 tablet (324 mg total) by mouth daily with breakfast.     Follow Up Instructions Return in about 3 months (around 07/29/2020).     I discussed the assessment and treatment plan with the patient. The patient was provided an opportunity to ask questions and all were answered. The patient agreed with the plan and demonstrated an understanding of the instructions.   The patient was advised to call back or seek an in-person evaluation if the symptoms worsen or if the condition fails to improve as anticipated.  I provided 16 minutes of non-face-to-face time during this encounter including median intraservice time, reviewing previous notes, labs, imaging, medications and explaining diagnosis and management.  Gildardo Pounds, FNP-BC

## 2020-05-02 ENCOUNTER — Other Ambulatory Visit: Payer: Self-pay | Admitting: Nurse Practitioner

## 2020-05-02 DIAGNOSIS — D649 Anemia, unspecified: Secondary | ICD-10-CM

## 2020-05-02 DIAGNOSIS — I10 Essential (primary) hypertension: Secondary | ICD-10-CM

## 2020-05-02 DIAGNOSIS — L700 Acne vulgaris: Secondary | ICD-10-CM

## 2020-05-02 DIAGNOSIS — K5900 Constipation, unspecified: Secondary | ICD-10-CM

## 2020-05-02 MED ORDER — SENNOSIDES-DOCUSATE SODIUM 8.6-50 MG PO TABS: 2.0000 | ORAL_TABLET | Freq: Every day | ORAL | 3 refills | Status: AC

## 2020-05-02 MED ORDER — FERROUS GLUCONATE 324 (38 FE) MG PO TABS: 324.0000 mg | ORAL_TABLET | Freq: Every day | ORAL | 3 refills | Status: DC

## 2020-05-02 MED ORDER — AMLODIPINE BESYLATE 5 MG PO TABS: 5.0000 mg | ORAL_TABLET | Freq: Every day | ORAL | 1 refills | Status: DC

## 2020-05-02 MED ORDER — CLINDAMYCIN PHOS-BENZOYL PEROX 1-5 % EX GEL: Freq: Two times a day (BID) | CUTANEOUS | 1 refills | Status: DC

## 2020-05-06 MED FILL — AMLODIPINE BESYLATE 5 MG TA: 5 | 30 days supply | Qty: 30 | Fill #0

## 2020-05-07 MED FILL — CLINDAMYCIN PHOS-BENZOYL PE: 1-5 | 30 days supply | Qty: 50 | Fill #0

## 2020-05-12 ENCOUNTER — Other Ambulatory Visit: Payer: Self-pay

## 2020-05-12 ENCOUNTER — Ambulatory Visit (INDEPENDENT_AMBULATORY_CARE_PROVIDER_SITE_OTHER): Payer: Self-pay | Admitting: Family Medicine

## 2020-05-12 ENCOUNTER — Encounter: Payer: Self-pay | Admitting: Family Medicine

## 2020-05-12 VITALS — BP 132/92 | HR 92 | Wt 186.5 lb

## 2020-05-12 DIAGNOSIS — D649 Anemia, unspecified: Secondary | ICD-10-CM

## 2020-05-12 DIAGNOSIS — N920 Excessive and frequent menstruation with regular cycle: Secondary | ICD-10-CM

## 2020-05-12 DIAGNOSIS — D219 Benign neoplasm of connective and other soft tissue, unspecified: Secondary | ICD-10-CM

## 2020-05-12 MED ORDER — NORETHINDRONE ACETATE 5 MG PO TABS: 10.0000 mg | ORAL_TABLET | Freq: Every day | ORAL | 1 refills | Status: DC

## 2020-05-12 MED ORDER — NORETHINDRONE ACETATE 5 MG PO TABS
10.0000 mg | ORAL_TABLET | Freq: Every day | ORAL | 1 refills | Status: DC
Start: 2020-05-12 — End: 2020-05-12

## 2020-05-12 NOTE — Patient Instructions (Signed)
Hysterectomy Information  A hysterectomy is a surgery to remove your uterus. After surgery, you will no longer have periods. Also, you will no longer be able to get pregnant. Reasons for this surgery You may have this surgery if:  You have bleeding in your vagina: ? That is not normal. ? That does not stop, or that keeps coming back.  You have long-term (chronic) pain in your lower belly (pelvic area).  The lining of your uterus grows outside of the uterus (endometriosis).  The lining of your uterus grows in the muscle of the uterus (adenomyosis).  Your uterus falls down into your vagina (prolapse).  You have a growth in your uterus that causes problems (uterine fibroids).  You have cells that could turn into cancer (precancerous cells).  You have cancer of the uterus or cervix. Types of hysterectomies There are 3 types of hysterectomies. Depending on the type, the surgery will:  Remove the top part of the uterus (supracervical).  Remove the uterus and the cervix (total).  Remove the uterus, cervix, and tissue that holds the uterus in place (radical). Ways a hysterectomy can be done This surgery may be done in one of these ways:  A cut (incision) is made in the belly (abdomen). The uterus is taken out through the cut.  A cut is made in the vagina. The uterus is taken out through the cut.  Three or four cuts are made in the belly. A device with a camera is put through one of the cuts. The uterus is cut into pieces and taken out through the cuts or the vagina.  Three or four cuts are made in the belly. A device with a camera is put through one of the cuts. The uterus is taken out through the vagina.  Three or four cuts are made in the belly. A computer helps control the surgical tools. The uterus is cut into small pieces. The pieces are taken out through the cuts or through the vagina. Talk with your doctor about which way is best for you. Risks of hysterectomy Generally,  this surgery is safe. However, problems can happen, including:  Bleeding.  Needing donated blood (transfusion).  Blood clots.  Infection.  Damage to other structures or organs.  Allergic reactions.  Needing to switch to a different type of surgery. What to expect after surgery  You will be given pain medicine.  You will need to stay in the hospital for 1-2 days.  Follow your doctor's instructions about: ? Exercising. ? Driving. ? What activities are safe for you.  You will need to have someone with you at home for 3-5 days.  You will need to see your doctor after 2-4 weeks.  You may get hot flashes, have night sweats, and have trouble sleeping.  You may need to have Pap tests if your surgery was related to cancer. Talk with your doctor about how often you need Pap tests. Questions to ask your doctor  Do I need this surgery? Do I have other treatment options?  What are my options for this surgery?  What needs to be removed?  What are the risks?  What are the benefits?  How long will I need to stay in the hospital?  How long will I need to recover?  What symptoms can I expect after the procedure? Summary  A hysterectomy is a surgery to remove your uterus. After surgery, you will no longer have periods. Also, you will no longer be able to   get pregnant.  Talk with your doctor about which type of hysterectomy is best for you. This information is not intended to replace advice given to you by your health care provider. Make sure you discuss any questions you have with your health care provider. Document Revised: 06/29/2018 Document Reviewed: 07/27/2016 Elsevier Patient Education  2020 Elsevier Inc.  

## 2020-05-12 NOTE — Assessment & Plan Note (Signed)
S/p IUD and expulsion. Difficulty with medications needed daily--she has no insurance, but bleeding is interfering with her ADLs and has needed admission and transfusion in the past. Will apply for charity care and tentatively booked for [facility] with bilateral salpingecotmy. Other options including UFE (unavailable w/o insurance), Milinda Pointer (also difficult w/o insurance), Sonata (not trained at present), BTL with [facility], and possible failure rate of this, she desires definitive treatment. Risks include but are not limited to bleeding, infection, injury to surrounding structures, including bowel, bladder and ureters, blood clots, and death.  Likelihood of success is high.

## 2020-05-12 NOTE — Assessment & Plan Note (Signed)
Continue po iron repletion

## 2020-05-12 NOTE — Progress Notes (Signed)
   Subjective:    Patient ID: Teresa Hudson is a 45 y.o. female presenting with Abnormal Uterine Bleeding  on 05/12/2020  HPI: Has h/o abnormal uterine bleeding. Previously with transfusion due to hgb of 4.6. On megace which caused weight gain. Placed IUD in 6/21 and she passed it and is bleeding again. In ED Hgb noted to be 9.3 3 weeks ago. U/S reveals fibroids, including one that is submucosal. Negative EMB 09/2019. H/o SVD x 3. Still bleeding and it is heavy.  Review of Systems  Constitutional: Negative for chills and fever.  Respiratory: Negative for shortness of breath.   Cardiovascular: Negative for chest pain.  Gastrointestinal: Negative for abdominal pain, nausea and vomiting.  Genitourinary: Negative for dysuria.  Skin: Negative for rash.      Objective:    BP (!) 132/92   Pulse 92   Wt 186 lb 8 oz (84.6 kg)   LMP 05/10/2020 (Exact Date)   BMI 32.01 kg/m  Physical Exam Constitutional:      General: She is not in acute distress.    Appearance: She is well-developed and well-nourished.  HENT:     Head: Normocephalic and atraumatic.  Eyes:     General: No scleral icterus. Cardiovascular:     Rate and Rhythm: Normal rate.  Pulmonary:     Effort: Pulmonary effort is normal.  Abdominal:     Palpations: Abdomen is soft.  Musculoskeletal:     Cervical back: Neck supple.  Skin:    General: Skin is warm and dry.  Neurological:     Mental Status: She is alert and oriented to person, place, and time.  Psychiatric:        Mood and Affect: Mood and affect normal.    Pelvic sonogram shows 12 week size uterus with multiple fibroids.     Assessment & Plan:   Problem List Items Addressed This Visit      Unprioritized   Symptomatic anemia    Continue po iron repletion      Menorrhagia - Primary    Start Norethrindrone with cycles take up to 3 daily if needed.      Relevant Medications   norethindrone (AYGESTIN) 5 MG tablet   Fibroids    S/p IUD and  expulsion. Difficulty with medications needed daily--she has no insurance, but bleeding is interfering with her ADLs and has needed admission and transfusion in the past. Will apply for charity care and tentatively booked for [facility] with bilateral salpingecotmy. Other options including UFE (unavailable w/o insurance), Milinda Pointer (also difficult w/o insurance), Sonata (not trained at present), BTL with [facility], and possible failure rate of this, she desires definitive treatment. Risks include but are not limited to bleeding, infection, injury to surrounding structures, including bowel, bladder and ureters, blood clots, and death.  Likelihood of success is high.       Relevant Medications   norethindrone (AYGESTIN) 5 MG tablet      Total time in review of prior notes, pathology, labs, history taking, review with patient, exam, note writing, discussion of options, plan for next steps, alternatives and risks of treatment: 43 minutes.  Return in about 4 weeks (around 06/09/2020) for charity care application, virtual.  Reva Bores 05/12/2020 11:52 AM

## 2020-05-12 NOTE — Assessment & Plan Note (Signed)
Start Norethrindrone with cycles take up to 3 daily if needed.

## 2020-05-19 ENCOUNTER — Telehealth: Payer: Self-pay | Admitting: Lactation Services

## 2020-05-19 NOTE — Telephone Encounter (Signed)
Called patient to inform her that she needs to come in her Hysterectomy Statement. Patient reports she can come by in the next week to sign. Patient reports they thought her surgery would be in March. Reviewed her appointments.   Reviewed with patient that when she comes in to sigh hysterectomy statement to pick up [place] and discuss with front desk what paperwork is needed to turn in with that application. Reviewed she will have to bring documents for the [place] for it to be complete and to be processed. Patient voiced understanding.

## 2020-05-19 NOTE — Telephone Encounter (Signed)
-----   Message from Francia Greaves sent at 05/19/2020  1:44 PM EST ----- Regarding: NEEDS HYSTERECTOMY STATEMENT - SURGERY 06/10/2020

## 2020-05-30 NOTE — Progress Notes (Unsigned)
Patient came into front office 05/29/20 and signed hysterectomy statement with Apolonio Schneiders, RN.

## 2020-05-30 NOTE — H&P (Signed)
  Teresa Hudson is an 45 y.o. [phone] female.   Chief Complaint: abnormal bleeding HPI: Has h/o abnormal uterine bleeding. Previously with transfusion due to hgb of 4.6. On megace which caused weight gain. Placed IUD in 6/21 and she passed it and is bleeding again. In ED Hgb noted to be 9.3 3 weeks ago. U/S reveals fibroids, including one that is submucosal. Negative EMB 09/2019. H/o SVD x 3. Still bleeding and it is heavy.  Past Medical History:  Diagnosis Date  . Anemia 1997  . Chlamydia   . Fibroids   . Hypertension 2012 dx  . LSIL (low grade squamous intraepithelial lesion) on Pap smear 01/24/2012   Colpo 01/24/12    Past Surgical History:  Procedure Laterality Date  . wisdom tooth removal      Family History  Problem Relation Age of Onset  . Diabetes Mother   . Breast cancer Mother 56  . Cancer Father        prostate  . Hypertension Father   . Asthma Sister   . GI problems Sister   . Thyroid cancer Sister    Social History:  reports that she has never smoked. She has never used smokeless tobacco. She reports current alcohol use. She reports that she does not use drugs.  Allergies:  Allergies  Allergen Reactions  . Sulfa Antibiotics Anaphylaxis  . Aspirin Swelling  . Latex Itching and Swelling  . Penicillins Swelling    Facial swelling Reaction: Over 20 years ago  . Nickel Rash    No medications prior to admission.    A comprehensive review of systems was negative.  Last menstrual period 05/10/2020. General appearance: alert, cooperative and appears stated age Head: Normocephalic, without obvious abnormality, atraumatic Neck: supple, symmetrical, trachea midline Lungs: normal effort Heart: regular rate and rhythm Abdomen: soft, non-tender; bowel sounds normal; no masses,  no organomegaly Extremities: extremities normal, atraumatic, no cyanosis or edema Skin: Skin color, texture, turgor normal. No rashes or lesions Neurologic: Grossly normal   Lab  Results  Component Value Date   WBC 7.4 04/18/2020   HGB 9.3 (L) 04/18/2020   HCT 30.8 (L) 04/18/2020   MCV 80.4 04/18/2020   PLT 284 04/18/2020   Lab Results  Component Value Date   PREGTESTUR NEGATIVE 04/18/2020     Assessment/Plan Active Problems:   Menorrhagia   Fibroids  For [facility] with bilateral salpingectomy Risks include but are not limited to bleeding, infection, injury to surrounding structures, including bowel, bladder and ureters, blood clots, and death.  Likelihood of success is high.    [name] 05/30/2020, 9:05 AM

## 2020-06-02 ENCOUNTER — Encounter (HOSPITAL_COMMUNITY): Admission: RE | Admit: 2020-06-02 | Payer: Self-pay | Source: Ambulatory Visit

## 2020-06-02 DIAGNOSIS — B9689 Other specified bacterial agents as the cause of diseases classified elsewhere: Secondary | ICD-10-CM

## 2020-06-02 DIAGNOSIS — N760 Acute vaginitis: Secondary | ICD-10-CM

## 2020-06-04 ENCOUNTER — Encounter (HOSPITAL_BASED_OUTPATIENT_CLINIC_OR_DEPARTMENT_OTHER): Payer: Self-pay | Admitting: Family Medicine

## 2020-06-04 ENCOUNTER — Other Ambulatory Visit: Payer: Self-pay

## 2020-06-04 NOTE — Progress Notes (Addendum)
Spoke w/ via phone for pre-op interview---pt  Lab needs dos---- urine poct              Lab results------ekg 04-17-2020 epic, has lab appt 04-06-2021 cbc with dif, bmet, T & S COVID test ------04-06-2021 1045 Arrive at -------1245 pm NPO after MN NO Solid Food.  Clear liquids from MN until---1145 am then npo Medications to take morning of surgery -----none Diabetic medication -----n/a Patient Special Instructions -----none Pre-Op special Istructions -----pt given op with extended stay instructions Patient verbalized understanding of instructions that were given at this phone interview. Patient denies shortness of breath, chest pain, fever, cough at this phone interview.

## 2020-06-04 NOTE — Progress Notes (Signed)
YOU ARE SCHEDULED FOR A COVID TEST 06-06-2020 @1045  AM_. THIS TEST MUST BE DONE BEFORE SURGERY. GO TO  [place]. [place], [place], IT IS APPROXIMATELY 2 MINUTES PAST [employer] ON THE RIGHT AND REMAIN IN YOUR CAR, THIS IS A DRIVE UP TEST. ONCE YOUR COVID TEST IS DONE PLEASE FOLLOW ALL THE QUARANTINE  INSTRUCTIONS GIVEN IN YOUR HANDOUT.      Your procedure is scheduled on 06-10-2020  Report to [place] [facility].   Call this number if you have problems the morning of surgery  [phone].   OUR ADDRESS IS [place].  WE ARE LOCATED IN [facility].  PLEASE BRING YOUR INSURANCE CARD AND PHOTO ID DAY OF SURGERY.  ONLY ONE PERSON ALLOWED IN FACILITY WAITING AREA.                                     REMEMBER:  DO NOT EAT FOOD, CANDY GUM OR MINTS  AFTER MIDNIGHT . YOU MAY HAVE CLEAR LIQUIDS FROM MIDNIGHT UNTIL 1145 AM  . NO CLEAR LIQUIDS AFTER 1145 AM  DAY OF SURGERY.   YOU MAY  BRUSH YOUR TEETH MORNING OF SURGERY AND RINSE YOUR MOUTH OUT, NO CHEWING GUM CANDY OR MINTS.    CLEAR LIQUID DIET   Foods Allowed                                                                     Foods Excluded  Coffee and tea, regular and decaf                             liquids that you cannot  Plain Jell-O any favor except red or purple                                           see through such as: Fruit ices (not with fruit pulp)                                     milk, soups, orange juice  Iced Popsicles                                    All solid food Carbonated beverages, regular and diet                                    Cranberry, grape and apple juices Sports drinks like Gatorade Lightly seasoned clear broth or consume(fat free) Sugar, honey syrup  Sample Menu Breakfast                                Lunch  Supper Cranberry juice                    Beef broth                            Chicken  broth Jell-O                                     Grape juice                           Apple juice Coffee or tea                        Jell-O                                      Popsicle                                                Coffee or tea                        Coffee or tea  _____________________________________________________________________     TAKE THESE MEDICATIONS MORNING OF SURGERY WITH A SIP OF WATER:  NONE  ONE VISITOR IS ALLOWED IN WAITING ROOM ONLY DAY OF SURGERY.  NO VISITOR MAY SPEND THE NIGHT.  VISITOR ARE ALLOWED TO STAY UNTIL 800 PM.                                    DO NOT WEAR JEWERLY, MAKE UP, OR NAIL POLISH ON FINGERNAILS. DO NOT WEAR LOTIONS, POWDERS, PERFUMES OR DEODORANT. DO NOT SHAVE FOR 24 HOURS PRIOR TO DAY OF SURGERY. MEN MAY SHAVE FACE AND NECK. CONTACTS, GLASSES, OR DENTURES MAY NOT BE WORN TO SURGERY.                                    [facility] IS NOT RESPONSIBLE  FOR ANY BELONGINGS.                                                                    Marland Kitchen           [place] - Preparing for Surgery Before surgery, you can play an important role.  Because skin is not sterile, your skin needs to be as free of germs as possible.  You can reduce the number of germs on your skin by washing with CHG (chlorahexidine gluconate) soap before surgery.  CHG is an antiseptic cleaner which kills germs and bonds with the skin to continue killing germs even after washing. Please DO NOT use if you have an allergy to CHG or antibacterial soaps.  If  your skin becomes reddened/irritated stop using the CHG and inform your nurse when you arrive at Short Stay. Do not shave (including legs and underarms) for at least 48 hours prior to the first CHG shower.  You may shave your face/neck. Please follow these instructions carefully:  1.  Shower with CHG Soap the night before surgery and the  morning of Surgery.  2.  If you choose to wash your hair, wash your hair first as  usual with your  normal  shampoo.  3.  After you shampoo, rinse your hair and body thoroughly to remove the  shampoo.                           4.  Use CHG as you would any other liquid soap.  You can apply chg directly  to the skin and wash                       Gently with a scrungie or clean washcloth.  5.  Apply the CHG Soap to your body ONLY FROM THE NECK DOWN.   Do not use on face/ open                           Wound or open sores. Avoid contact with eyes, ears mouth and genitals (private parts).                       Wash face,  Genitals (private parts) with your normal soap.             6.  Wash thoroughly, paying special attention to the area where your surgery  will be performed.  7.  Thoroughly rinse your body with warm water from the neck down.  8.  DO NOT shower/wash with your normal soap after using and rinsing off  the CHG Soap.                9.  Pat yourself dry with a clean towel.            10.  Wear clean pajamas.            11.  Place clean sheets on your bed the night of your first shower and do not  sleep with pets. Day of Surgery : Do not apply any lotions/deodorants the morning of surgery.  Please wear clean clothes to the hospital/surgery center.  FAILURE TO FOLLOW THESE INSTRUCTIONS MAY RESULT IN THE CANCELLATION OF YOUR SURGERY PATIENT SIGNATURE_________________________________  NURSE SIGNATURE__________________________________  ________________________________________________________________________                           QUESTIONS Hansel Feinstein PRE OP [id number] [phone]

## 2020-06-06 ENCOUNTER — Encounter (HOSPITAL_COMMUNITY)
Admission: RE | Admit: 2020-06-06 | Discharge: 2020-06-06 | Disposition: A | Discharge status: Home / Self Care | Payer: Self-pay | Source: Ambulatory Visit | Attending: Family Medicine | Admitting: Family Medicine

## 2020-06-06 ENCOUNTER — Other Ambulatory Visit: Payer: Self-pay

## 2020-06-06 ENCOUNTER — Other Ambulatory Visit (HOSPITAL_COMMUNITY)
Admission: RE | Admit: 2020-06-06 | Discharge: 2020-06-06 | Disposition: A | Discharge status: Home / Self Care | Payer: Self-pay | Source: Ambulatory Visit | Attending: Family Medicine | Admitting: Family Medicine

## 2020-06-06 DIAGNOSIS — Z01812 Encounter for preprocedural laboratory examination: Secondary | ICD-10-CM | POA: Insufficient documentation

## 2020-06-06 DIAGNOSIS — Z20822 Contact with and (suspected) exposure to covid-19: Secondary | ICD-10-CM | POA: Insufficient documentation

## 2020-06-06 LAB — BASIC METABOLIC PANEL
Anion gap: 9 (ref 5–15)
BUN: 11 mg/dL (ref 6–20)
CO2: 25 mmol/L (ref 22–32)
Calcium: 9.1 mg/dL (ref 8.9–10.3)
Chloride: 105 mmol/L (ref 98–111)
Creatinine, Ser: 0.61 mg/dL (ref 0.44–1.00)
GFR, Estimated: >60 mL/min (ref >60)
Glucose, Bld: 102 mg/dL — ABNORMAL HIGH (ref 70–99)
Potassium: 3.5 mmol/L (ref 3.5–5.1)
Sodium: 139 mmol/L (ref 135–145)

## 2020-06-06 LAB — CBC WITH DIFFERENTIAL/PLATELET
Abs Immature Granulocytes: 0.01 10*3/uL (ref 0.00–0.07)
Basophils Absolute: 0 10*3/uL (ref 0.0–0.1)
Basophils Relative: 1 %
Eosinophils Absolute: 0.1 10*3/uL (ref 0.0–0.5)
Eosinophils Relative: 2 %
HCT: 39.6 % (ref 36.0–46.0)
Hemoglobin: 12.2 g/dL (ref 12.0–15.0)
Immature Granulocytes: 0 %
Lymphocytes Relative: 30 %
Lymphs Abs: 2.1 10*3/uL (ref 0.7–4.0)
MCH: 25.3 pg — ABNORMAL LOW (ref 26.0–34.0)
MCHC: 30.8 g/dL (ref 30.0–36.0)
MCV: 82.2 fL (ref 80.0–100.0)
Monocytes Absolute: 0.8 10*3/uL (ref 0.1–1.0)
Monocytes Relative: 11 %
Neutro Abs: 4.1 10*3/uL (ref 1.7–7.7)
Neutrophils Relative %: 56 %
Platelets: 325 10*3/uL (ref 150–400)
RBC: 4.82 MIL/uL (ref 3.87–5.11)
RDW: 17.1 % — ABNORMAL HIGH (ref 11.5–15.5)
WBC: 7.2 10*3/uL (ref 4.0–10.5)
nRBC: 0 % (ref 0.0–0.2)

## 2020-06-06 LAB — SARS CORONAVIRUS 2 (TAT 6-24 HRS): SARS Coronavirus 2: NEGATIVE

## 2020-06-06 LAB — TYPE AND SCREEN
ABO/RH(D): A POS
Antibody Screen: POSITIVE

## 2020-06-06 MED ORDER — METRONIDAZOLE 500 MG PO TABS: 500.0000 mg | ORAL_TABLET | Freq: Two times a day (BID) | ORAL | 0 refills | Status: DC

## 2020-06-06 NOTE — Progress Notes (Signed)
Received call from [facility], spoke w/ [place], stated pt has positive antibody will need T&S done again day of surgery.  Entered order in epic.

## 2020-06-09 NOTE — Progress Notes (Signed)
Called and spoke w/ pt via phone. Informed pt that her procedure start time has changed to 1300.  Pt verbalized understanding to arrive  1100 at [facility] and her clear liquids are from midnight tonight to 1000.

## 2020-06-09 NOTE — Anesthesia Preprocedure Evaluation (Addendum)
Anesthesia Evaluation  Patient identified by MRN, date of birth, ID band Patient awake    Reviewed: Allergy & Precautions, NPO status , Patient's Chart, lab work & pertinent test results  Airway Mallampati: II  TM Distance: >3 FB Neck ROM: Full    Dental no notable dental hx. (+) Teeth Intact, Dental Advisory Given,    Pulmonary    Pulmonary exam normal breath sounds clear to auscultation       Cardiovascular hypertension, Pt. on medications Normal cardiovascular exam Rhythm:Regular Rate:Normal     Neuro/Psych negative neurological ROS     GI/Hepatic Neg liver ROS, GERD  ,  Endo/Other  negative endocrine ROS  Renal/GU K+ 3.5 Cr 0.61     Musculoskeletal negative musculoskeletal ROS (+)   Abdominal (+) + obese,   Peds  Hematology Hgb12.2 Plt 325   Anesthesia Other Findings All: latex, sulfa, asa  Reproductive/Obstetrics                           Anesthesia Physical Anesthesia Plan  ASA: II  Anesthesia Plan: General   Post-op Pain Management:    Induction: Intravenous  PONV Risk Score and Plan: 4 or greater and Treatment may vary due to age or medical condition, Ondansetron, Dexamethasone and Midazolam  Airway Management Planned: Oral ETT  Additional Equipment: None  Intra-op Plan:   Post-operative Plan: Extubation in OR  Informed Consent: I have reviewed the patients History and Physical, chart, labs and discussed the procedure including the risks, benefits and alternatives for the proposed anesthesia with the patient or authorized representative who has indicated his/her understanding and acceptance.     Dental advisory given  Plan Discussed with: CRNA and Anesthesiologist  Anesthesia Plan Comments:        Anesthesia Quick Evaluation

## 2020-06-10 ENCOUNTER — Other Ambulatory Visit: Payer: Self-pay

## 2020-06-10 ENCOUNTER — Ambulatory Visit (HOSPITAL_BASED_OUTPATIENT_CLINIC_OR_DEPARTMENT_OTHER): Payer: Self-pay | Admitting: Anesthesiology

## 2020-06-10 ENCOUNTER — Encounter (HOSPITAL_BASED_OUTPATIENT_CLINIC_OR_DEPARTMENT_OTHER)
Admission: RE | Disposition: A | Discharge status: Home / Self Care | Payer: Self-pay | Source: Home / Self Care | Attending: Family Medicine

## 2020-06-10 ENCOUNTER — Ambulatory Visit (HOSPITAL_BASED_OUTPATIENT_CLINIC_OR_DEPARTMENT_OTHER)
Admission: RE | Admit: 2020-06-10 | Discharge: 2020-06-11 | Disposition: A | Discharge status: Home / Self Care | Payer: Self-pay | Attending: Family Medicine | Admitting: Family Medicine

## 2020-06-10 ENCOUNTER — Encounter (HOSPITAL_BASED_OUTPATIENT_CLINIC_OR_DEPARTMENT_OTHER): Payer: Self-pay | Admitting: Family Medicine

## 2020-06-10 DIAGNOSIS — N920 Excessive and frequent menstruation with regular cycle: Secondary | ICD-10-CM | POA: Insufficient documentation

## 2020-06-10 DIAGNOSIS — D259 Leiomyoma of uterus, unspecified: Secondary | ICD-10-CM

## 2020-06-10 DIAGNOSIS — Z8379 Family history of other diseases of the digestive system: Secondary | ICD-10-CM | POA: Insufficient documentation

## 2020-06-10 DIAGNOSIS — Z8249 Family history of ischemic heart disease and other diseases of the circulatory system: Secondary | ICD-10-CM | POA: Insufficient documentation

## 2020-06-10 DIAGNOSIS — Z880 Allergy status to penicillin: Secondary | ICD-10-CM | POA: Insufficient documentation

## 2020-06-10 DIAGNOSIS — Z886 Allergy status to analgesic agent status: Secondary | ICD-10-CM | POA: Insufficient documentation

## 2020-06-10 DIAGNOSIS — Z9109 Other allergy status, other than to drugs and biological substances: Secondary | ICD-10-CM | POA: Insufficient documentation

## 2020-06-10 DIAGNOSIS — Z8042 Family history of malignant neoplasm of prostate: Secondary | ICD-10-CM | POA: Insufficient documentation

## 2020-06-10 DIAGNOSIS — N72 Inflammatory disease of cervix uteri: Secondary | ICD-10-CM | POA: Insufficient documentation

## 2020-06-10 DIAGNOSIS — Z808 Family history of malignant neoplasm of other organs or systems: Secondary | ICD-10-CM | POA: Insufficient documentation

## 2020-06-10 DIAGNOSIS — Z79899 Other long term (current) drug therapy: Secondary | ICD-10-CM | POA: Insufficient documentation

## 2020-06-10 DIAGNOSIS — Z91040 Latex allergy status: Secondary | ICD-10-CM | POA: Insufficient documentation

## 2020-06-10 DIAGNOSIS — Z803 Family history of malignant neoplasm of breast: Secondary | ICD-10-CM | POA: Insufficient documentation

## 2020-06-10 DIAGNOSIS — D219 Benign neoplasm of connective and other soft tissue, unspecified: Secondary | ICD-10-CM | POA: Diagnosis present

## 2020-06-10 DIAGNOSIS — Z90710 Acquired absence of both cervix and uterus: Secondary | ICD-10-CM | POA: Diagnosis present

## 2020-06-10 DIAGNOSIS — Z8616 Personal history of COVID-19: Secondary | ICD-10-CM | POA: Insufficient documentation

## 2020-06-10 DIAGNOSIS — Z793 Long term (current) use of hormonal contraceptives: Secondary | ICD-10-CM | POA: Insufficient documentation

## 2020-06-10 DIAGNOSIS — Z833 Family history of diabetes mellitus: Secondary | ICD-10-CM | POA: Insufficient documentation

## 2020-06-10 DIAGNOSIS — Z882 Allergy status to sulfonamides status: Secondary | ICD-10-CM | POA: Insufficient documentation

## 2020-06-10 DIAGNOSIS — D251 Intramural leiomyoma of uterus: Secondary | ICD-10-CM | POA: Insufficient documentation

## 2020-06-10 HISTORY — DX: Excessive and frequent menstruation with regular cycle: N92.0

## 2020-06-10 HISTORY — PX: VAGINAL HYSTERECTOMY: SHX2639

## 2020-06-10 HISTORY — DX: Gastro-esophageal reflux disease without esophagitis: K21.9

## 2020-06-10 SURGERY — HYSTERECTOMY, VAGINAL
Anesthesia: General | Site: Uterus | Laterality: Bilateral

## 2020-06-10 MED ORDER — MIDAZOLAM HCL 2 MG/2ML IJ SOLN
INTRAMUSCULAR | Status: DC | PRN
  Administered 2020-06-10: 2 mg via INTRAVENOUS

## 2020-06-10 MED ORDER — PROPOFOL 10 MG/ML IV BOLUS
INTRAVENOUS | Status: DC | PRN
  Administered 2020-06-10: 160 mg via INTRAVENOUS

## 2020-06-10 MED ORDER — OXYCODONE HCL 5 MG PO TABS: 5.0000 mg | ORAL_TABLET | Freq: Four times a day (QID) | ORAL | 0 refills | Status: DC | PRN

## 2020-06-10 MED ORDER — DOCUSATE SODIUM 100 MG PO CAPS
ORAL_CAPSULE | ORAL | Status: AC
  Filled 2020-06-10: qty 1

## 2020-06-10 MED ORDER — PROMETHAZINE HCL 25 MG/ML IJ SOLN
INTRAMUSCULAR | Status: AC
  Filled 2020-06-10: qty 1

## 2020-06-10 MED ORDER — LACTATED RINGERS IV SOLN: INTRAVENOUS | Status: DC

## 2020-06-10 MED ORDER — PROPOFOL 10 MG/ML IV BOLUS
INTRAVENOUS | Status: AC
  Filled 2020-06-10: qty 20

## 2020-06-10 MED ORDER — ONDANSETRON HCL 4 MG/2ML IJ SOLN
INTRAMUSCULAR | Status: AC
  Filled 2020-06-10: qty 2

## 2020-06-10 MED ORDER — PHENYLEPHRINE 40 MCG/ML (10ML) SYRINGE FOR IV PUSH (FOR BLOOD PRESSURE SUPPORT)
PREFILLED_SYRINGE | INTRAVENOUS | Status: AC
  Filled 2020-06-10: qty 10

## 2020-06-10 MED ORDER — ONDANSETRON HCL 4 MG PO TABS: 4.0000 mg | ORAL_TABLET | Freq: Four times a day (QID) | ORAL | Status: DC | PRN

## 2020-06-10 MED ORDER — ONDANSETRON HCL 4 MG/2ML IJ SOLN
4.0000 mg | Freq: Four times a day (QID) | INTRAMUSCULAR | Status: DC | PRN
  Administered 2020-06-10: 4 mg via INTRAVENOUS

## 2020-06-10 MED ORDER — MENTHOL 3 MG MT LOZG: 1.0000 | LOZENGE | OROMUCOSAL | Status: DC | PRN

## 2020-06-10 MED ORDER — CLINDAMYCIN PHOSPHATE 900 MG/50ML IV SOLN
INTRAVENOUS | Status: AC
  Filled 2020-06-10: qty 50

## 2020-06-10 MED ORDER — ROCURONIUM BROMIDE 10 MG/ML (PF) SYRINGE
PREFILLED_SYRINGE | INTRAVENOUS | Status: DC | PRN
  Administered 2020-06-10: 10 mg via INTRAVENOUS
  Administered 2020-06-10: 60 mg via INTRAVENOUS

## 2020-06-10 MED ORDER — OXYCODONE HCL 5 MG PO TABS
ORAL_TABLET | ORAL | Status: AC
  Filled 2020-06-10: qty 1

## 2020-06-10 MED ORDER — LIDOCAINE HCL 2 % IJ SOLN
INTRAMUSCULAR | Status: AC
  Filled 2020-06-10: qty 20

## 2020-06-10 MED ORDER — ACETAMINOPHEN 500 MG PO TABS
1000.0000 mg | ORAL_TABLET | ORAL | Status: AC
  Administered 2020-06-10: 1000 mg via ORAL

## 2020-06-10 MED ORDER — LIDOCAINE-EPINEPHRINE 1 %-1:100000 IJ SOLN
INTRAMUSCULAR | Status: DC | PRN
  Administered 2020-06-10: 20 mL

## 2020-06-10 MED ORDER — FENTANYL CITRATE (PF) 100 MCG/2ML IJ SOLN
INTRAMUSCULAR | Status: DC | PRN
  Administered 2020-06-10: 50 ug via INTRAVENOUS
  Administered 2020-06-10: 100 ug via INTRAVENOUS
  Administered 2020-06-10: 50 ug via INTRAVENOUS

## 2020-06-10 MED ORDER — GABAPENTIN 100 MG PO CAPS
ORAL_CAPSULE | ORAL | Status: AC
  Filled 2020-06-10: qty 1

## 2020-06-10 MED ORDER — DEXTROSE IN LACTATED RINGERS 5 % IV SOLN: INTRAVENOUS | Status: DC

## 2020-06-10 MED ORDER — OXYCODONE HCL 5 MG PO TABS
5.0000 mg | ORAL_TABLET | ORAL | Status: DC | PRN
  Administered 2020-06-10 – 2020-06-11 (×4): 5 mg via ORAL

## 2020-06-10 MED ORDER — PHENYLEPHRINE 40 MCG/ML (10ML) SYRINGE FOR IV PUSH (FOR BLOOD PRESSURE SUPPORT)
PREFILLED_SYRINGE | INTRAVENOUS | Status: DC | PRN
  Administered 2020-06-10: 160 ug via INTRAVENOUS
  Administered 2020-06-10 (×2): 120 ug via INTRAVENOUS

## 2020-06-10 MED ORDER — EPHEDRINE 5 MG/ML INJ
INTRAVENOUS | Status: AC
  Filled 2020-06-10: qty 10

## 2020-06-10 MED ORDER — 0.9 % SODIUM CHLORIDE (POUR BTL) OPTIME
TOPICAL | Status: DC | PRN
  Administered 2020-06-10: 500 mL

## 2020-06-10 MED ORDER — POVIDONE-IODINE 10 % EX SWAB
2.0000 "application " | Freq: Once | CUTANEOUS | Status: AC
  Administered 2020-06-10: 2 via TOPICAL

## 2020-06-10 MED ORDER — KETOROLAC TROMETHAMINE 30 MG/ML IJ SOLN
INTRAMUSCULAR | Status: DC | PRN
  Administered 2020-06-10: 30 mg via INTRAVENOUS

## 2020-06-10 MED ORDER — LIDOCAINE-EPINEPHRINE 1 %-1:100000 IJ SOLN
INTRAMUSCULAR | Status: AC
  Filled 2020-06-10: qty 1

## 2020-06-10 MED ORDER — DEXAMETHASONE SODIUM PHOSPHATE 10 MG/ML IJ SOLN
INTRAMUSCULAR | Status: DC | PRN
  Administered 2020-06-10: 10 mg via INTRAVENOUS

## 2020-06-10 MED ORDER — CLINDAMYCIN PHOSPHATE 900 MG/50ML IV SOLN
900.0000 mg | INTRAVENOUS | Status: AC
  Administered 2020-06-10: 900 mg via INTRAVENOUS

## 2020-06-10 MED ORDER — GABAPENTIN 100 MG PO CAPS
100.0000 mg | ORAL_CAPSULE | Freq: Three times a day (TID) | ORAL | Status: DC
  Administered 2020-06-10 – 2020-06-11 (×2): 100 mg via ORAL

## 2020-06-10 MED ORDER — FENTANYL CITRATE (PF) 250 MCG/5ML IJ SOLN
INTRAMUSCULAR | Status: AC
  Filled 2020-06-10: qty 5

## 2020-06-10 MED ORDER — ROCURONIUM BROMIDE 10 MG/ML (PF) SYRINGE
PREFILLED_SYRINGE | INTRAVENOUS | Status: AC
  Filled 2020-06-10: qty 10

## 2020-06-10 MED ORDER — ESTRADIOL 0.1 MG/GM VA CREA
TOPICAL_CREAM | VAGINAL | Status: AC
  Filled 2020-06-10: qty 42.5

## 2020-06-10 MED ORDER — KETOROLAC TROMETHAMINE 30 MG/ML IJ SOLN
30.0000 mg | Freq: Once | INTRAMUSCULAR | Status: AC
  Administered 2020-06-11: 30 mg via INTRAVENOUS

## 2020-06-10 MED ORDER — KETOROLAC TROMETHAMINE 30 MG/ML IJ SOLN
INTRAMUSCULAR | Status: AC
  Filled 2020-06-10: qty 1

## 2020-06-10 MED ORDER — MIDAZOLAM HCL 2 MG/2ML IJ SOLN
INTRAMUSCULAR | Status: AC
  Filled 2020-06-10: qty 2

## 2020-06-10 MED ORDER — ENOXAPARIN SODIUM 40 MG/0.4ML ~~LOC~~ SOLN: 40.0000 mg | SUBCUTANEOUS | Status: DC

## 2020-06-10 MED ORDER — GABAPENTIN 300 MG PO CAPS
ORAL_CAPSULE | ORAL | Status: AC
  Filled 2020-06-10: qty 1

## 2020-06-10 MED ORDER — DEXAMETHASONE SODIUM PHOSPHATE 10 MG/ML IJ SOLN
INTRAMUSCULAR | Status: AC
  Filled 2020-06-10: qty 1

## 2020-06-10 MED ORDER — IBUPROFEN 800 MG PO TABS: 800.0000 mg | ORAL_TABLET | Freq: Four times a day (QID) | ORAL | Status: DC

## 2020-06-10 MED ORDER — GENTAMICIN SULFATE 40 MG/ML IJ SOLN
5.0000 mg/kg | INTRAVENOUS | Status: AC
  Administered 2020-06-10: 330 mg via INTRAVENOUS
  Filled 2020-06-10: qty 8.25

## 2020-06-10 MED ORDER — PROMETHAZINE HCL 25 MG/ML IJ SOLN
12.5000 mg | Freq: Four times a day (QID) | INTRAMUSCULAR | Status: DC | PRN
  Administered 2020-06-10: 12.5 mg via INTRAVENOUS

## 2020-06-10 MED ORDER — EPHEDRINE SULFATE-NACL 50-0.9 MG/10ML-% IV SOSY
PREFILLED_SYRINGE | INTRAVENOUS | Status: DC | PRN
  Administered 2020-06-10: 10 mg via INTRAVENOUS

## 2020-06-10 MED ORDER — HYDROMORPHONE HCL 1 MG/ML IJ SOLN
INTRAMUSCULAR | Status: AC
  Filled 2020-06-10: qty 1

## 2020-06-10 MED ORDER — ACETAMINOPHEN 325 MG PO TABS: 650.0000 mg | ORAL_TABLET | ORAL | Status: DC | PRN

## 2020-06-10 MED ORDER — DOCUSATE SODIUM 100 MG PO CAPS
100.0000 mg | ORAL_CAPSULE | Freq: Two times a day (BID) | ORAL | Status: DC
  Administered 2020-06-10 – 2020-06-11 (×2): 100 mg via ORAL

## 2020-06-10 MED ORDER — ONDANSETRON HCL 4 MG/2ML IJ SOLN
INTRAMUSCULAR | Status: DC | PRN
  Administered 2020-06-10: 4 mg via INTRAVENOUS

## 2020-06-10 MED ORDER — HYDROMORPHONE HCL 1 MG/ML IJ SOLN
0.2000 mg | INTRAMUSCULAR | Status: DC | PRN
  Administered 2020-06-10 (×2): 0.25 mg via INTRAVENOUS

## 2020-06-10 MED ORDER — TEMAZEPAM 15 MG PO CAPS: 15.0000 mg | ORAL_CAPSULE | Freq: Every evening | ORAL | Status: DC | PRN

## 2020-06-10 MED ORDER — ACETAMINOPHEN 500 MG PO TABS
ORAL_TABLET | ORAL | Status: AC
  Filled 2020-06-10: qty 2

## 2020-06-10 MED ORDER — LIDOCAINE HCL (PF) 2 % IJ SOLN
INTRAMUSCULAR | Status: AC
  Filled 2020-06-10: qty 5

## 2020-06-10 MED ORDER — LIDOCAINE 2% (20 MG/ML) 5 ML SYRINGE
INTRAMUSCULAR | Status: DC | PRN
  Administered 2020-06-10: 100 mg via INTRAVENOUS

## 2020-06-10 MED ORDER — KETOROLAC TROMETHAMINE 30 MG/ML IJ SOLN
30.0000 mg | Freq: Four times a day (QID) | INTRAMUSCULAR | Status: DC
  Administered 2020-06-10 – 2020-06-11 (×2): 30 mg via INTRAVENOUS

## 2020-06-10 MED ORDER — GABAPENTIN 300 MG PO CAPS
300.0000 mg | ORAL_CAPSULE | ORAL | Status: AC
  Administered 2020-06-10: 300 mg via ORAL

## 2020-06-10 MED ORDER — SUGAMMADEX SODIUM 200 MG/2ML IV SOLN
INTRAVENOUS | Status: DC | PRN
  Administered 2020-06-10: 200 mg via INTRAVENOUS

## 2020-06-10 MED ORDER — ESTRADIOL 0.1 MG/GM VA CREA
TOPICAL_CREAM | VAGINAL | Status: DC | PRN
  Administered 2020-06-10: 1 via VAGINAL

## 2020-06-10 MED ORDER — BISACODYL 10 MG RE SUPP: 10.0000 mg | Freq: Every day | RECTAL | Status: DC | PRN

## 2020-06-10 SURGICAL SUPPLY — 26 items
BLADE SURG 10 STRL SS (BLADE) ×1 IMPLANT
CANISTER SUCT 3000ML PPV (MISCELLANEOUS) ×2 IMPLANT
COVER MAYO STAND STRL (DRAPES) ×1 IMPLANT
DECANTER SPIKE VIAL GLASS SM (MISCELLANEOUS) IMPLANT
GAUZE 4X4 16PLY RFD (DISPOSABLE) ×3 IMPLANT
GAUZE PACKING 2X5 YD STRL (GAUZE/BANDAGES/DRESSINGS) ×1 IMPLANT
GLOVE SURG LTX SZ7 (GLOVE) ×4 IMPLANT
GLOVE SURG UNDER POLY LF SZ6.5 (GLOVE) ×2 IMPLANT
GLOVE SURG UNDER POLY LF SZ7 (GLOVE) ×4 IMPLANT
GOWN STRL REUS W/TWL LRG LVL3 (GOWN DISPOSABLE) ×10 IMPLANT
KIT TURNOVER CYSTO (KITS) ×2 IMPLANT
NDL SPNL 18GX3.5 QUINCKE PK (NEEDLE) ×1 IMPLANT
NEEDLE SPNL 18GX3.5 QUINCKE PK (NEEDLE) ×2 IMPLANT
NS IRRIG 1000ML POUR BTL (IV SOLUTION) ×2 IMPLANT
PACK VAGINAL WOMENS (CUSTOM PROCEDURE TRAY) ×2 IMPLANT
PAD OB MATERNITY 4.3X12.25 (PERSONAL CARE ITEMS) ×2 IMPLANT
SUT VIC AB 0 CT1 18XCR BRD8 (SUTURE) ×3 IMPLANT
SUT VIC AB 0 CT1 27 (SUTURE) ×4
SUT VIC AB 0 CT1 27XBRD ANBCTR (SUTURE) ×2 IMPLANT
SUT VIC AB 0 CT1 8-18 (SUTURE) ×8
SUT VICRYL 0 TIES 12 18 (SUTURE) ×2 IMPLANT
SYR 20ML LL LF (SYRINGE) ×2 IMPLANT
SYR BULB IRRIG 60ML STRL (SYRINGE) ×1 IMPLANT
TOWEL OR 17X26 10 PK STRL BLUE (TOWEL DISPOSABLE) ×4 IMPLANT
TRAY FOLEY W/BAG SLVR 14FR LF (SET/KITS/TRAYS/PACK) ×2 IMPLANT
UNDERPAD 30X36 HEAVY ABSORB (UNDERPADS AND DIAPERS) ×2 IMPLANT

## 2020-06-10 NOTE — Anesthesia Procedure Notes (Signed)
Procedure Name: Intubation Date/Time: 06/10/2020 12:47 PM Performed by: Rogers Blocker, CRNA Pre-anesthesia Checklist: Patient identified, Emergency Drugs available, Suction available and Patient being monitored Patient Re-evaluated:Patient Re-evaluated prior to induction Oxygen Delivery Method: Circle System Utilized Preoxygenation: Pre-oxygenation with 100% oxygen Induction Type: IV induction Ventilation: Mask ventilation without difficulty Laryngoscope Size: Mac and 3 Grade View: Grade I Tube type: Oral Number of attempts: 1 Airway Equipment and Method: Stylet Placement Confirmation: ETT inserted through vocal cords under direct vision,  positive ETCO2 and breath sounds checked- equal and bilateral Secured at: 22 cm Tube secured with: Tape Dental Injury: Teeth and Oropharynx as per pre-operative assessment

## 2020-06-10 NOTE — Interval H&P Note (Signed)
History and Physical Interval Note:  06/10/2020 12:29 PM  Teresa Hudson  has presented today for surgery, with the diagnosis of FIBROIDS MENORRHAGIA.  The various methods of treatment have been discussed with the patient and family. After consideration of risks, benefits and other options for treatment, the patient has consented to  Procedure(s): HYSTERECTOMY VAGINAL WITH SALPINGECTOMY (Bilateral) as a surgical intervention.  The patient's history has been reviewed, patient examined, no change in status, stable for surgery.  I have reviewed the patient's chart and labs.  Questions were answered to the patient's satisfaction.     [name]

## 2020-06-10 NOTE — Anesthesia Postprocedure Evaluation (Signed)
Anesthesia Post Note  Patient: Teresa Hudson  Procedure(s) Performed: HYSTERECTOMY VAGINAL WITH bilateral SALPINGECTOMY  uterus = 586.5grams (Bilateral Uterus)     Patient location during evaluation: PACU Anesthesia Type: General Level of consciousness: awake and alert Pain management: pain level controlled Vital Signs Assessment: post-procedure vital signs reviewed and stable Respiratory status: spontaneous breathing, nonlabored ventilation, respiratory function stable and patient connected to nasal cannula oxygen Cardiovascular status: blood pressure returned to baseline and stable Postop Assessment: no apparent nausea or vomiting Anesthetic complications: no   No complications documented.  Last Vitals:  Vitals:   06/10/20 1630 06/10/20 1730  BP: 122/81 110/70  Pulse: 95 98  Resp: 16 16  Temp:  36.7 C  SpO2: 98% 99%    Last Pain:  Vitals:   06/10/20 1730  TempSrc:   PainSc: 7                  Barnet Glasgow

## 2020-06-10 NOTE — Transfer of Care (Signed)
Immediate Anesthesia Transfer of Care Note  Patient: Teresa Hudson  Procedure(s) Performed: Procedure(s) (LRB): HYSTERECTOMY VAGINAL WITH bilateral SALPINGECTOMY  uterus = 586.5grams (Bilateral)  Patient Location: PACU  Anesthesia Type: General  Level of Consciousness: awake, alert  and oriented  Airway & Oxygen Therapy: Patient Spontanous Breathing and Patient connected to nasal cannula oxygen  Post-op Assessment: Report given to PACU RN and Post -op Vital signs reviewed and stable  Post vital signs: Reviewed and stable  Complications: No apparent anesthesia complications Last Vitals:  Vitals Value Taken Time  BP 133/81 06/10/20 1503  Temp 36.7 C 06/10/20 1503  Pulse 111 06/10/20 1506  Resp 18 06/10/20 1506  SpO2 100 % 06/10/20 1506  Vitals shown include unvalidated device data.  Last Pain:  Vitals:   06/10/20 1130  TempSrc: Oral  PainSc: 0-No pain      Patients Stated Pain Goal: 4 (37/34/28 7681)  Complications: No complications documented.

## 2020-06-10 NOTE — Op Note (Signed)
Preoperative diagnosis: menorrhagia, fibroid uterus  Postoperative diagnosis: Same  Procedure: Transvaginal hysterectomy   Surgeon: Standley Dakins. Kennon Rounds, M.D.  Assistant: Hebert Soho, MD An experienced assistant was required given the standard of surgical care given the complexity of the case.  This assistant was needed for exposure, dissection, suctioning, retraction, instrument exchange.  Anesthesia: General ETT-,Houser, Ishmael Holter, MD, MD  Findings: 586 gm fibroid uterus.  Estimated blood loss: 250 cc  Specimen: Uterus to pathology  Reason for procedure: Patient had long h/o bleeding and pelvic pain.  Had previous attempt at IUD which fell outThe patient desired definitive treatment.  Risks of  hysterectomy reviewed.  Risks include but are not limited to bleeding, infection, injury to surrounding structures, including bowel, bladder and ureters, blood clots, and death.  Likelihood of success of surgery is high.   Procedure: Patient was taken to the OR where she was placed in dorsal lithotomy in [place]. She was prepped and draped in the usual sterile fashion. A timeout was performed. The patient received 1 g of Ancef prior to procedure. The patient had SCDs in place.  A speculum was placed inside the vagina. The cervix was visualized and grasped with 2 doublle-tooth tenacula. 20 cc of 1% lidocaine with epinephrine were injected paracervically. A knife was used to make a circumferential incision around the vagina. An opened sponge was used to dissect the vagina off the cervix. The posterior peritoneum was entered sharply with Mayo scissors. The posterior peritoneum was tagged to the vaginal cuff with a single stitch. The anterior peritoneum was not opened and the bladder dissected superiorly. A Heaney clamp was used to clamp first the left uterosacral ligament and cardinal which was then cut and Haney suture ligated with 0 Vicryl stitch, the stitch was held. Similarly the right uterosacral  ligament was clamped cut and suture ligated.. Sequential bites up the broad to the uterine arteries. Attempt to enter anteriorly again was not successful. A Foley catheter is placed inside her bladder. The anterior peritoneal cavity was entered bluntly knowing where the bladder was. The uterus was then morcellated until it could be delivered and the left utero-ovarian pedicle grasped with a Heaney clamp. The right utero-ovarian pedicle was similarly grasped with the Heaney clamp. The uterus was amputated and removed. Free tie and suture ligature done for hemostasis. Tubes grasped with Kelly clamps, cut and suture ligated. Bleeding from the right side required free tie.  Inspection of all pedicles revealed bleeding above the uterosacral ligaments and these pedicles were grasped again with Heaney clamps and suture ligated again. There was some bleeding noted at the vaginal cuff. The vagina was closed with 0 Vicryl suture in a locked running fashion with care taken to incorporate the uterosacral pedicles. Excellent hemostasis was noted at the end of the case. The vaginal cuff was inspected there was minimal bleeding noted.   Clear, yellow urine was noted. Vagina packed with 1 inch packing and estrace cream. All instrument needle and lap counts were correct x 2. Patient was awakened taken to recovery room in stable condition.  [name], MD 06/10/2020, 2:43 PM

## 2020-06-11 ENCOUNTER — Encounter (HOSPITAL_BASED_OUTPATIENT_CLINIC_OR_DEPARTMENT_OTHER): Payer: Self-pay | Admitting: Family Medicine

## 2020-06-11 LAB — CBC
HCT: 30.6 % — ABNORMAL LOW (ref 36.0–46.0)
Hemoglobin: 9.3 g/dL — ABNORMAL LOW (ref 12.0–15.0)
MCH: 25.4 pg — ABNORMAL LOW (ref 26.0–34.0)
MCHC: 30.4 g/dL (ref 30.0–36.0)
MCV: 83.6 fL (ref 80.0–100.0)
Platelets: 278 10*3/uL (ref 150–400)
RBC: 3.66 MIL/uL — ABNORMAL LOW (ref 3.87–5.11)
RDW: 16.6 % — ABNORMAL HIGH (ref 11.5–15.5)
WBC: 13.8 10*3/uL — ABNORMAL HIGH (ref 4.0–10.5)
nRBC: 0 % (ref 0.0–0.2)

## 2020-06-11 LAB — SURGICAL PATHOLOGY

## 2020-06-11 MED ORDER — OXYCODONE HCL 5 MG PO TABS
ORAL_TABLET | ORAL | Status: AC
  Filled 2020-06-11: qty 1

## 2020-06-11 MED ORDER — KETOROLAC TROMETHAMINE 30 MG/ML IJ SOLN
INTRAMUSCULAR | Status: AC
  Filled 2020-06-11: qty 1

## 2020-06-11 MED ORDER — GABAPENTIN 100 MG PO CAPS
ORAL_CAPSULE | ORAL | Status: AC
  Filled 2020-06-11: qty 1

## 2020-06-11 MED ORDER — DOCUSATE SODIUM 100 MG PO CAPS
ORAL_CAPSULE | ORAL | Status: AC
  Filled 2020-06-11: qty 1

## 2020-06-11 NOTE — Discharge Summary (Signed)
Physician Discharge Summary  Patient ID: Teresa Hudson MRN: [id number] DOB/AGE: 1975/06/24 45 y.o.  Admit date: 06/10/2020 Discharge date:   Admission Diagnoses:  Active Problems:   Menorrhagia   Fibroids   Status post hysterectomy   Discharge Diagnoses:  Same  Past Medical History:  Diagnosis Date  . Anemia 1997  . Chlamydia   . COVID 12/2019   fever, covid pneumonia, cough, chills loss of taste and smell body sches x 2 weeks all symptoms resolved  . Fibroids   . GERD (gastroesophageal reflux disease)   . Heavy menses many years  . History of recent blood transfusion 08/2019   3 units   . Hypertension 2012 dx  . LSIL (low grade squamous intraepithelial lesion) on Pap smear 01/24/2012   Colpo 01/24/12  . Pneumonia 12/2019   covid pneumonia    Surgeries: Procedure(s): HYSTERECTOMY VAGINAL WITH bilateral SALPINGECTOMY  uterus = 586.5grams on 06/10/2020   Consultants: None  Discharged Condition: Stable  Hospital Course: JODY AGUINAGA is an 45 y.o. female [id number] who was admitted 06/10/2020 with a chief complaint of vaginal bleeding , and found to have a diagnosis of fibroid uterus  They were brought to the operating room on 06/10/2020 and underwent the above named procedures.    She was given perioperative antibiotics:  Anti-infectives (From admission, onward)   Start     Dose/Rate Route Frequency Ordered Stop   06/10/20 1120  clindamycin (CLEOCIN) IVPB 900 mg       "And" Linked Group Details   900 mg 100 mL/hr over 30 Minutes Intravenous 60 min pre-op 06/10/20 1120 06/10/20 1242   06/10/20 1120  gentamicin (GARAMYCIN) 330 mg in dextrose 5 % 100 mL IVPB       "And" Linked Group Details   5 mg/kg  66.4 kg (Adjusted) 108.3 mL/hr over 60 Minutes Intravenous 60 min pre-op 06/10/20 1120 06/10/20 1254    .   She was given sequential compression devices, early ambulation, and chemoprophylaxis for DVT prophylaxis. She had some N/V postop- which resolved. Also with some  mild left sided pain.  She benefited maximally from their hospital stay and there were no complications. She was ambulating, voiding, tolerating po and deemed stable for discharge.  Recent vital signs:  Vitals:   06/11/20 0615 06/11/20 1130  BP: 109/68 135/75  Pulse: 90 (!) 120  Resp: 20 20  Temp: 98 F (36.7 C) 98.3 F (36.8 C)  SpO2: 99% 100%   Discharge exam: Physical Examination: General appearance - alert, well appearing, and in no distress Chest - normal effort Heart - normal rate and regular rhythm Abdomen - soft, appropriately tender, dressing is clean and dry Extremities - peripheral pulses normal, no pedal edema, no clubbing or cyanosis, Homan's sign negative bilaterally Recent laboratory studies:  Results for orders placed or performed during the hospital encounter of 06/10/20  CBC  Result Value Ref Range   WBC 13.8 (H) 4.0 - 10.5 K/uL   RBC 3.66 (L) 3.87 - 5.11 MIL/uL   Hemoglobin 9.3 (L) 12.0 - 15.0 g/dL   HCT 30.6 (L) 36.0 - 46.0 %   MCV 83.6 80.0 - 100.0 fL   MCH 25.4 (L) 26.0 - 34.0 pg   MCHC 30.4 30.0 - 36.0 g/dL   RDW 16.6 (H) 11.5 - 15.5 %   Platelets 278 150 - 400 K/uL   nRBC 0.0 0.0 - 0.2 %  Type and screen [facility]  Result Value Ref Range   ABO/RH(D)  A POS    Antibody Screen POS    Sample Expiration 06/13/2020,2359    Unit Number [id number]    Blood Component Type RED CELLS,LR    Unit division 00    Status of Unit ALLOCATED    Transfusion Status OK TO TRANSFUSE    Crossmatch Result COMPATIBLE   BPAM RBC  Result Value Ref Range   Blood Product Unit Number [id number]    PRODUCT CODE [id number]    Unit Type and Rh 6200    Blood Product Expiration Date [id number]     Discharge Medications:   Allergies as of 06/11/2020      Reactions   Sulfa Antibiotics Anaphylaxis   Aspirin Swelling   Latex Itching, Swelling   Penicillins Swelling   Facial swelling Reaction: Over 20 years ago   Nickel Rash      Medication List     STOP taking these medications   medroxyPROGESTERone 10 MG tablet Commonly known as: Provera   norethindrone 5 MG tablet Commonly known as: Aygestin     TAKE these medications   amLODipine 5 MG tablet Commonly known as: NORVASC Take 1 tablet (5 mg total) by mouth daily.   Black Currant Seed Oil 500 MG Caps Take 500 mg by mouth daily as needed Larey Seat(Fell a cold coming on).   calcium carbonate 500 MG chewable tablet Commonly known as: TUMS - dosed in mg elemental calcium Chew 1 tablet by mouth every 8 (eight) hours as needed for indigestion or heartburn.   clindamycin-benzoyl peroxide gel Commonly known as: BenzaClin Apply topically 2 (two) times daily. What changed:   how much to take  when to take this   Echinacea 400 MG Caps Take 400 mg by mouth daily as needed (Feel  a cold coming on).   ferrous gluconate 324 MG tablet Commonly known as: FERGON Take 1 tablet (324 mg total) by mouth daily with breakfast. What changed: when to take this   metroNIDAZOLE 500 MG tablet Commonly known as: FLAGYL Take 1 tablet (500 mg total) by mouth 2 (two) times daily.   oxyCODONE 5 MG immediate release tablet Commonly known as: Oxy IR/ROXICODONE Take 1 tablet (5 mg total) by mouth every 6 (six) hours as needed for severe pain.   Turmeric 450 MG Caps Take 450 mg by mouth daily as needed (Feel a cold coming on).   Vitamin D3 25 MCG tablet Commonly known as: Vitamin D Take 1,000 Units by mouth daily.   WOMENS MULTI PO Take 1 tablet by mouth daily.   zinc gluconate 50 MG tablet Take 50 mg by mouth every other day.       Diagnostic Studies: No results found.  Disposition: Discharge disposition: 01-Home or Self Care       Discharge Instructions     Remove dressing in 72 hours   Complete by: As directed    Call MD for:  persistant nausea and vomiting   Complete by: As directed    Call MD for:  redness, tenderness, or signs of infection (pain, swelling, redness, odor or  green/yellow discharge around incision site)   Complete by: As directed    Call MD for:  severe uncontrolled pain   Complete by: As directed    Call MD for:  temperature >100.4   Complete by: As directed    Diet - low sodium heart healthy   Complete by: As directed    Discharge patient   Complete by: As directed    Discharge disposition:  01-Home or Self Care   Discharge patient date: 06/11/2020   Driving Restrictions   Complete by: As directed    None while taking narcotic pain meds   Increase activity slowly   Complete by: As directed    Lifting restrictions   Complete by: As directed    Nothing > 20 lbs x 6 weeks   Sexual Activity Restrictions   Complete by: As directed    None x 6 weeks       Follow-up [place] for [facility].   Specialty: Obstetrics and Gynecology Why: postop check, they will call you with an appointment Contact information: [place] [phone]               Signed: [name] 06/11/2020, 11:38 AM

## 2020-06-11 NOTE — Discharge Instructions (Signed)
Vaginal Hysterectomy, Care After You may take Ibuprofen/Midol/Advil/Aleve after 3:00 pm today.  The following information offers guidance on how to care for yourself after your procedure. Your health care provider may also give you more specific instructions. If you have problems or questions, contact your health care provider. What can I expect after the procedure? After the procedure, it is common to have:  Pain in the lower abdomen and vagina.  Vaginal bleeding and discharge for up to 1 week. You will need to use a sanitary pad after this procedure.  Difficulty having a bowel movement (constipation).  Temporary problems emptying the bladder.  Tiredness (fatigue).  Poor appetite.  Less interest in sex.  Feelings of sadness or other emotions. If your ovaries were also removed, it is also common to have symptoms of menopause, such as hot flashes, night sweats, and lack of sleep (insomnia). Follow these instructions at home: Medicines  Take over-the-counter and prescription medicines only as told by your health care provider.  Do not take aspirin or NSAIDs, such as ibuprofen. These medicines can cause bleeding.  Ask your health care provider if the medicine prescribed to you: ? Requires you to avoid driving or using machinery. ? Can cause constipation. You may need to take these actions to prevent or treat constipation:  Drink enough fluid to keep your urine pale yellow.  Take over-the-counter or prescription medicines.  Eat foods that are high in fiber, such as beans, whole grains, and fresh fruits and vegetables.  Limit foods that are high in fat and processed sugars, such as fried or sweet foods.   Activity  Rest as told by your health care provider.  Return to your normal activities as told by your health care provider. Ask your health care provider what activities are safe for you  Avoid sitting for a long time without moving. Get up to take short walks every 1-2  hours. This is important to improve blood flow and breathing. Ask for help if you feel weak or unsteady.  Try to have someone home with you for 1-2 weeks to help you with everyday chores.  Do not lift anything that is heavier than 10 lb (4.5 kg), or the limit that you are told, until your health care provider says that it is safe.  If you were given a sedative during the procedure, it can affect you for several hours. Do not drive or operate machinery until your health care provider says that it is safe.   Lifestyle  Do not use any products that contain nicotine or tobacco. These products include cigarettes, chewing tobacco, and vaping devices, such as e-cigarettes. These can delay healing after surgery. If you need help quitting, ask your health care provider.  Do not drink alcohol until your health care provider approves. General instructions  Do not douche, use tampons, or have sex for at least 6 weeks, or as told by your health care provider.  If you struggle with physical or emotional changes after your procedure, speak with your health care provider or a therapist.  The stitches inside your vagina will dissolve over time and do not need to be taken out.  Do not take baths, swim, or use a hot tub until your health care provider approves. You may only be allowed to take showers for 2-3 weeks  Wear compression stockings as told by your health care provider. These stockings help to prevent blood clots and reduce swelling in your legs.  Keep all follow-up visits. This  is important. Contact a health care provider if:  Your pain medicine is not helping.  You have a fever.  You have nausea or vomiting that does not go away.  You feel dizzy.  You have blood, pus, or a bad-smelling discharge from your vagina more than 1 week after the procedure.  You continue to have trouble urinating 3-5 days after the procedure. Get help right away if:  You have severe pain in your abdomen or  back.  You faint.  You have heavy vaginal bleeding and blood clots, soaking through a sanitary pad in less than 1 hour.  You have chest pain or shortness of breath.  You have pain, swelling, or redness in your leg. These symptoms may represent a serious problem that is an emergency. Do not wait to see if the symptoms will go away. Get medical help right away. Call your local emergency services (911 in the [place] Do not drive yourself to the hospital. Summary  After the procedure, it is common to have pain, vaginal bleeding, constipation, temporary problems emptying your bladder, and feelings of sadness or other emotions.  Take over-the-counter and prescription medicines only as told by your health care provider.  Rest as told by your health care provider. Return to your normal activities as told by your health care provider.  Contact a health care provider if your pain medicine is not helping, or you have a fever, dizziness, or trouble urinating several days after the procedure.  Get help right away if you have severe pain in your abdomen or back, or if you faint, have heavy bleeding, or have chest pain or shortness of breath. This information is not intended to replace advice given to you by your health care provider. Make sure you discuss any questions you have with your health care provider. Document Revised: 12/28/2019 Document Reviewed: 12/28/2019 Elsevier Patient Education  [place].

## 2020-06-11 NOTE — Progress Notes (Signed)
Vaginal packing removed as ordered small amt of sanguinous drg noted on packing. New peri pad in place will monitor vaginal discharge

## 2020-06-12 ENCOUNTER — Telehealth: Payer: Self-pay | Admitting: Family Medicine

## 2020-06-14 LAB — TYPE AND SCREEN
ABO/RH(D): A POS
Antibody Screen: POSITIVE
Unit division: 0

## 2020-06-14 LAB — BPAM RBC
Blood Product Expiration Date: 202202202359
Unit Type and Rh: 6200

## 2020-07-04 ENCOUNTER — Telehealth: Payer: Self-pay | Admitting: General Practice

## 2020-07-04 NOTE — Telephone Encounter (Signed)
Called patient regarding mychart message. Patient reports intermittent leg pain for past couple of weeks. Patient states it occurs in different parts of her legs throughout the day. Patient denies sharp persistent pain in one part of her leg like the back of her calf/behind the knee. She states pain can occur anywhere. She states its not like a muscle cramp, it's more of an annoying pain. Discussed with patient pain is likely not directly related to surgery but more related to change in activity. Advised she try increasing her activity slowly to see if that helps. Also advised she call us back if pain worsens. Patient verbalized understanding & asked about post op appt. Told patient she does need to be seen and I would have the front office staff reach out to her with an appt. Patient verbalized understanding.

## 2020-07-11 ENCOUNTER — Other Ambulatory Visit: Payer: Self-pay

## 2020-07-11 ENCOUNTER — Encounter: Payer: Self-pay | Admitting: Family Medicine

## 2020-07-11 ENCOUNTER — Ambulatory Visit (INDEPENDENT_AMBULATORY_CARE_PROVIDER_SITE_OTHER): Payer: Self-pay | Admitting: Family Medicine

## 2020-07-11 VITALS — BP 134/95 | HR 86 | Ht 64.0 in | Wt 184.3 lb

## 2020-07-11 DIAGNOSIS — Z90710 Acquired absence of both cervix and uterus: Secondary | ICD-10-CM

## 2020-07-11 NOTE — Progress Notes (Signed)
   Subjective:    Patient ID: Teresa Hudson is a 45 y.o. female presenting with Post-op Follow-up  on 07/11/2020  HPI: Patient is s/p TVH with bilateral salpingectomy on 06/10/2020. She is doing well except for having some gas, notes gas is excruciating prior to BMs. None in last few days.. She also reports some yellow vaginal discharge.  Review of Systems  Constitutional: Negative for chills and fever.  Respiratory: Negative for shortness of breath.   Cardiovascular: Negative for chest pain.  Gastrointestinal: Negative for abdominal pain, nausea and vomiting.  Genitourinary: Negative for dysuria.  Skin: Negative for rash.      Objective:    BP (!) 134/95 (BP Location: Right Arm)   Pulse 86   Ht 5\' 4"  (1.626 m)   Wt 184 lb 4.8 oz (83.6 kg)   LMP 06/04/2020   BMI 31.64 kg/m  Physical Exam Constitutional:      General: She is not in acute distress.    Appearance: She is well-developed and well-nourished.  HENT:     Head: Normocephalic and atraumatic.  Eyes:     General: No scleral icterus. Cardiovascular:     Rate and Rhythm: Normal rate.  Pulmonary:     Effort: Pulmonary effort is normal.  Abdominal:     Palpations: Abdomen is soft.  Genitourinary:    Comments: Vagina examined, cuff intact, suture is still present, yellow discharge noted--normal for postop Musculoskeletal:     Cervical back: Neck supple.  Skin:    General: Skin is warm and dry.  Neurological:     Mental Status: She is alert and oriented to person, place, and time.  Psychiatric:        Mood and Affect: Mood and affect normal.         Assessment & Plan:  Status post hysterectomy - Healing well. Precautions reveiwed.   Return in about 2 weeks (around 07/25/2020) for in person, postop check.  [name] 07/11/2020 9:42 AM

## 2020-07-25 ENCOUNTER — Ambulatory Visit: Payer: Self-pay | Admitting: Obstetrics and Gynecology

## 2020-07-29 ENCOUNTER — Other Ambulatory Visit: Payer: Self-pay | Admitting: Nurse Practitioner

## 2020-07-29 ENCOUNTER — Other Ambulatory Visit: Payer: Self-pay

## 2020-07-29 ENCOUNTER — Ambulatory Visit: Payer: Self-pay | Attending: Nurse Practitioner | Admitting: Nurse Practitioner

## 2020-07-29 ENCOUNTER — Encounter: Payer: Self-pay | Admitting: Nurse Practitioner

## 2020-07-29 VITALS — Ht 64.0 in | Wt 184.0 lb

## 2020-07-29 DIAGNOSIS — D649 Anemia, unspecified: Secondary | ICD-10-CM

## 2020-07-29 DIAGNOSIS — L700 Acne vulgaris: Secondary | ICD-10-CM

## 2020-07-29 DIAGNOSIS — I10 Essential (primary) hypertension: Secondary | ICD-10-CM

## 2020-07-29 MED ORDER — ADAPALENE 0.1 % EX GEL: CUTANEOUS | 1 refills | Status: DC

## 2020-07-29 MED ORDER — AMLODIPINE BESYLATE 10 MG PO TABS: 10.0000 mg | ORAL_TABLET | Freq: Every day | ORAL | 1 refills | Status: DC

## 2020-07-29 MED FILL — AMLODIPINE BESYLATE 10 MG T: 10 | 30 days supply | Qty: 30 | Fill #0

## 2020-07-29 NOTE — Progress Notes (Signed)
Virtual Visit via Telephone Note Due to national recommendations of social distancing due to [name] 19, telehealth visit is felt to be most appropriate for this patient at this time.  I discussed the limitations, risks, security and privacy concerns of performing an evaluation and management service by telephone and the availability of in person appointments. I also discussed with the patient that there may be a patient responsible charge related to this service. The patient expressed understanding and agreed to proceed.    I connected with Teresa Hudson on 07/29/20  at  10:30 AM EDT  EDT by telephone and verified that I am speaking with the correct person using two identifiers.   Consent I discussed the limitations, risks, security and privacy concerns of performing an evaluation and management service by telephone and the availability of in person appointments. I also discussed with the patient that there may be a patient responsible charge related to this service. The patient expressed understanding and agreed to proceed.   Location of Patient: Private  Residence   Location of Provider: [facility] participating in Telemedicine visit: [name] FNP-BC [place]    History of Present Illness: Telemedicine visit for: Follow up Doing well today. S/P hysterectomy with bilateral salpingectomy. Notes increased flatus/gas. Recommended she continue her Gas-x and Tums prn. Also discussed dietary modifications.    Essential Hypertension Most recent home blood pressure reading 138/86 HR 85. Blood pressure still not optimal with amlodipine 5 mg daily. Will increase to 10 mg daily. She should continue to monitor her blood pressure at home. Denies chest pain, shortness of breath, lightheadedness, dizziness, headaches or BLE edema. Notes palpitations have decreased since her hysterectomy.  BP Readings from Last 3 Encounters:   07/11/20 (!) 134/95  06/11/20 135/75  06/06/20 (!) 150/97   Wt Readings from Last 3 Encounters:  07/29/20 184 lb (83.5 kg)  07/11/20 184 lb 4.8 oz (83.6 kg)  06/10/20 184 lb 8 oz (83.7 kg)    Acne Vulgaris Benzaclin ineffective. WIll try Differin gel.   Past Medical History:  Diagnosis Date  . Anemia 1997  . Chlamydia   . COVID 12/2019   fever, covid pneumonia, cough, chills loss of taste and smell body sches x 2 weeks all symptoms resolved  . Fibroids   . GERD (gastroesophageal reflux disease)   . Heavy menses many years  . History of recent blood transfusion 08/2019   3 units   . Hypertension 2012 dx  . LSIL (low grade squamous intraepithelial lesion) on Pap smear 01/24/2012   Colpo 01/24/12  . Pneumonia 12/2019   covid pneumonia    Past Surgical History:  Procedure Laterality Date  . EPIDURAL BLOCK INJECTION  2012   did not work for childbirth  . VAGINAL HYSTERECTOMY Bilateral 06/10/2020   Procedure: HYSTERECTOMY VAGINAL WITH bilateral SALPINGECTOMY  uterus = 586.5grams;  Surgeon: [name], MD;  Location: [facility];  Service: Gynecology;  Laterality: Bilateral;  . wisdom tooth removal  yrs ago    Family History  Problem Relation Age of Onset  . Diabetes Mother   . Breast cancer Mother 71  . Cancer Father        prostate  . Hypertension Father   . Asthma Sister   . GI problems Sister   . Thyroid cancer Sister     Social History   Socioeconomic History  . Marital status: Single    Spouse  name: Not on file  . Number of children: 3  . Years of education: Not on file  . Highest education level: Some college, no degree  Occupational History  . Not on file  Tobacco Use  . Smoking status: Never Smoker  . Smokeless tobacco: Never Used  Vaping Use  . Vaping Use: Never used  Substance and Sexual Activity  . Alcohol use: Yes    Comment: ocassionally   . Drug use: No  . Sexual activity: Not Currently    Birth control/protection: Condom   Other Topics Concern  . Not on file  Social History Narrative  . Not on file   Social Determinants of Health   Financial Resource Strain: Not on file  Food Insecurity: No Food Insecurity  . Worried About Charity fundraiser in the Last Year: Never true  . Ran Out of Food in the Last Year: Never true  Transportation Needs: No Transportation Needs  . Lack of Transportation (Medical): No  . Lack of Transportation (Non-Medical): No  Physical Activity: Not on file  Stress: Not on file  Social Connections: Not on file     Observations/Objective: Awake, alert and oriented x 3   Review of Systems  Constitutional: Negative for fever, malaise/fatigue and weight loss.  HENT: Negative.  Negative for nosebleeds.   Eyes: Negative.  Negative for blurred vision, double vision and photophobia.  Respiratory: Negative.  Negative for cough and shortness of breath.   Cardiovascular: Negative.  Negative for chest pain, palpitations and leg swelling.  Gastrointestinal: Positive for abdominal pain (increased gas). Negative for blood in stool, constipation, diarrhea, heartburn, melena, nausea and vomiting.       SEE HPI  Musculoskeletal: Negative.  Negative for myalgias.  Skin:       Acne vulgaris  Neurological: Negative.  Negative for dizziness, focal weakness, seizures and headaches.  Psychiatric/Behavioral: Negative.  Negative for suicidal ideas.    Assessment and Plan: Diagnoses and all orders for this visit:  Essential hypertension -     amLODipine (NORVASC) 10 MG tablet; Take 1 tablet (10 mg total) by mouth daily. Continue all antihypertensives as prescribed.  Remember to bring in your blood pressure log with you for your follow up appointment.  DASH/Mediterranean Diets are healthier choices for HTN.   Symptomatic anemia -     CBC; Future  Acne vulgaris -     adapalene (DIFFERIN) 0.1 % gel; Apply once daily in the evening before bedtime. If irritation occurs, may reduce the frequency  of application.     Follow Up Instructions Return in about 3 months (around 10/29/2020).     I discussed the assessment and treatment plan with the patient. The patient was provided an opportunity to ask questions and all were answered. The patient agreed with the plan and demonstrated an understanding of the instructions.   The patient was advised to call back or seek an in-person evaluation if the symptoms worsen or if the condition fails to improve as anticipated.  I provided 14 minutes of non-face-to-face time during this encounter including median intraservice time, reviewing previous notes, labs, imaging, medications and explaining diagnosis and management.  Gildardo Pounds, FNP-BC

## 2020-07-30 ENCOUNTER — Encounter: Payer: Self-pay | Admitting: Nurse Practitioner

## 2020-08-11 ENCOUNTER — Encounter: Payer: Self-pay | Admitting: Family Medicine

## 2020-08-11 ENCOUNTER — Other Ambulatory Visit (HOSPITAL_COMMUNITY)
Admission: RE | Admit: 2020-08-11 | Discharge: 2020-08-11 | Disposition: A | Discharge status: Home / Self Care | Payer: Self-pay | Source: Ambulatory Visit | Attending: Obstetrics and Gynecology | Admitting: Obstetrics and Gynecology

## 2020-08-11 ENCOUNTER — Ambulatory Visit (INDEPENDENT_AMBULATORY_CARE_PROVIDER_SITE_OTHER): Payer: Self-pay | Admitting: Family Medicine

## 2020-08-11 ENCOUNTER — Other Ambulatory Visit: Payer: Self-pay

## 2020-08-11 VITALS — BP 161/89 | HR 76 | Ht 64.0 in | Wt 189.0 lb

## 2020-08-11 DIAGNOSIS — Z90710 Acquired absence of both cervix and uterus: Secondary | ICD-10-CM

## 2020-08-11 NOTE — Progress Notes (Signed)
   Subjective:    Patient ID: Teresa Hudson is a 45 y.o. female presenting with Post-op Follow-up  on 08/11/2020  HPI: Denies f/C, dysuria. Notes vaginal discharge. No odor.  Review of Systems  Constitutional: Negative for chills and fever.  Respiratory: Negative for shortness of breath.   Cardiovascular: Negative for chest pain.  Gastrointestinal: Negative for abdominal pain, nausea and vomiting.  Genitourinary: Negative for dysuria.  Skin: Negative for rash.      Objective:    BP (!) 161/89   Pulse 76   Ht 5\' 4"  (1.626 m)   Wt 189 lb (85.7 kg)   LMP 06/04/2020   BMI 32.44 kg/m  Physical Exam Constitutional:      General: Teresa Hudson is not in acute distress.    Appearance: Teresa Hudson is well-developed.  HENT:     Head: Normocephalic and atraumatic.  Eyes:     General: No scleral icterus. Cardiovascular:     Rate and Rhythm: Normal rate.  Pulmonary:     Effort: Pulmonary effort is normal.  Abdominal:     Palpations: Abdomen is soft.  Musculoskeletal:     Cervical back: Neck supple.  Skin:    General: Skin is warm and dry.  Neurological:     Mental Status: Teresa Hudson is alert and oriented to person, place, and time.    Granulation tissue at the top of the cuff, touched with AgNO3. Vaginal discharge yellow noted. No tenderness     Assessment & Plan:   Problem List Items Addressed This Visit      Unprioritized   Status post hysterectomy - Primary    Granulation tissue noted. Will treat today and weekly until resolved. OK to resume normal activity.         Return in about 1 week (around 08/18/2020) for in person, postop check.  [name] 08/11/2020 10:22 AM

## 2020-08-11 NOTE — Addendum Note (Signed)
Addended byMariane Baumgarten on: 08/11/2020 10:50 AM   Modules accepted: Orders

## 2020-08-11 NOTE — Assessment & Plan Note (Signed)
Granulation tissue noted. Will treat today and weekly until resolved. OK to resume normal activity.

## 2020-08-11 NOTE — Patient Instructions (Signed)

## 2020-08-12 LAB — CERVICOVAGINAL ANCILLARY ONLY
Bacterial Vaginitis (gardnerella): POSITIVE — AB
Candida Glabrata: NEGATIVE
Candida Vaginitis: NEGATIVE
Chlamydia: NEGATIVE
Comment: NEGATIVE
Comment: NEGATIVE
Comment: NEGATIVE
Comment: NEGATIVE
Comment: NEGATIVE
Comment: NORMAL
Neisseria Gonorrhea: NEGATIVE
Trichomonas: NEGATIVE

## 2020-08-13 MED ORDER — METRONIDAZOLE 500 MG PO TABS: 500.0000 mg | ORAL_TABLET | Freq: Two times a day (BID) | ORAL | 0 refills | Status: AC

## 2020-08-13 NOTE — Addendum Note (Signed)
Addended by: [name] on: 08/13/2020 09:42 AM   Modules accepted: Orders

## 2020-08-21 ENCOUNTER — Ambulatory Visit (INDEPENDENT_AMBULATORY_CARE_PROVIDER_SITE_OTHER): Payer: Self-pay | Admitting: Family Medicine

## 2020-08-21 ENCOUNTER — Ambulatory Visit: Payer: Self-pay | Admitting: Family Medicine

## 2020-08-21 ENCOUNTER — Encounter: Payer: Self-pay | Admitting: Family Medicine

## 2020-08-21 DIAGNOSIS — Z90710 Acquired absence of both cervix and uterus: Secondary | ICD-10-CM

## 2020-08-21 NOTE — Assessment & Plan Note (Signed)
Treated area again with AgNO3.

## 2020-08-21 NOTE — Progress Notes (Signed)
   Subjective:    Patient ID: Teresa Hudson is a 45 y.o. female presenting with Follow-up  on 08/21/2020  HPI: Patient with some granulation tissue at site of prior [facility]. Treated last week with AgNO3. Some spotting after this. No bleeding since.  Review of Systems  Constitutional: Negative for chills and fever.  Respiratory: Negative for shortness of breath.   Cardiovascular: Negative for chest pain.  Gastrointestinal: Negative for abdominal pain, nausea and vomiting.  Genitourinary: Negative for dysuria.  Skin: Negative for rash.      Objective:    BP (!) 140/91   Pulse 90   Ht 5\' 4"  (1.626 m)   Wt 184 lb 14.4 oz (83.9 kg)   LMP 06/04/2020   BMI 31.74 kg/m  Physical Exam Constitutional:      General: She is not in acute distress.    Appearance: She is well-developed.  HENT:     Head: Normocephalic and atraumatic.  Eyes:     General: No scleral icterus. Cardiovascular:     Rate and Rhythm: Normal rate.  Pulmonary:     Effort: Pulmonary effort is normal.  Abdominal:     Palpations: Abdomen is soft.  Genitourinary:    Comments: Small amount of granulation tissue at top of the cuff touched with AgNO3. Musculoskeletal:     Cervical back: Neck supple.  Skin:    General: Skin is warm and dry.  Neurological:     Mental Status: She is alert and oriented to person, place, and time.         Assessment & Plan:   Problem List Items Addressed This Visit      Unprioritized   Status post hysterectomy    Treated area again with AgNO3.         Return in about 1 week (around 08/28/2020) for in person.  [name] 08/21/2020 5:14 PM

## 2020-08-27 ENCOUNTER — Encounter: Payer: Self-pay | Admitting: Family Medicine

## 2020-08-27 ENCOUNTER — Ambulatory Visit (INDEPENDENT_AMBULATORY_CARE_PROVIDER_SITE_OTHER): Payer: Self-pay | Admitting: Family Medicine

## 2020-08-27 VITALS — BP 132/96 | HR 95 | Wt 186.6 lb

## 2020-08-27 DIAGNOSIS — Z90710 Acquired absence of both cervix and uterus: Secondary | ICD-10-CM

## 2020-08-27 NOTE — Progress Notes (Signed)
   Subjective:    Patient ID: Teresa Hudson is a 45 y.o. female presenting with Post-op Follow-up  on 08/27/2020  HPI: S/p TVH. Here for treatment of granulation tissue at cuff. Previously treated with AgNO3 x 2. No bleeding, no pain, no discharge.  Review of Systems  Constitutional: Negative for chills and fever.  Respiratory: Negative for shortness of breath.   Cardiovascular: Negative for chest pain.  Gastrointestinal: Negative for abdominal pain, nausea and vomiting.  Genitourinary: Negative for dysuria.  Skin: Negative for rash.      Objective:    BP (!) 132/96   Pulse 95   Wt 186 lb 9.6 oz (84.6 kg)   LMP 06/04/2020   BMI 32.03 kg/m  Physical Exam Constitutional:      General: She is not in acute distress.    Appearance: She is well-developed.  HENT:     Head: Normocephalic and atraumatic.  Eyes:     General: No scleral icterus. Cardiovascular:     Rate and Rhythm: Normal rate.  Pulmonary:     Effort: Pulmonary effort is normal.  Abdominal:     Palpations: Abdomen is soft.  Genitourinary:    Comments: Pinpoint area of granulation tissue, treated with AgNO3 Musculoskeletal:     Cervical back: Neck supple.  Skin:    General: Skin is warm and dry.  Neurological:     Mental Status: She is alert and oriented to person, place, and time.         Assessment & Plan:  Status post hysterectomy - last treatment of granulation tissue at cuff--Ok to resume sexual activity    Return if symptoms worsen or fail to improve.  Teresa Hudson 08/28/2020 8:28 AM

## 2020-08-28 ENCOUNTER — Encounter: Payer: Self-pay | Admitting: Family Medicine

## 2020-09-15 ENCOUNTER — Ambulatory Visit: Payer: Self-pay | Admitting: Family Medicine

## 2020-10-14 ENCOUNTER — Other Ambulatory Visit: Payer: Self-pay

## 2020-10-14 MED FILL — Amlodipine Besylate Tab 10 MG (Base Equivalent): ORAL | 30 days supply | Qty: 30 | Fill #0 | Status: AC

## 2020-10-14 MED FILL — Adapalene Gel 0.1%: CUTANEOUS | Qty: 90 | Fill #0 | Status: CN

## 2020-10-15 ENCOUNTER — Other Ambulatory Visit: Payer: Self-pay

## 2020-10-20 ENCOUNTER — Encounter: Payer: Self-pay | Admitting: Nurse Practitioner

## 2020-10-22 ENCOUNTER — Ambulatory Visit: Payer: Self-pay | Admitting: Critical Care Medicine

## 2020-10-26 ENCOUNTER — Other Ambulatory Visit: Payer: Self-pay | Admitting: Nurse Practitioner

## 2020-11-11 ENCOUNTER — Other Ambulatory Visit: Payer: Self-pay

## 2020-11-11 ENCOUNTER — Other Ambulatory Visit: Payer: Self-pay | Admitting: Nurse Practitioner

## 2020-11-11 ENCOUNTER — Encounter: Payer: Self-pay | Admitting: Nurse Practitioner

## 2020-11-11 MED ORDER — DEXTROMETHORPHAN-GUAIFENESIN 10-100 MG/5ML PO LIQD
10.0000 mL | Freq: Four times a day (QID) | ORAL | 1 refills | Status: DC | PRN
  Filled 2020-11-11: qty 240, 6d supply, fill #0

## 2020-11-12 ENCOUNTER — Other Ambulatory Visit: Payer: Self-pay

## 2020-11-16 MED FILL — Amlodipine Besylate Tab 10 MG (Base Equivalent): ORAL | 30 days supply | Qty: 30 | Fill #1 | Status: AC

## 2020-11-17 ENCOUNTER — Other Ambulatory Visit: Payer: Self-pay

## 2020-11-26 ENCOUNTER — Other Ambulatory Visit: Payer: Self-pay

## 2020-11-26 ENCOUNTER — Ambulatory Visit: Payer: Self-pay | Attending: Physician Assistant | Admitting: Physician Assistant

## 2020-11-26 DIAGNOSIS — Z862 Personal history of diseases of the blood and blood-forming organs and certain disorders involving the immune mechanism: Secondary | ICD-10-CM

## 2020-11-26 DIAGNOSIS — U099 Post covid-19 condition, unspecified: Secondary | ICD-10-CM

## 2020-11-26 DIAGNOSIS — R053 Chronic cough: Secondary | ICD-10-CM

## 2020-11-26 DIAGNOSIS — I10 Essential (primary) hypertension: Secondary | ICD-10-CM

## 2020-11-26 MED ORDER — AZITHROMYCIN 250 MG PO TABS: ORAL_TABLET | ORAL | 0 refills | Status: AC

## 2020-11-26 MED ORDER — AMLODIPINE BESYLATE 10 MG PO TABS: ORAL_TABLET | Freq: Every day | ORAL | 1 refills | Status: DC

## 2020-11-26 NOTE — Progress Notes (Signed)
Patient ID: Teresa Hudson, female   DOB: 08/30/1975, 45 y.o.   MRN: [id number] Virtual Visit via Telephone Note  I connected with [name] on 11/26/20 at  3:10 PM EDT by telephone and verified that I am speaking with the correct person using two identifiers.  Location: Patient: home Provider: [facility] office   I discussed the limitations, risks, security and privacy concerns of performing an evaluation and management service by telephone and the availability of in person appointments. I also discussed with the patient that there may be a patient responsible charge related to this service. The patient expressed understanding and agreed to proceed.   History of Present Illness: BP usu ~120s/80s.  Needs amlodipine RF.  No issues or concerns.  Had a hysterectomy earlier in the year.  Had covid about 3 weeks ago.  Feeling better for the most part but having some residual cough.  No fevers.  Mucus is yellowish white.  She did develop pneumonia the last time she had covid.  No SOB.     Observations/Objective: NAD.  A&Ox3  Assessment and Plan: 1. Essential hypertension controlled - amLODipine (NORVASC) 10 MG tablet; TAKE 1 TABLET (10 MG TOTAL) BY MOUTH DAILY.  Dispense: 90 tablet; Refill: 1 - Basic metabolic panel; Future  2. Post-COVID chronic cough If needed to cover for atypicals - azithromycin (ZITHROMAX) 250 MG tablet; Take 2 tablets on day 1, then 1 tablet daily on days 2 through 5  Dispense: 6 tablet; Refill: 0  3. History of anemia Hysterectomy 06/10/2020 without sequelae - CBC with Differential/Platelet; Future    Follow Up Instructions: See PCP in 4-6 months   I discussed the assessment and treatment plan with the patient. The patient was provided an opportunity to ask questions and all were answered. The patient agreed with the plan and demonstrated an understanding of the instructions.   The patient was advised to call back or seek an in-person evaluation if the symptoms  worsen or if the condition fails to improve as anticipated.  I provided 15 minutes of non-face-to-face time during this encounter.   Freeman Caldron, PA-C

## 2020-11-27 ENCOUNTER — Other Ambulatory Visit: Payer: Self-pay

## 2020-11-27 ENCOUNTER — Encounter (HOSPITAL_COMMUNITY): Payer: Self-pay | Admitting: Radiology

## 2020-11-27 ENCOUNTER — Emergency Department (HOSPITAL_COMMUNITY)
Admission: EM | Admit: 2020-11-27 | Discharge: 2020-11-27 | Disposition: A | Discharge status: Home / Self Care | Payer: Self-pay | Attending: Emergency Medicine | Admitting: Emergency Medicine

## 2020-11-27 ENCOUNTER — Emergency Department (HOSPITAL_COMMUNITY): Payer: Self-pay

## 2020-11-27 DIAGNOSIS — R000 Tachycardia, unspecified: Secondary | ICD-10-CM | POA: Insufficient documentation

## 2020-11-27 DIAGNOSIS — R072 Precordial pain: Secondary | ICD-10-CM | POA: Insufficient documentation

## 2020-11-27 DIAGNOSIS — I10 Essential (primary) hypertension: Secondary | ICD-10-CM | POA: Insufficient documentation

## 2020-11-27 DIAGNOSIS — M549 Dorsalgia, unspecified: Secondary | ICD-10-CM | POA: Insufficient documentation

## 2020-11-27 DIAGNOSIS — Z8616 Personal history of COVID-19: Secondary | ICD-10-CM | POA: Insufficient documentation

## 2020-11-27 DIAGNOSIS — R12 Heartburn: Secondary | ICD-10-CM | POA: Insufficient documentation

## 2020-11-27 DIAGNOSIS — N9489 Other specified conditions associated with female genital organs and menstrual cycle: Secondary | ICD-10-CM | POA: Insufficient documentation

## 2020-11-27 DIAGNOSIS — Z79899 Other long term (current) drug therapy: Secondary | ICD-10-CM | POA: Insufficient documentation

## 2020-11-27 DIAGNOSIS — Z91040 Latex allergy status: Secondary | ICD-10-CM | POA: Insufficient documentation

## 2020-11-27 LAB — HEPATIC FUNCTION PANEL
ALT: 23 U/L (ref 0–44)
AST: 22 U/L (ref 15–41)
Albumin: 3.8 g/dL (ref 3.5–5.0)
Alkaline Phosphatase: 54 U/L (ref 38–126)
Bilirubin, Direct: <0.1 mg/dL (ref 0.0–0.2)
Total Bilirubin: 0.6 mg/dL (ref 0.3–1.2)
Total Protein: 7.5 g/dL (ref 6.5–8.1)

## 2020-11-27 LAB — BASIC METABOLIC PANEL
Anion gap: 9 (ref 5–15)
BUN: 11 mg/dL (ref 6–20)
CO2: 20 mmol/L — ABNORMAL LOW (ref 22–32)
Calcium: 9.7 mg/dL (ref 8.9–10.3)
Chloride: 109 mmol/L (ref 98–111)
Creatinine, Ser: 0.81 mg/dL (ref 0.44–1.00)
GFR, Estimated: >60 mL/min (ref >60)
Glucose, Bld: 93 mg/dL (ref 70–99)
Potassium: 3.9 mmol/L (ref 3.5–5.1)
Sodium: 138 mmol/L (ref 135–145)

## 2020-11-27 LAB — CBC
HCT: 50.8 % — ABNORMAL HIGH (ref 36.0–46.0)
Hemoglobin: 15.2 g/dL — ABNORMAL HIGH (ref 12.0–15.0)
MCH: 27.7 pg (ref 26.0–34.0)
MCHC: 29.9 g/dL — ABNORMAL LOW (ref 30.0–36.0)
MCV: 92.5 fL (ref 80.0–100.0)
Platelets: 353 10*3/uL (ref 150–400)
RBC: 5.49 MIL/uL — ABNORMAL HIGH (ref 3.87–5.11)
RDW: 17 % — ABNORMAL HIGH (ref 11.5–15.5)
WBC: 8.6 10*3/uL (ref 4.0–10.5)
nRBC: 0 % (ref 0.0–0.2)

## 2020-11-27 LAB — TROPONIN I (HIGH SENSITIVITY)
Troponin I (High Sensitivity): 2 ng/L (ref <18)
Troponin I (High Sensitivity): <2 ng/L (ref <18)

## 2020-11-27 LAB — I-STAT BETA HCG BLOOD, ED (MC, WL, AP ONLY): I-stat hCG, quantitative: <5 m[IU]/mL (ref <5)

## 2020-11-27 LAB — LIPASE, BLOOD: Lipase: 29 U/L (ref 11–51)

## 2020-11-27 MED ORDER — ALUM & MAG HYDROXIDE-SIMETH 200-200-20 MG/5ML PO SUSP
30.0000 mL | Freq: Once | ORAL | Status: AC
  Administered 2020-11-27: 30 mL via ORAL
  Filled 2020-11-27: qty 30

## 2020-11-27 MED ORDER — LIDOCAINE VISCOUS HCL 2 % MT SOLN
15.0000 mL | Freq: Once | OROMUCOSAL | Status: AC
  Administered 2020-11-27: 15 mL via ORAL
  Filled 2020-11-27: qty 15

## 2020-11-27 MED ORDER — OMEPRAZOLE 20 MG PO CPDR: 20.0000 mg | DELAYED_RELEASE_CAPSULE | Freq: Every day | ORAL | 0 refills | Status: DC

## 2020-11-27 MED ORDER — SUCRALFATE 1 G PO TABS: 1.0000 g | ORAL_TABLET | Freq: Three times a day (TID) | ORAL | 0 refills | Status: DC

## 2020-11-27 MED ORDER — IOHEXOL 350 MG/ML SOLN
80.0000 mL | Freq: Once | INTRAVENOUS | Status: AC | PRN
  Administered 2020-11-27: 80 mL via INTRAVENOUS

## 2020-11-27 NOTE — ED Triage Notes (Addendum)
Pt arrives via EMS with c/o of sudden onset of chest pain , Centralized deep pressure chest pain. Radiated to back.  Allergy to ASA. EMS gave 0.4 nitro X1 with little improvement.  Diagnosed with covid July 5.2022  22LAC 142/80 HR 70 RR 16 SpO2 96%

## 2020-11-27 NOTE — ED Notes (Signed)
Patient transported to CT 

## 2020-11-27 NOTE — ED Provider Notes (Signed)
[place] EMERGENCY DEPARTMENT Provider Note   CSN: [id number] Arrival date & time: 11/27/20  0758     History Chief Complaint  Patient presents with   Chest Pain    Teresa Hudson is a 45 y.o. female.  The history is provided by the patient and medical records. No language interpreter was used.  Chest Pain Pain location:  Substernal area, L chest and R chest Pain quality: aching and pressure   Pain radiates to:  Upper back Pain severity:  Moderate Onset quality:  Sudden Duration:  1 day Timing:  Constant Progression:  Waxing and waning Chronicity:  New Relieved by:  Nothing Worsened by:  Nothing Ineffective treatments:  None tried Associated symptoms: back pain and heartburn   Associated symptoms: no abdominal pain, no anxiety, no claudication, no cough (improving), no diaphoresis, no dizziness, no fatigue, no fever, no headache, no lower extremity edema, no nausea, no near-syncope, no palpitations, no shortness of breath and no vomiting   Risk factors: hypertension       Past Medical History:  Diagnosis Date   Anemia 1997   Chlamydia    COVID 12/2019   fever, covid pneumonia, cough, chills loss of taste and smell body sches x 2 weeks all symptoms resolved   Fibroids    GERD (gastroesophageal reflux disease)    Heavy menses many years   History of recent blood transfusion 08/2019   3 units    Hypertension 2012 dx   LSIL (low grade squamous intraepithelial lesion) on Pap smear 01/24/2012   Colpo 01/24/12   Pneumonia 12/2019   covid pneumonia    Patient Active Problem List   Diagnosis Date Noted   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   Symptomatic anemia 08/26/2019   Acne 06/25/2016   Vitamin D insufficiency 10/20/2014   Allergic rhinitis 10/18/2014   Fatigue 01/29/2014   Liver lesion, right lobe 01/29/2014   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   Hypertension 12/05/2012    Past Surgical History:  Procedure Laterality Date   EPIDURAL BLOCK  INJECTION  2012   did not work for childbirth   VAGINAL HYSTERECTOMY Bilateral 06/10/2020   Procedure: HYSTERECTOMY VAGINAL WITH bilateral SALPINGECTOMY  uterus = 586.5grams;  Surgeon: [name], MD;  Location: [facility];  Service: Gynecology;  Laterality: Bilateral;   wisdom tooth removal  yrs ago     OB History     Gravida  5   Para  3   Term  3   Preterm      AB  2   Living  3      SAB      IAB  2   Ectopic      Multiple      Live Births  3           Family History  Problem Relation Age of Onset   Diabetes Mother    Breast cancer Mother 75   Cancer Father        prostate   Hypertension Father    Asthma Sister    GI problems Sister    Thyroid cancer Sister     Social History   Tobacco Use   Smoking status: Never   Smokeless tobacco: Never  Vaping Use   Vaping Use: Never used  Substance Use Topics   Alcohol use: Yes    Comment: ocassionally    Drug use: No    Home Medications Prior to Admission medications   Medication Sig Start  Date End Date Taking? Authorizing Provider  adapalene (DIFFERIN) 0.1 % gel APPLY ONCE DAILY IN THE EVENING BEFORE BEDTIME. IF IRRITATION OCCURS, MAY REDUCE THE FREQUENCY OF APPLICATION. Patient not taking: Reported on 08/27/2020 07/29/20 07/29/21  Gildardo Pounds, NP  amLODipine (NORVASC) 10 MG tablet TAKE 1 TABLET (10 MG TOTAL) BY MOUTH DAILY. 11/26/20 11/26/21  Argentina Donovan, PA-C  azithromycin (ZITHROMAX) 250 MG tablet Take 2 tablets on day 1, then 1 tablet daily on days 2 through 5 11/26/20 12/01/20  Argentina Donovan, PA-C  calcium carbonate (TUMS - DOSED IN MG ELEMENTAL CALCIUM) 500 MG chewable tablet Chew 1 tablet by mouth every 8 (eight) hours as needed for indigestion or heartburn.    [provider]  dextromethorphan-guaiFENesin (TUSSIN DM) 10-100 MG/5ML liquid Take 10 mLs by mouth every 6 (six) hours as needed for cough. 11/11/20   Gildardo Pounds, NP  Multiple Vitamin (MULTIVITAMIN  PO) Take by mouth.    [provider]  Vitamin D3 (VITAMIN D) 25 MCG tablet Take 1,000 Units by mouth daily. Patient not taking: Reported on 08/27/2020    [provider]  albuterol (VENTOLIN HFA) 108 (90 Base) MCG/ACT inhaler Inhale 2 puffs into the lungs every 4 (four) hours as needed for wheezing or shortness of breath. Patient not taking: Reported on 02/01/2020 01/06/20 04/18/20  Hall-Potvin, [place], PA-C  fluticasone (FLONASE) 50 MCG/ACT nasal spray Place 1 spray into both nostrils daily. Patient not taking: Reported on 02/01/2020 01/06/20 04/18/20  Hall-Potvin, [place], PA-C  hydrochlorothiazide (HYDRODIURIL) 12.5 MG tablet Take 1 tablet (12.5 mg total) by mouth daily. Patient not taking: Reported on 11/30/2017 06/25/16 08/26/19  Boykin Nearing, MD  potassium chloride SA (K-DUR,KLOR-CON) 20 MEQ tablet Take 1 tablet (20 mEq total) by mouth 2 (two) times daily. Patient not taking: Reported on 11/30/2017 03/03/13 08/26/19  Palumbo, April, MD    Allergies    Covid-19 mrna vaccine (pfizer) [covid-19 mrna vacc (moderna)], Sulfa antibiotics, Aspirin, Latex, Penicillins, and Nickel  Review of Systems   Review of Systems  Constitutional:  Negative for chills, diaphoresis, fatigue and fever.  HENT:  Negative for congestion.   Respiratory:  Positive for chest tightness. Negative for cough (improving) and shortness of breath.   Cardiovascular:  Positive for chest pain. Negative for palpitations, claudication and near-syncope.  Gastrointestinal:  Positive for heartburn. Negative for abdominal pain, constipation, diarrhea, nausea and vomiting.  Genitourinary:  Negative for dysuria, flank pain and frequency.  Musculoskeletal:  Positive for back pain. Negative for neck pain and neck stiffness.  Skin:  Negative for rash and wound.  Neurological:  Negative for dizziness, light-headedness and headaches.  Psychiatric/Behavioral:  Negative for agitation and confusion.   All other systems  reviewed and are negative.  Physical Exam Updated Vital Signs BP (!) 155/107   Pulse (!) 106   Temp 98.7 F (37.1 C) (Oral)   Resp 18   Ht 5\' 4"  (1.626 m)   Wt 84.4 kg   LMP 06/04/2020   SpO2 100%   BMI 31.93 kg/m   Physical Exam Vitals and nursing note reviewed.  Constitutional:      General: She is not in acute distress.    Appearance: She is well-developed. She is not ill-appearing, toxic-appearing or diaphoretic.  HENT:     Head: Normocephalic and atraumatic.     Nose: Nose normal.     Mouth/Throat:     Mouth: Mucous membranes are moist.  Eyes:     Conjunctiva/sclera: Conjunctivae normal.  Cardiovascular:  Rate and Rhythm: Regular rhythm. Tachycardia present.     Heart sounds: No murmur heard. Pulmonary:     Effort: Pulmonary effort is normal. No respiratory distress.     Breath sounds: Normal breath sounds. No wheezing, rhonchi or rales.  Chest:     Chest wall: No tenderness.  Abdominal:     General: Abdomen is flat.     Palpations: Abdomen is soft.     Tenderness: There is no abdominal tenderness. There is no guarding or rebound.  Musculoskeletal:        General: No tenderness.     Cervical back: Neck supple. No tenderness.     Right lower leg: No edema.     Left lower leg: No edema.  Skin:    General: Skin is warm and dry.     Capillary Refill: Capillary refill takes less than 2 seconds.     Findings: No erythema.  Neurological:     General: No focal deficit present.     Mental Status: She is alert.  Psychiatric:        Mood and Affect: Mood normal.    ED Results / Procedures / Treatments   Labs (all labs ordered are listed, but only abnormal results are displayed) Labs Reviewed  BASIC METABOLIC PANEL - Abnormal; Notable for the following components:      Result Value   CO2 20 (*)    All other components within normal limits  CBC - Abnormal; Notable for the following components:   RBC 5.49 (*)    Hemoglobin 15.2 (*)    HCT 50.8 (*)    MCHC  29.9 (*)    RDW 17.0 (*)    All other components within normal limits  HEPATIC FUNCTION PANEL  LIPASE, BLOOD  I-STAT BETA HCG BLOOD, ED ([facility], [facility], [facility] ONLY)  TROPONIN I (HIGH SENSITIVITY)  TROPONIN I (HIGH SENSITIVITY)    EKG EKG Interpretation  Date/Time:  Thursday November 27 2020 07:59:41 EDT Ventricular Rate:  94 PR Interval:  148 QRS Duration: 76 QT Interval:  336 QTC Calculation: 420 R Axis:   -16 Text Interpretation: Normal sinus rhythm Septal infarct , age undetermined Abnormal ECG When compared to prior, similar appearance. No STEMI Confirmed by Antony Blackbird [phone]) on 11/27/2020 11:18:38 AM  Radiology DG Chest 2 View  Result Date: 11/27/2020 CLINICAL DATA:  Sudden onset of chest pain. EXAM: CHEST - 2 VIEW COMPARISON:  04/17/2020. FINDINGS: Mediastinum and hilar structures normal. Lungs are clear. No pleural effusion or pneumothorax. Heart size normal. No acute bony abnormality. IMPRESSION: No acute cardiopulmonary disease. Electronically Signed   By: Marcello Moores  Register   On: 11/27/2020 08:50   CT Angio Chest PE W and/or Wo Contrast  Result Date: 11/27/2020 CLINICAL DATA:  Centralized chest pain and shortness of breath EXAM: CT ANGIOGRAPHY CHEST WITH CONTRAST TECHNIQUE: Multidetector CT imaging of the chest was performed using the standard protocol during bolus administration of intravenous contrast. Multiplanar CT image reconstructions and MIPs were obtained to evaluate the vascular anatomy. CONTRAST:  34mL OMNIPAQUE IOHEXOL 350 MG/ML SOLN COMPARISON:  Chest x-ray from earlier in the same day. FINDINGS: Cardiovascular: Thoracic aorta is well visualize without aneurysmal dilatation or dissection. No cardiac enlargement is seen. No significant coronary calcifications are noted. Pulmonary artery shows a normal branching pattern. No discrete filling defect is identified to suggest pulmonary embolism. Mediastinum/Nodes: Thoracic inlet is within normal limits. No sizable hilar or mediastinal  adenopathy is noted. The esophagus is within normal limits. Lungs/Pleura: Lungs  are well aerated bilaterally. No focal infiltrate or sizable effusion is seen. No pneumothorax is noted. Upper Abdomen: Visualized upper abdomen shows no acute abnormality. Musculoskeletal: No chest wall abnormality. No acute or significant osseous findings. Review of the MIP images confirms the above findings. IMPRESSION: No evidence of pulmonary emboli. No acute abnormality noted. Electronically Signed   By: Inez Catalina M.D.   On: 11/27/2020 15:52    Procedures Procedures   Medications Ordered in ED Medications  alum & mag hydroxide-simeth (MAALOX/MYLANTA) 200-200-20 MG/5ML suspension 30 mL (30 mLs Oral Given 11/27/20 1155)    And  lidocaine (XYLOCAINE) 2 % viscous mouth solution 15 mL (15 mLs Oral Given 11/27/20 1155)  iohexol (OMNIPAQUE) 350 MG/ML injection 80 mL (80 mLs Intravenous Contrast Given 11/27/20 1527)    ED Course  I have reviewed the triage vital signs and the nursing notes.  Pertinent labs & imaging results that were available during my care of the patient were reviewed by me and considered in my medical decision making (see chart for details).    MDM Rules/Calculators/A&P                           Allisyn LENORIA NARINE is a 45 y.o. female with a past medical history significant for hypertension, fibroids, previous anemia, GERD, and recent COVID diagnosis over 2 weeks ago who presents with sudden onset chest pain this morning.  Patient reports she woke up around 6:30 AM with a central tightness and crushing pain in her central chest wrapping around both sides towards her back.  She denies shortness of breath but reports significant chest pain.  She reports it was greater than 10 initially but it has now improved to a 5 out of 10.  She reports no significant nausea, vomiting, diaphoresis, or radiation down her arms.  She denies trauma.  She reports she has been doing very well with her COVID aside from some  mild cough that was persistent until yesterday.  She denies any constipation or diarrhea.  Denies any urinary changes.  She does say that she ate some meat loaf last night late at night which is atypical for her but this feels different than previous reflux troubles.  She reports the pain does not go straight through to her back but more on the sides.  Blood pressure was in the 130s to 150s.  It is not sharp or tearing, at this time low suspicion for an aortic cause of symptoms initially.  On exam, lungs were clear and chest was nontender.  Abdomen was nontender.  Good pulses in extremities.  Legs were nontender and nonedematous.  She denies any leg symptoms.  She does report that she drove back from [place] around 2 weeks ago.  EKG shows no STEMI.  Clinically I suspect symptoms are more related to reflux after the late night meatloaf however, with her recent travel and recent COVID and this sudden onset of chest pain with tachycardia I do feel we need to rule out pulmonary embolism.  With recent COVID, do not suspect D-dimer will be as beneficial so after discussion with patient, we will go to CT scan.  She had a chest x-ray in triage that was reassuring.  Will trend troponin and get basic labs.  We will give her a GI cocktail to help with this is more of an esophageal or gastric discomfort.  Anticipate reassessment after work-up to determine disposition.  Work-up returned very reassuring.  Troponin negative x2.  CT PE study shows no clot or other abnormality.  X-ray reassuring.  Other labs all similar to prior.  Suspect some hemoconcentration with dehydration.  Patient felt remarkably better after the GI cocktail.  I suspect this is more of a reflux type pain related to the meat loaf.  Patient will be given prescription for Prilosec and Carafate and follow-up with both PCP and GI.  She agrees and would like to go home.  Patient discharged in good condition with significant improvement in symptoms and  with reassuring vital signs   Final Clinical Impression(s) / ED Diagnoses Final diagnoses:  Precordial pain    Rx / DC Orders ED Discharge Orders          Ordered    omeprazole (PRILOSEC) 20 MG capsule  Daily        11/27/20 1603    sucralfate (CARAFATE) 1 g tablet  3 times daily with meals & bedtime        11/27/20 1603            Clinical Impression: 1. Precordial pain     Disposition: Admit  This note was prepared with assistance of Dragon voice recognition software. Occasional wrong-word or sound-a-like substitutions may have occurred due to the inherent limitations of voice recognition software.     Chloeann Alfred, Gwenyth Allegra, MD 11/27/20 [phone]

## 2020-11-27 NOTE — Discharge Instructions (Addendum)
Your history, exam, work-up today are consistent with a chest discomfort likely related to her reflux exacerbation.  Your CT scan does not show blood clot or other acute changes from your COVID.  The other work-up was reassuring including negative troponin (heart enzymes) both times we checked them.  We feel you are safe for discharge home.  Please use the medicine to help with your stomach and please follow-up with both a PCP and a gastroenterologist.  If any symptoms change or worsen acutely, please return to the nearest emergency department.

## 2021-02-06 ENCOUNTER — Encounter: Payer: Self-pay | Admitting: Family Medicine

## 2021-02-06 ENCOUNTER — Other Ambulatory Visit: Payer: Self-pay

## 2021-02-06 ENCOUNTER — Ambulatory Visit (INDEPENDENT_AMBULATORY_CARE_PROVIDER_SITE_OTHER): Payer: Self-pay | Admitting: Family Medicine

## 2021-02-06 VITALS — BP 131/87 | HR 99 | Ht 64.0 in | Wt 190.4 lb

## 2021-02-06 DIAGNOSIS — Z90710 Acquired absence of both cervix and uterus: Secondary | ICD-10-CM

## 2021-02-06 NOTE — Progress Notes (Signed)
Pt states now during sex she is experiencing pain with a little bleeding,afterwards cramping.

## 2021-02-06 NOTE — Progress Notes (Signed)
   Subjective:    Patient ID: Teresa Hudson is a 45 y.o. female presenting with Follow-up  on 02/06/2021  HPI: Notes pain and cramping during sex, and having some bleeding as well.  Review of Systems  Constitutional:  Negative for chills and fever.  Respiratory:  Negative for shortness of breath.   Cardiovascular:  Negative for chest pain.  Gastrointestinal:  Negative for abdominal pain, nausea and vomiting.  Genitourinary:  Negative for dysuria.  Skin:  Negative for rash.     Objective:    BP 131/87   Pulse 99   Ht 5\' 4"  (1.626 m)   Wt 190 lb 6.4 oz (86.4 kg)   LMP 06/04/2020   BMI 32.68 kg/m  Physical Exam Constitutional:      General: She is not in acute distress.    Appearance: She is well-developed.  HENT:     Head: Normocephalic and atraumatic.  Eyes:     General: No scleral icterus. Cardiovascular:     Rate and Rhythm: Normal rate.  Pulmonary:     Effort: Pulmonary effort is normal.  Abdominal:     Palpations: Abdomen is soft.  Genitourinary:    Comments: Small amount of granulation tissue at left top of cuff--treated with AgNO3 Musculoskeletal:     Cervical back: Neck supple.  Skin:    General: Skin is warm and dry.  Neurological:     Mental Status: She is alert and oriented to person, place, and time.        Assessment & Plan:   Problem List Items Addressed This Visit       Unprioritized   Status post hysterectomy - Primary    Small amount of granulation tissue noted at top of vaginal and treated with AgNO3.        Return in about 2 weeks (around 02/20/2021) for a follow-up with me undress.  [name] 02/06/2021 10:32 AM

## 2021-02-06 NOTE — Assessment & Plan Note (Signed)
Small amount of granulation tissue noted at top of vaginal and treated with AgNO3.

## 2021-02-16 ENCOUNTER — Encounter: Payer: Self-pay | Admitting: Nurse Practitioner

## 2021-02-16 ENCOUNTER — Other Ambulatory Visit: Payer: Self-pay | Admitting: Nurse Practitioner

## 2021-02-16 MED ORDER — OMEPRAZOLE 20 MG PO CPDR: 20.0000 mg | DELAYED_RELEASE_CAPSULE | Freq: Every day | ORAL | 1 refills | Status: DC

## 2021-02-25 ENCOUNTER — Encounter: Payer: Self-pay | Admitting: Family Medicine

## 2021-02-25 ENCOUNTER — Ambulatory Visit (INDEPENDENT_AMBULATORY_CARE_PROVIDER_SITE_OTHER): Payer: Self-pay | Admitting: Family Medicine

## 2021-02-25 ENCOUNTER — Other Ambulatory Visit: Payer: Self-pay

## 2021-02-25 VITALS — BP 130/85 | HR 79 | Wt 193.0 lb

## 2021-02-25 DIAGNOSIS — N898 Other specified noninflammatory disorders of vagina: Secondary | ICD-10-CM

## 2021-02-25 NOTE — Progress Notes (Signed)
Patient here for hysterectomy follow up. Patient denies any pain, stated that she is "feeling fine"   Nasri Boakye, CMA  02/25/21  3.34 pm

## 2021-02-28 ENCOUNTER — Encounter: Payer: Self-pay | Admitting: Family Medicine

## 2021-02-28 NOTE — Progress Notes (Signed)
    Subjective:    Patient ID: Teresa Hudson is a 45 y.o. female presenting with Follow-up  on 02/25/2021  HPI: Post TVH with some granulation tissue at cuff--has been treated with AgNO3.  Review of Systems  Constitutional:  Negative for chills and fever.  Respiratory:  Negative for shortness of breath.   Cardiovascular:  Negative for chest pain.  Gastrointestinal:  Negative for abdominal pain, nausea and vomiting.  Genitourinary:  Negative for dysuria.  Skin:  Negative for rash.     Objective:    BP 130/85   Pulse 79   Wt 193 lb (87.5 kg)   LMP 06/04/2020   BMI 33.13 kg/m  Physical Exam Constitutional:      General: She is not in acute distress.    Appearance: She is well-developed.  HENT:     Head: Normocephalic and atraumatic.  Eyes:     General: No scleral icterus. Cardiovascular:     Rate and Rhythm: Normal rate.  Pulmonary:     Effort: Pulmonary effort is normal.  Abdominal:     Palpations: Abdomen is soft.  Genitourinary:    Comments: NEFG, cuff intact, 0.76 mm x 0.75 mm granulation tissue noted at right portion of vaginal cuff. Musculoskeletal:     Cervical back: Neck supple.  Skin:    General: Skin is warm and dry.  Neurological:     Mental Status: She is alert and oriented to person, place, and time.    Procedure: Area treated with AgNO3 x 2.     Assessment & Plan:   Problem List Items Addressed This Visit       Unprioritized   Granulation tissue at vaginal vault - Primary    Seems worse, she is requesting a note for her husband stating she needs to abstain from sex to allow this area to heal.       Return in about 2 weeks (around 03/11/2021) for repeat AgNO3 treatment.  [name] 02/28/2021 1:32 AM

## 2021-02-28 NOTE — Assessment & Plan Note (Signed)
Seems worse, she is requesting a note for her husband stating she needs to abstain from sex to allow this area to heal.

## 2021-03-19 ENCOUNTER — Other Ambulatory Visit: Payer: Self-pay

## 2021-03-19 ENCOUNTER — Encounter: Payer: Self-pay | Admitting: Family Medicine

## 2021-03-19 ENCOUNTER — Ambulatory Visit (INDEPENDENT_AMBULATORY_CARE_PROVIDER_SITE_OTHER): Payer: Self-pay | Admitting: Family Medicine

## 2021-03-19 DIAGNOSIS — N898 Other specified noninflammatory disorders of vagina: Secondary | ICD-10-CM

## 2021-03-19 NOTE — Progress Notes (Signed)
   Subjective:    Patient ID: Teresa Hudson is a 45 y.o. female presenting with Follow-up  on 03/19/2021  HPI: Here for f/u. Has h/o TVH with granulation tissue at the cuff. Has been treated several times with AgNO3. Back again. Has been abstinent.  Review of Systems  Constitutional:  Negative for chills and fever.  Respiratory:  Negative for shortness of breath.   Cardiovascular:  Negative for chest pain.  Gastrointestinal:  Negative for abdominal pain, nausea and vomiting.  Genitourinary:  Negative for dysuria.  Skin:  Negative for rash.     Objective:    BP 130/86   Pulse (!) 101   Wt 195 lb 8 oz (88.7 kg)   LMP 06/04/2020   BMI 33.56 kg/m  Physical Exam Constitutional:      General: She is not in acute distress.    Appearance: She is well-developed.  HENT:     Head: Normocephalic and atraumatic.  Eyes:     General: No scleral icterus. Cardiovascular:     Rate and Rhythm: Normal rate.  Pulmonary:     Effort: Pulmonary effort is normal.  Abdominal:     Palpations: Abdomen is soft.  Genitourinary:    Comments: BUS normal, vagina is pink and rugated, 0.75 cm beefy red piece of granulation tissue at cuff.  Musculoskeletal:     Cervical back: Neck supple.  Skin:    General: Skin is warm and dry.  Neurological:     Mental Status: She is alert and oriented to person, place, and time.   Procedure Note Cuff granulation tissue treated with AgNO3 x 2.     Assessment & Plan:   Problem List Items Addressed This Visit       Unprioritized   Granulation tissue at vaginal vault    Treated again with AgNO3        Return in about 3 weeks (around 04/09/2021) for a follow-up with me.  [name] 03/19/2021 3:58 PM

## 2021-03-19 NOTE — Assessment & Plan Note (Signed)
Treated again with AgNO3

## 2021-03-30 ENCOUNTER — Ambulatory Visit: Payer: Self-pay | Admitting: Nurse Practitioner

## 2021-04-06 ENCOUNTER — Ambulatory Visit: Payer: Self-pay | Admitting: Family Medicine

## 2021-05-07 ENCOUNTER — Encounter: Payer: Self-pay | Admitting: Family Medicine

## 2021-05-21 ENCOUNTER — Ambulatory Visit (INDEPENDENT_AMBULATORY_CARE_PROVIDER_SITE_OTHER): Payer: Self-pay | Admitting: Family Medicine

## 2021-05-21 ENCOUNTER — Other Ambulatory Visit: Payer: Self-pay

## 2021-05-21 ENCOUNTER — Other Ambulatory Visit (HOSPITAL_COMMUNITY)
Admission: RE | Admit: 2021-05-21 | Discharge: 2021-05-21 | Disposition: A | Discharge status: Home / Self Care | Payer: Self-pay | Source: Ambulatory Visit | Attending: Family Medicine | Admitting: Family Medicine

## 2021-05-21 ENCOUNTER — Encounter: Payer: Self-pay | Admitting: Family Medicine

## 2021-05-21 VITALS — BP 130/84 | HR 105 | Wt 204.6 lb

## 2021-05-21 DIAGNOSIS — N898 Other specified noninflammatory disorders of vagina: Secondary | ICD-10-CM

## 2021-05-21 NOTE — Progress Notes (Signed)
Patient in for follow up following hysterectomy, granulation tissue at vaginal vault. States she had some vaginal bleeding but that has since resolved. Reports discharge and odor, is not sexually active.  Altha Harm, CMA

## 2021-05-21 NOTE — Progress Notes (Signed)
° ° °  Subjective:    Patient ID: Teresa Hudson is a 46 y.o. female presenting with Follow-up  on 05/21/2021  HPI: Patient return for vaginal discharge and spotting. H/o TVH x many months ago and has had recurrent vaginal granulation tissue aggravated by intercourse. No intercourse x several months and stopped bleeding, but then it resumed.  Review of Systems  Constitutional:  Negative for chills and fever.  Respiratory:  Negative for shortness of breath.   Cardiovascular:  Negative for chest pain.  Gastrointestinal:  Negative for abdominal pain, nausea and vomiting.  Genitourinary:  Negative for dysuria.  Skin:  Negative for rash.     Objective:    BP 130/84    Pulse (!) 105    Wt 204 lb 9.6 oz (92.8 kg)    LMP 06/04/2020    BMI 35.12 kg/m  Physical Exam Constitutional:      General: She is not in acute distress.    Appearance: She is well-developed.  HENT:     Head: Normocephalic and atraumatic.  Eyes:     General: No scleral icterus. Cardiovascular:     Rate and Rhythm: Normal rate.  Pulmonary:     Effort: Pulmonary effort is normal.  Abdominal:     Palpations: Abdomen is soft.  Genitourinary:    Comments: Cuff is without lesion. Very small sub 3 mm roughened area noted and treated with AgNO3. Musculoskeletal:     Cervical back: Neck supple.  Skin:    General: Skin is warm and dry.  Neurological:     Mental Status: She is alert and oriented to person, place, and time.        Assessment & Plan:  Vaginal discharge - Check cultures and treat accordingly - Plan: Cervicovaginal ancillary only( [place])  Granulation tissue at vaginal vault - treated, return for check in 2-3 weeks. No sex until then.   Return in about 2 weeks (around 06/04/2021) for a follow-up.  [name] 05/21/2021 10:30 AM

## 2021-05-22 LAB — CERVICOVAGINAL ANCILLARY ONLY
Bacterial Vaginitis (gardnerella): NEGATIVE
Candida Glabrata: NEGATIVE
Candida Vaginitis: NEGATIVE
Chlamydia: NEGATIVE
Comment: NEGATIVE
Comment: NEGATIVE
Comment: NEGATIVE
Comment: NEGATIVE
Comment: NEGATIVE
Comment: NORMAL
Neisseria Gonorrhea: NEGATIVE
Trichomonas: NEGATIVE

## 2021-06-05 ENCOUNTER — Encounter: Payer: Self-pay | Admitting: Nurse Practitioner

## 2021-06-05 ENCOUNTER — Ambulatory Visit (HOSPITAL_BASED_OUTPATIENT_CLINIC_OR_DEPARTMENT_OTHER): Payer: Self-pay | Admitting: Nurse Practitioner

## 2021-06-05 DIAGNOSIS — L700 Acne vulgaris: Secondary | ICD-10-CM

## 2021-06-05 DIAGNOSIS — I10 Essential (primary) hypertension: Secondary | ICD-10-CM

## 2021-06-05 MED ORDER — ADAPALENE 0.3 % EX GEL: CUTANEOUS | 0 refills | Status: DC

## 2021-06-05 MED ORDER — AMLODIPINE BESYLATE 10 MG PO TABS: ORAL_TABLET | Freq: Every day | ORAL | 1 refills | Status: DC

## 2021-06-05 NOTE — Progress Notes (Signed)
Virtual Visit via Telephone Note Due to national recommendations of social distancing due to [place], telehealth visit is felt to be most appropriate for this patient at this time.  I discussed the limitations, risks, security and privacy concerns of performing an evaluation and management service by telephone and the availability of in person appointments. I also discussed with the patient that there may be a patient responsible charge related to this service. The patient expressed understanding and agreed to proceed.    I connected with Teresa Hudson on 06/05/21  at  11:10 AM EST  EDT by telephone and verified that I am speaking with the correct person using two identifiers.  Location of Patient: Private Residence   Location of Provider: [place] and [employer] Office    Persons participating in Telemedicine visit: Teresa Rankins FNP-BC Teresa Hudson    History of Present Illness: Telemedicine visit for: HTN She has a past medical history of Anemia due to heavy menstrual cycles and now s/p Vaginal hysterectomy (06-2020), Chlamydia, COVID (12/2019), Fibroids, GERD,  Hypertension (2012 dx), LSIL (low grade squamous intraepithelial lesion) on Pap smear (01/24/2012), and Pneumonia (12/2019).   Currently Having some post op issues with excess granulation tissue which is slowly improving at this time. She is being followed by GYN for this.  HTN Blood pressure is well controlled. She is taking amlodipine 10 mg daily as prescribed. She had a few questions regarding amlodipine today. All questions were answered to the extent of my clinical knowledge.  BP Readings from Last 3 Encounters:  05/21/21 130/84  03/19/21 130/86  02/25/21 130/85      Acne Vulgaris Currently using 0.1% Differin gel and states she has noticed some improvement in her skin condition but would like to try to know if there is something stronger that she can take for her acne.  She notes large open pores and  blackheads.   Past Medical History:  Diagnosis Date   Anemia 1997   Chlamydia    COVID 12/2019   fever, covid pneumonia, cough, chills loss of taste and smell body sches x 2 weeks all symptoms resolved   Fibroids    GERD (gastroesophageal reflux disease)    Heavy menses many years   History of recent blood transfusion 08/2019   3 units    Hypertension 2012 dx   LSIL (low grade squamous intraepithelial lesion) on Pap smear 01/24/2012   Colpo 01/24/12   Pneumonia 12/2019   covid pneumonia    Past Surgical History:  Procedure Laterality Date   EPIDURAL BLOCK INJECTION  2012   did not work for childbirth   VAGINAL HYSTERECTOMY Bilateral 06/10/2020   Procedure: HYSTERECTOMY VAGINAL WITH bilateral SALPINGECTOMY  uterus = 586.5grams;  Surgeon: [name], MD;  Location: [facility];  Service: Gynecology;  Laterality: Bilateral;   wisdom tooth removal  yrs ago    Family History  Problem Relation Age of Onset   Diabetes Mother    Breast cancer Mother 88   Cancer Father        prostate   Hypertension Father    Asthma Sister    GI problems Sister    Thyroid cancer Sister     Social History   Socioeconomic History   Marital status: Single    Spouse name: Not on file   Number of children: 3   Years of education: Not on file   Highest education level: Some college, no degree  Occupational History  Not on file  Tobacco Use   Smoking status: Never   Smokeless tobacco: Never  Vaping Use   Vaping Use: Never used  Substance and Sexual Activity   Alcohol use: Yes    Comment: ocassionally    Drug use: No   Sexual activity: Not Currently    Birth control/protection: Condom  Other Topics Concern   Not on file  Social History Narrative   Not on file   Social Determinants of Health   Financial Resource Strain: Not on file  Food Insecurity: Food Insecurity Present   Worried About [place] in the Last Year: Sometimes true   Ran Out of Food in the  Last Year: Sometimes true  Transportation Needs: No Transportation Needs   Lack of Transportation (Medical): No   Lack of Transportation (Non-Medical): No  Physical Activity: Not on file  Stress: Not on file  Social Connections: Not on file     Observations/Objective: Awake, alert and oriented x 3   Review of Systems  Constitutional:  Negative for fever, malaise/fatigue and weight loss.  HENT: Negative.  Negative for nosebleeds.   Eyes: Negative.  Negative for blurred vision, double vision and photophobia.  Respiratory: Negative.  Negative for cough and shortness of breath.   Cardiovascular: Negative.  Negative for chest pain, palpitations and leg swelling.  Gastrointestinal: Negative.  Negative for heartburn, nausea and vomiting.  Musculoskeletal: Negative.  Negative for myalgias.  Skin:        See HPI  Neurological: Negative.  Negative for dizziness, focal weakness, seizures and headaches.  Psychiatric/Behavioral: Negative.  Negative for suicidal ideas.    Assessment and Plan: Diagnoses and all orders for this visit:  Essential hypertension -     amLODipine (NORVASC) 10 MG tablet; TAKE 1 TABLET (10 MG TOTAL) BY MOUTH DAILY. Continue all antihypertensives as prescribed.  Remember to bring in your blood pressure log with you for your follow up appointment.  DASH/Mediterranean Diets are healthier choices for HTN.    Acne vulgaris -     Adapalene (DIFFERIN) 0.3 % gel; Apply once at bedtime.  If irritation occurs reduce frequency of bedtime application     Follow Up Instructions Return in about 3 months (around 09/03/2021).     I discussed the assessment and treatment plan with the patient. The patient was provided an opportunity to ask questions and all were answered. The patient agreed with the plan and demonstrated an understanding of the instructions.   The patient was advised to call back or seek an in-person evaluation if the symptoms worsen or if the condition fails to  improve as anticipated.  I provided 14 minutes of non-face-to-face time during this encounter including median intraservice time, reviewing previous notes, labs, imaging, medications and explaining diagnosis and management.  Gildardo Pounds, FNP-BC

## 2021-07-09 ENCOUNTER — Ambulatory Visit (INDEPENDENT_AMBULATORY_CARE_PROVIDER_SITE_OTHER): Payer: Self-pay | Admitting: Family Medicine

## 2021-07-09 ENCOUNTER — Encounter: Payer: Self-pay | Admitting: Family Medicine

## 2021-07-09 ENCOUNTER — Other Ambulatory Visit: Payer: Self-pay

## 2021-07-09 DIAGNOSIS — N898 Other specified noninflammatory disorders of vagina: Secondary | ICD-10-CM

## 2021-07-09 NOTE — Progress Notes (Signed)
? ?  Subjective:  ? ? Patient ID: Teresa Hudson is a 46 y.o. female presenting with Follow-up ? on 07/09/2021 ? ?HPI: ?Here for f/u. We have been treating some granulation tissue at the cuff with AgNO3. She reports no issues with bleeding since last treatment. ? ?Review of Systems  ?Constitutional:  Negative for chills and fever.  ?Respiratory:  Negative for shortness of breath.   ?Cardiovascular:  Negative for chest pain.  ?Gastrointestinal:  Negative for abdominal pain, nausea and vomiting.  ?Genitourinary:  Negative for dysuria.  ?Skin:  Negative for rash.  ?   ?Objective:  ?  ?BP 130/79   Pulse (!) 101   Wt 202 lb (91.6 kg)   LMP 06/04/2020   BMI 34.67 kg/m?  ?Physical Exam ?Exam conducted with a chaperone present.  ?Constitutional:   ?   General: She is not in acute distress. ?   Appearance: She is well-developed.  ?HENT:  ?   Head: Normocephalic and atraumatic.  ?Eyes:  ?   General: No scleral icterus. ?Cardiovascular:  ?   Rate and Rhythm: Normal rate.  ?Pulmonary:  ?   Effort: Pulmonary effort is normal.  ?Abdominal:  ?   Palpations: Abdomen is soft.  ?Genitourinary: ?   Comments: Cuff is clear without any granulation tissue noted ?Musculoskeletal:  ?   Cervical back: Neck supple.  ?Skin: ?   General: Skin is warm and dry.  ?Neurological:  ?   Mental Status: She is alert and oriented to person, place, and time.  ? ? ? ?   ?Assessment & Plan:  ? ?Problem List Items Addressed This Visit   ? ?  ? Unprioritized  ? Granulation tissue at vaginal vault  ?  Seems resolved today ?  ?  ? ? ?Return if symptoms worsen or fail to improve. ? ?[name], MD ?07/09/2021 ?10:40 AM ? ? ? ?

## 2021-07-09 NOTE — Progress Notes (Signed)
Patient is here to follow up from "overgrowth of granulated tissue". She stated she hasn't been having any pain or discomfort. Denies any vaginal bleeding.   ? ?Teresa Hudson is overall feeling fine. ? ?Conversations about her yearly mammogram was had and I explained to patient that I will refer her to [facility] ? ? ?Aliha Diedrich, CMA  ? ?07/09/21 ?

## 2021-07-09 NOTE — Assessment & Plan Note (Signed)
Seems resolved today ?

## 2021-07-29 ENCOUNTER — Encounter: Payer: Self-pay | Admitting: Nurse Practitioner

## 2021-08-05 ENCOUNTER — Encounter: Payer: Self-pay | Admitting: Nurse Practitioner

## 2021-08-05 ENCOUNTER — Ambulatory Visit: Payer: Self-pay | Attending: Nurse Practitioner | Admitting: Nurse Practitioner

## 2021-08-05 ENCOUNTER — Other Ambulatory Visit: Payer: Self-pay

## 2021-08-05 VITALS — BP 134/87 | HR 102 | Wt 204.7 lb

## 2021-08-05 DIAGNOSIS — D72829 Elevated white blood cell count, unspecified: Secondary | ICD-10-CM

## 2021-08-05 DIAGNOSIS — Z1231 Encounter for screening mammogram for malignant neoplasm of breast: Secondary | ICD-10-CM

## 2021-08-05 DIAGNOSIS — R350 Frequency of micturition: Secondary | ICD-10-CM

## 2021-08-05 DIAGNOSIS — K5909 Other constipation: Secondary | ICD-10-CM

## 2021-08-05 DIAGNOSIS — Z0000 Encounter for general adult medical examination without abnormal findings: Secondary | ICD-10-CM

## 2021-08-05 DIAGNOSIS — I10 Essential (primary) hypertension: Secondary | ICD-10-CM

## 2021-08-05 MED ORDER — SENNOSIDES-DOCUSATE SODIUM 8.6-50 MG PO TABS: 2.0000 | ORAL_TABLET | Freq: Every day | ORAL | 6 refills | Status: AC

## 2021-08-05 NOTE — Addendum Note (Signed)
Addended by: [name] on: 08/05/2021 02:02 PM ? ? Modules accepted: Orders ? ?

## 2021-08-05 NOTE — Progress Notes (Signed)
? ?Assessment & Plan:  ?Teresa Hudson was seen today for hypertension and annual exam. ? ?Diagnoses and all orders for this visit: ? ?Annual physical exam ? ?Breast cancer screening by mammogram ?-     MS DIGITAL SCREENING TOMO BILATERAL; Future ?Referred to BCCCP ? ?Leukocytosis, unspecified type ?-     CBC with Differential ? ?Primary hypertension ?-     CMP14+EGFR ?Continue amlodipine daily.  ?Bring BP log to next office visit.  ? ?Urine frequency ?-     Urinalysis, Complete ? ? ? ?Patient has been counseled on age-appropriate routine health concerns for screening and prevention. These are reviewed and up-to-date. Referrals have been placed accordingly. Immunizations are up-to-date or declined.    ?Subjective:  ? ?Chief Complaint  ?Patient presents with  ? Hypertension  ? Annual Exam  ? ?HPI ?Teresa Hudson 46 y.o. female presents to office today for annual physical ? ?She has a past medical history of Anemia due to heavy menstrual cycles and now s/p Vaginal hysterectomy (06-2020), Chlamydia, COVID (12/2019), Fibroids, GERD,  Hypertension (2012 dx), LSIL (low grade squamous intraepithelial lesion) on Pap smear (01/24/2012), and Pneumonia (12/2019).   ? ?Endorses lower pelvic pain and pressure along with urinary frequency. Denies hematuria, dysuria, discharge or malodorous vaginal discharge.  ? ?HTN ?Endorses increased stress due to family issues right now. Forgot to take her bp meds for a week. Has recently resumed her amlodipine 10 mg. ?BP Readings from Last 3 Encounters:  ?08/05/21 134/87  ?07/09/21 130/79  ?05/21/21 130/84  ?  ?Review of Systems  ?Constitutional:  Negative for fever, malaise/fatigue and weight loss.  ?HENT: Negative.  Negative for nosebleeds.   ?Eyes: Negative.  Negative for blurred vision, double vision and photophobia.  ?Respiratory: Negative.  Negative for cough and shortness of breath.   ?Cardiovascular: Negative.  Negative for chest pain, palpitations and leg swelling.  ?Gastrointestinal:   Positive for abdominal pain. Negative for blood in stool, constipation, diarrhea, heartburn, melena, nausea and vomiting.  ?     SEE HPI  ?Genitourinary:  Positive for frequency. Negative for dysuria, flank pain, hematuria and urgency.  ?Musculoskeletal: Negative.  Negative for myalgias.  ?Skin: Negative.   ?Neurological: Negative.  Negative for dizziness, focal weakness, seizures and headaches.  ?Endo/Heme/Allergies: Negative.   ?Psychiatric/Behavioral: Negative.  Negative for suicidal ideas.   ? ?Past Medical History:  ?Diagnosis Date  ? Anemia 1997  ? Chlamydia   ? COVID 12/2019  ? fever, covid pneumonia, cough, chills loss of taste and smell body sches x 2 weeks all symptoms resolved  ? Fibroids   ? GERD (gastroesophageal reflux disease)   ? Heavy menses many years  ? History of recent blood transfusion 08/2019  ? 3 units   ? Hypertension 2012 dx  ? LSIL (low grade squamous intraepithelial lesion) on Pap smear 01/24/2012  ? Colpo 01/24/12  ? Pneumonia 12/2019  ? covid pneumonia  ? ? ?Past Surgical History:  ?Procedure Laterality Date  ? EPIDURAL BLOCK INJECTION  2012  ? did not work for childbirth  ? VAGINAL HYSTERECTOMY Bilateral 06/10/2020  ? Procedure: HYSTERECTOMY VAGINAL WITH bilateral SALPINGECTOMY  uterus = 586.5grams;  Surgeon: [name], MD;  Location: [facility];  Service: Gynecology;  Laterality: Bilateral;  ? wisdom tooth removal  yrs ago  ? ? ?Family History  ?Problem Relation Age of Onset  ? Diabetes Mother   ? Breast cancer Mother 60  ? Cancer Father   ?     prostate  ?  Hypertension Father   ? Asthma Sister   ? GI problems Sister   ? Thyroid cancer Sister   ? ? ?Social History Reviewed with no changes to be made today.  ? ?Outpatient Medications Prior to Visit  ?Medication Sig Dispense Refill  ? Adapalene (DIFFERIN) 0.3 % gel Apply once at bedtime.  If irritation occurs reduce frequency of bedtime application (Patient not taking: Reported on 07/09/2021) 90 g 0  ? amLODipine  (NORVASC) 10 MG tablet TAKE 1 TABLET (10 MG TOTAL) BY MOUTH DAILY. 90 tablet 1  ? Ascorbic Acid (VITAMIN C PO) Take 1 tablet by mouth daily. (Patient not taking: Reported on 02/25/2021)    ? omeprazole (PRILOSEC) 20 MG capsule Take 1 capsule (20 mg total) by mouth daily. (Patient not taking: Reported on 02/25/2021) 90 capsule 1  ? POTASSIUM PO Take 1 tablet by mouth daily. (Patient not taking: Reported on 02/06/2021)    ? sucralfate (CARAFATE) 1 g tablet Take 1 tablet (1 g total) by mouth 4 (four) times daily -  with meals and at bedtime. (Patient not taking: Reported on 02/06/2021) 30 tablet 0  ? ?No facility-administered medications prior to visit.  ? ? ?Allergies  ?Allergen Reactions  ? Covid-19 Mrna Vaccine [employer]) [phone] Mrna Vacc (Moderna)] Swelling  ? Sulfa Antibiotics Anaphylaxis  ? Aspirin Swelling  ? Latex Itching and Swelling  ? Penicillins Swelling and Other (See Comments)  ?  Facial swelling; occurred over 20 years ago.  ? Nickel Rash  ? ? ?   ?Objective:  ?  ?BP 134/87   Pulse (!) 102   Wt 204 lb 11.2 oz (92.9 kg)   LMP 06/04/2020   SpO2 100%   BMI 35.14 kg/m?  ?Wt Readings from Last 3 Encounters:  ?08/05/21 204 lb 11.2 oz (92.9 kg)  ?07/09/21 202 lb (91.6 kg)  ?05/21/21 204 lb 9.6 oz (92.8 kg)  ? ? ?Physical Exam ?Constitutional:   ?   Appearance: She is well-developed.  ?HENT:  ?   Head: Normocephalic and atraumatic.  ?   Right Ear: Hearing, tympanic membrane, ear canal and external ear normal.  ?   Left Ear: Hearing, tympanic membrane, ear canal and external ear normal.  ?   Nose: Nose normal.  ?   Right Turbinates: Not enlarged.  ?   Left Turbinates: Not enlarged.  ?   Mouth/Throat:  ?   Lips: Pink.  ?   Mouth: Mucous membranes are moist.  ?   Dentition: No dental tenderness, gingival swelling, dental abscesses or gum lesions.  ?   Pharynx: No oropharyngeal exudate.  ?Eyes:  ?   General: No scleral icterus.    ?   Right eye: No discharge.  ?   Extraocular Movements: Extraocular movements  intact.  ?   Conjunctiva/sclera: Conjunctivae normal.  ?   Pupils: Pupils are equal, round, and reactive to light.  ?Neck:  ?   Thyroid: No thyromegaly.  ?   Trachea: No tracheal deviation.  ?Cardiovascular:  ?   Rate and Rhythm: Normal rate and regular rhythm.  ?   Heart sounds: Normal heart sounds. No murmur heard. ?  No friction rub.  ?Pulmonary:  ?   Effort: Pulmonary effort is normal. No accessory muscle usage or respiratory distress.  ?   Breath sounds: Normal breath sounds. No decreased breath sounds, wheezing, rhonchi or rales.  ?Abdominal:  ?   General: Bowel sounds are normal. There is no distension.  ?   Palpations: Abdomen is  soft. There is no mass.  ?   Tenderness: There is no abdominal tenderness. There is no right CVA tenderness, left CVA tenderness, guarding or rebound.  ?   Hernia: No hernia is present.  ?Musculoskeletal:     ?   General: No tenderness or deformity. Normal range of motion.  ?   Cervical back: Normal range of motion and neck supple.  ?Lymphadenopathy:  ?   Cervical: No cervical adenopathy.  ?Skin: ?   General: Skin is warm and dry.  ?   Findings: No erythema.  ?Neurological:  ?   Mental Status: She is alert and oriented to person, place, and time.  ?   Cranial Nerves: No cranial nerve deficit.  ?   Motor: Motor function is intact.  ?   Coordination: Coordination is intact. Coordination normal.  ?   Gait: Gait is intact.  ?   Deep Tendon Reflexes:  ?   Reflex Scores: ?     Patellar reflexes are 1+ on the right side and 1+ on the left side. ?Psychiatric:     ?   Attention and Perception: Attention normal.     ?   Mood and Affect: Mood normal.     ?   Speech: Speech normal.     ?   Behavior: Behavior normal.     ?   Thought Content: Thought content normal.     ?   Judgment: Judgment normal.  ? ? ? ? ?   ?Patient has been counseled extensively about nutrition and exercise as well as the importance of adherence with medications and regular follow-up. The patient was given clear  instructions to go to ER or return to medical center if symptoms don't improve, worsen or new problems develop. The patient verbalized understanding.  ? ?Follow-up: Return in about 3 months (around 11/05/2021).  ? ?Gildardo Pounds,

## 2021-08-06 LAB — URINALYSIS, COMPLETE
Bilirubin, UA: NEGATIVE
Glucose, UA: NEGATIVE
Leukocytes,UA: NEGATIVE
Nitrite, UA: NEGATIVE
Protein,UA: NEGATIVE
RBC, UA: NEGATIVE
Specific Gravity, UA: 1.022 (ref 1.005–1.030)
Urobilinogen, Ur: 0.2 mg/dL (ref 0.2–1.0)
pH, UA: 5 (ref 5.0–7.5)

## 2021-08-06 LAB — CMP14+EGFR
ALT: 14 IU/L (ref 0–32)
AST: 20 IU/L (ref 0–40)
Albumin/Globulin Ratio: 1.5 (ref 1.2–2.2)
Albumin: 4.6 g/dL (ref 3.8–4.8)
Alkaline Phosphatase: 69 IU/L (ref 44–121)
BUN/Creatinine Ratio: 12 (ref 9–23)
BUN: 9 mg/dL (ref 6–24)
Bilirubin Total: 0.5 mg/dL (ref 0.0–1.2)
CO2: 22 mmol/L (ref 20–29)
Calcium: 9.7 mg/dL (ref 8.7–10.2)
Chloride: 101 mmol/L (ref 96–106)
Creatinine, Ser: 0.76 mg/dL (ref 0.57–1.00)
Globulin, Total: 3 g/dL (ref 1.5–4.5)
Glucose: 91 mg/dL (ref 70–99)
Potassium: 3.8 mmol/L (ref 3.5–5.2)
Sodium: 140 mmol/L (ref 134–144)
Total Protein: 7.6 g/dL (ref 6.0–8.5)
eGFR: 100 mL/min/{1.73_m2} (ref >59)

## 2021-08-06 LAB — CBC WITH DIFFERENTIAL/PLATELET
Basophils Absolute: 0 10*3/uL (ref 0.0–0.2)
Basos: 1 %
EOS (ABSOLUTE): 0.1 10*3/uL (ref 0.0–0.4)
Eos: 1 %
Hematocrit: 45.2 % (ref 34.0–46.6)
Hemoglobin: 15.1 g/dL (ref 11.1–15.9)
Immature Grans (Abs): 0 10*3/uL (ref 0.0–0.1)
Immature Granulocytes: 0 %
Lymphocytes Absolute: 2.1 10*3/uL (ref 0.7–3.1)
Lymphs: 32 %
MCH: 28.6 pg (ref 26.6–33.0)
MCHC: 33.4 g/dL (ref 31.5–35.7)
MCV: 86 fL (ref 79–97)
Monocytes Absolute: 0.7 10*3/uL (ref 0.1–0.9)
Monocytes: 11 %
Neutrophils Absolute: 3.6 10*3/uL (ref 1.4–7.0)
Neutrophils: 55 %
Platelets: 319 10*3/uL (ref 150–450)
RBC: 5.28 x10E6/uL (ref 3.77–5.28)
RDW: 13.4 % (ref 11.7–15.4)
WBC: 6.5 10*3/uL (ref 3.4–10.8)

## 2021-08-06 LAB — MICROSCOPIC EXAMINATION
Casts: NONE SEEN /lpf
Epithelial Cells (non renal): >10 /hpf — AB (ref 0–10)

## 2021-08-12 ENCOUNTER — Other Ambulatory Visit: Payer: Self-pay | Admitting: Nurse Practitioner

## 2021-08-12 DIAGNOSIS — R399 Unspecified symptoms and signs involving the genitourinary system: Secondary | ICD-10-CM

## 2021-08-12 DIAGNOSIS — R350 Frequency of micturition: Secondary | ICD-10-CM

## 2021-08-12 MED ORDER — NITROFURANTOIN MONOHYD MACRO 100 MG PO CAPS: 100.0000 mg | ORAL_CAPSULE | Freq: Two times a day (BID) | ORAL | 0 refills | Status: AC

## 2021-08-13 ENCOUNTER — Encounter: Payer: Self-pay | Admitting: Nurse Practitioner

## 2021-08-24 IMAGING — CR DG CHEST 2V
2 series · 2 of 2 positions shown · non-contrast
Comparison: 07/29/2013

CLINICAL DATA: Heart palpitations

EXAM:
CHEST - 2 VIEW

[w chest pa]
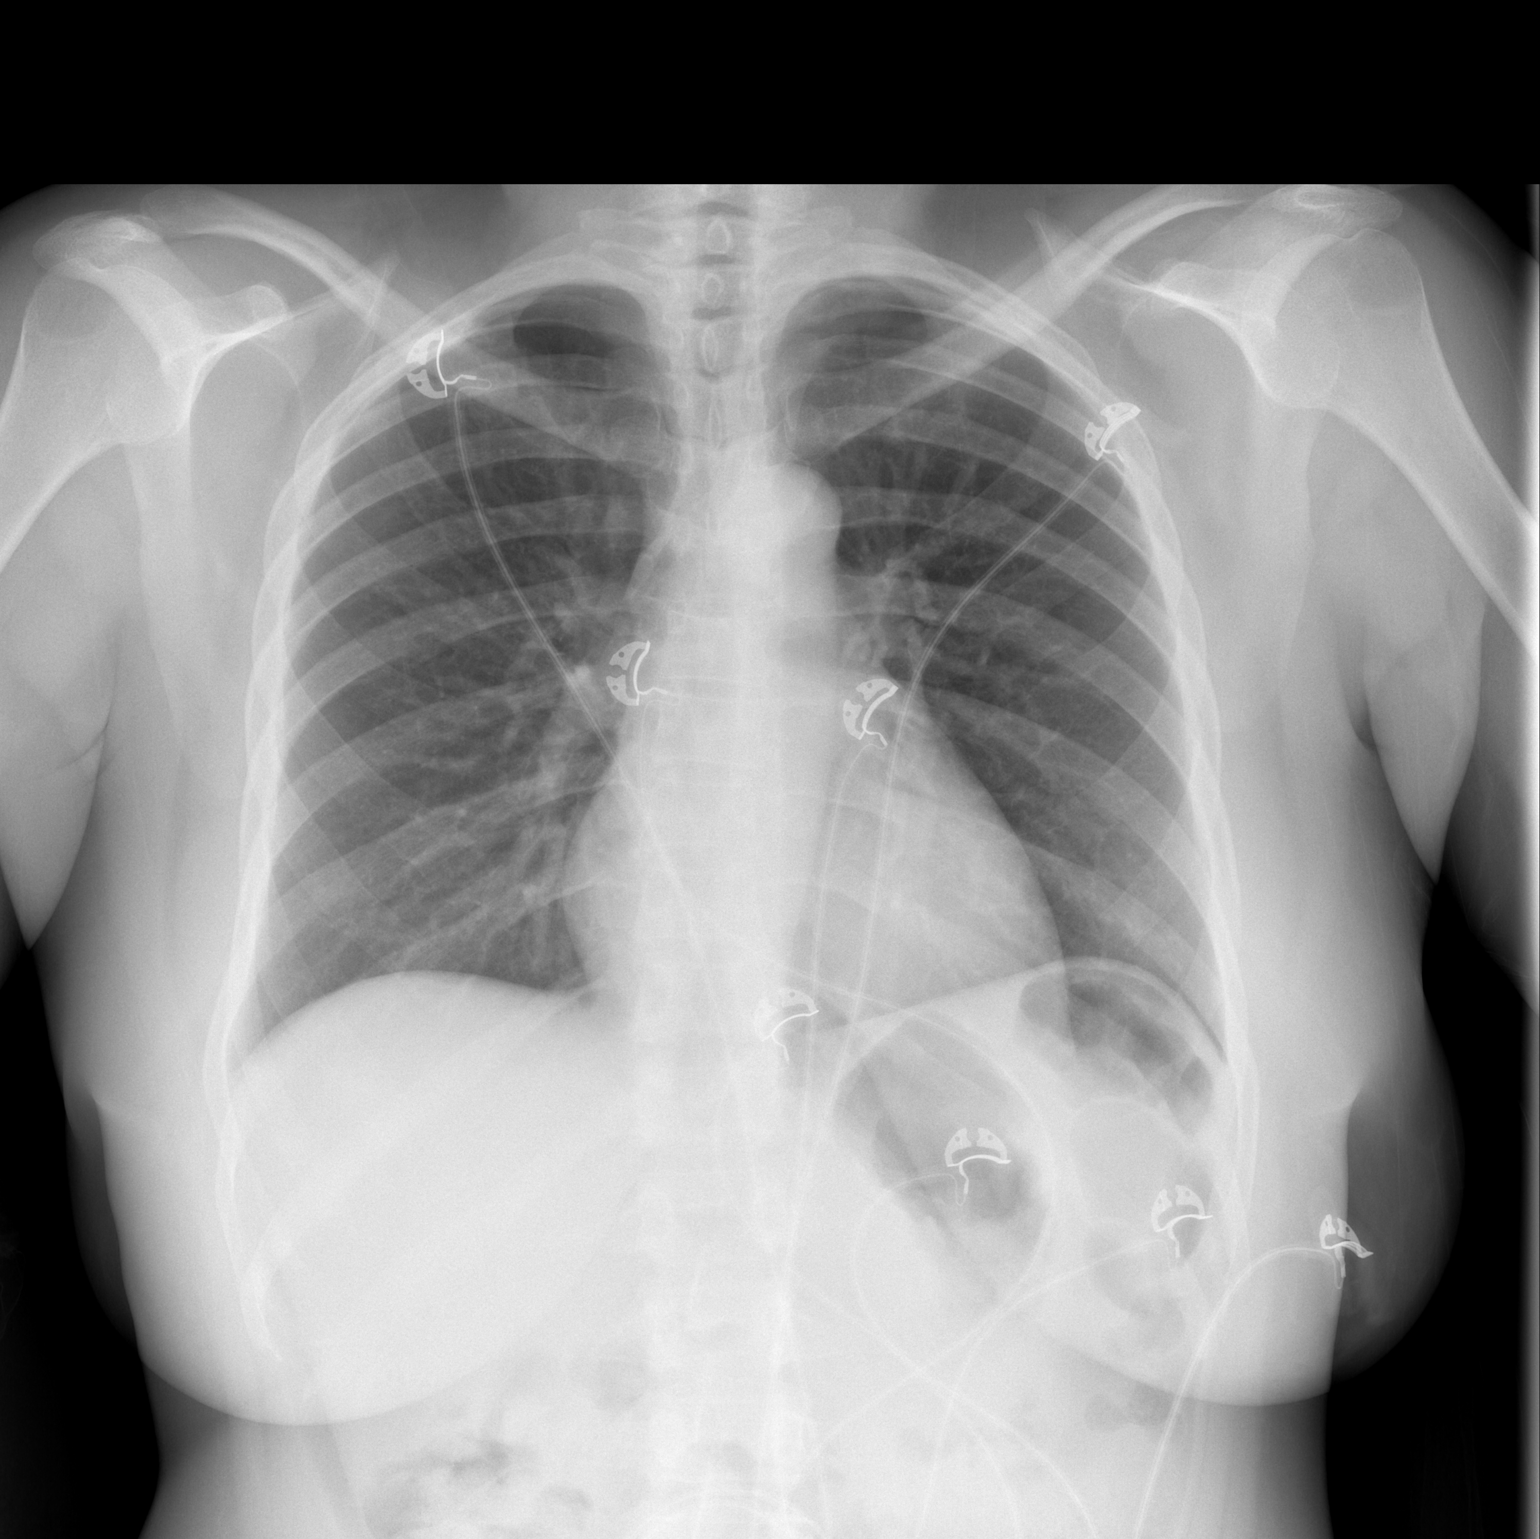

[w chest lat]
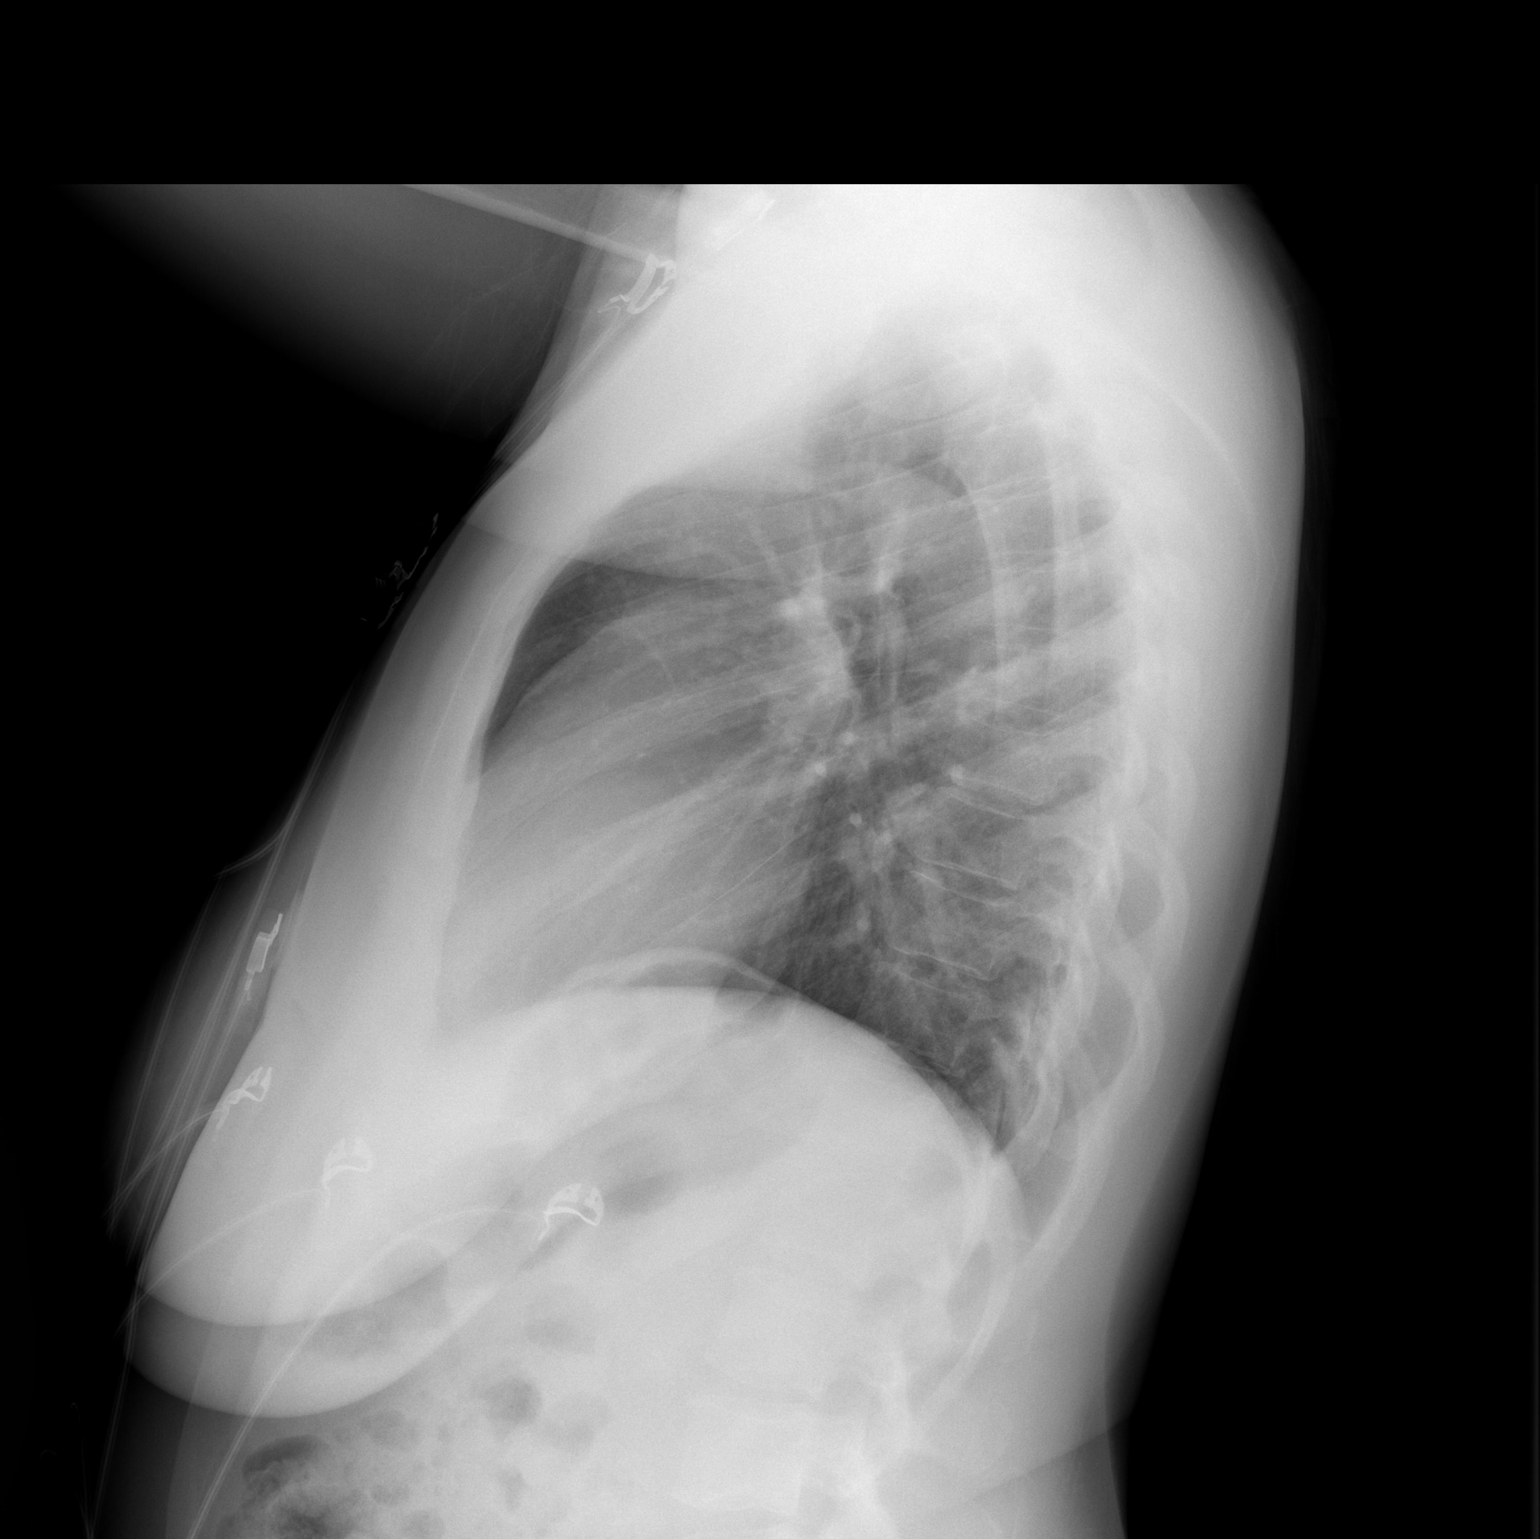

[2 of 2 positions shown; findings below may reference images not displayed]

FINDINGS: The heart size and mediastinal contours are within normal limits.
Both lungs are clear. The visualized skeletal structures are
unremarkable.
IMPRESSION: No active cardiopulmonary disease.

## 2021-09-10 ENCOUNTER — Inpatient Hospital Stay: Admission: RE | Admit: 2021-09-10 | Payer: No Typology Code available for payment source | Source: Ambulatory Visit

## 2021-10-08 ENCOUNTER — Ambulatory Visit
Admission: RE | Admit: 2021-10-08 | Discharge: 2021-10-08 | Disposition: A | Discharge status: Home / Self Care | Payer: No Typology Code available for payment source | Source: Ambulatory Visit | Attending: Nurse Practitioner | Admitting: Nurse Practitioner

## 2021-10-08 DIAGNOSIS — Z1231 Encounter for screening mammogram for malignant neoplasm of breast: Secondary | ICD-10-CM

## 2021-10-13 ENCOUNTER — Other Ambulatory Visit: Payer: Self-pay | Admitting: Obstetrics and Gynecology

## 2021-10-13 DIAGNOSIS — R928 Other abnormal and inconclusive findings on diagnostic imaging of breast: Secondary | ICD-10-CM

## 2021-11-02 ENCOUNTER — Ambulatory Visit: Payer: Self-pay | Admitting: Nurse Practitioner

## 2021-11-03 ENCOUNTER — Encounter: Payer: Self-pay | Admitting: Nurse Practitioner

## 2021-11-03 ENCOUNTER — Ambulatory Visit: Payer: Self-pay | Attending: Nurse Practitioner | Admitting: Nurse Practitioner

## 2021-11-03 ENCOUNTER — Other Ambulatory Visit: Payer: Self-pay | Admitting: Nurse Practitioner

## 2021-11-03 DIAGNOSIS — R8290 Unspecified abnormal findings in urine: Secondary | ICD-10-CM

## 2021-11-03 DIAGNOSIS — I10 Essential (primary) hypertension: Secondary | ICD-10-CM

## 2021-11-03 MED ORDER — AMLODIPINE BESYLATE 10 MG PO TABS: ORAL_TABLET | Freq: Every day | ORAL | 1 refills | Status: DC

## 2021-11-05 ENCOUNTER — Ambulatory Visit: Payer: Self-pay | Admitting: *Deleted

## 2021-11-05 VITALS — BP 134/86 | Wt 204.8 lb

## 2021-11-05 DIAGNOSIS — Z1239 Encounter for other screening for malignant neoplasm of breast: Secondary | ICD-10-CM

## 2021-11-05 NOTE — Patient Instructions (Signed)
Explained breast self awareness with Angeleen T Hutto. Patient did not need a Pap smear today due to patient has a history of a hysterectomy for benign reasons. Let patient know that she doesn't need any further Pap smears due to her history of a hysterectomy for benign reasons. Referred patient to the [facility] for a left breast diagnostic mammogram per recommendation. Appointment scheduled Thursday, November 12, 2021 at 1530. Patient aware of appointment and will be there. Dorene T Marandola verbalized understanding.  Geralyn Figiel, Arvil Chaco, RN 3:16 PM

## 2021-11-05 NOTE — Progress Notes (Signed)
[name] is a 46 y.o. female who presents to [facility] clinic today with no complaints. Patient referred to [facility] by the [facility] due to having a screening mammogram completed 10/08/2021 that additional imaging of the left breast is recommended for follow up.   Pap Smear: Pap smear not completed today. Last Pap smear was 09/13/2019 at [facility] and was normal with negative HPV. Per patient has history of an abnormal Pap smear in 2013 that a colposcopy was completed for follow up. Patient stated has had at least three normal Pap smears since colposcopy. Patient has a history of a hysterectomy for fibroids 06/10/2020. Patient doesn't need any further Pap smears due to her history of a hysterectomy for benign reasons per BCCCP and ASCCP guidelines. Last Pap smear result is available in Epic.  Physical exam: Breasts Breasts symmetrical. No skin abnormalities bilateral breasts. No nipple retraction bilateral breasts. No nipple discharge bilateral breasts. No lymphadenopathy. No lumps palpated bilateral breasts. No complaints of pain or tenderness on exam.      MS DIGITAL SCREENING TOMO BILATERAL  Result Date: 10/12/2021 CLINICAL DATA:  Screening. EXAM: DIGITAL SCREENING BILATERAL MAMMOGRAM WITH TOMOSYNTHESIS AND CAD TECHNIQUE: Bilateral screening digital craniocaudal and mediolateral oblique mammograms were obtained. Bilateral screening digital breast tomosynthesis was performed. The images were evaluated with computer-aided detection. COMPARISON:  Previous exam(s). ACR Breast Density Category b: There are scattered areas of fibroglandular density. FINDINGS: In the left breast, a possible mass in the lower inner breast at middle depth warrants further evaluation. In the right breast, no findings suspicious for malignancy. IMPRESSION: Further evaluation is suggested for possible mass in the left breast. RECOMMENDATION: Ultrasound of the left breast.  (Code:US-L-72M) The patient will be contacted regarding the findings, and additional imaging will be scheduled. BI-RADS CATEGORY  0: Incomplete. Need additional imaging evaluation and/or prior mammograms for comparison. Electronically Signed   By: Ileana Roup M.D.   On: 10/12/2021 14:37   MS DIGITAL SCREENING TOMO BILATERAL  Result Date: 11/08/2019 CLINICAL DATA:  Screening. EXAM: DIGITAL SCREENING BILATERAL MAMMOGRAM WITH TOMO AND CAD COMPARISON:  Previous exam(s). ACR Breast Density Category c: The breast tissue is heterogeneously dense, which may obscure small masses. FINDINGS: There are no findings suspicious for malignancy. Images were processed with CAD. IMPRESSION: No mammographic evidence of malignancy. A result letter of this screening mammogram will be mailed directly to the patient. RECOMMENDATION: Screening mammogram in one year. (Code:SM-B-01Y) BI-RADS CATEGORY  1: Negative. Electronically Signed   By: Everlean Alstrom M.D.   On: 11/08/2019 16:23    Pelvic/Bimanual Pap is not indicated today per BCCCP guidelines.    Smoking History: Patient has never smoked.   Patient Navigation: Patient education provided. Access to services provided for patient through BCCCP program.   Breast and Cervical Cancer Risk Assessment: Patient has family history of her mother having breast cancer. Patient has no known genetic mutations or history of radiation treatment to the chest before age 41. Per patient has history of cervical dysplasia. Patient has no history of being immunocompromised or DES exposure in-utero.  Risk Assessment     Risk Scores       11/05/2021 11/06/2019   Last edited by: Demetrius Revel, LPN McGill, Sherie Mamie Nick, LPN   5-year risk: 1.2 % 1 %   Lifetime risk: 14.7 % 14.9 %            A: BCCCP exam without pap smear No complaints.  P: Referred patient to the [facility] for a left breast diagnostic mammogram per recommendation. Appointment scheduled  Thursday, November 12, 2021 at 1530.  Loletta Parish, RN 11/05/2021 3:16 PM

## 2021-11-12 ENCOUNTER — Ambulatory Visit
Admission: RE | Admit: 2021-11-12 | Discharge: 2021-11-12 | Disposition: A | Discharge status: Home / Self Care | Payer: No Typology Code available for payment source | Source: Ambulatory Visit | Attending: Obstetrics and Gynecology | Admitting: Obstetrics and Gynecology

## 2021-11-12 ENCOUNTER — Other Ambulatory Visit: Payer: Self-pay | Admitting: Obstetrics and Gynecology

## 2021-11-12 DIAGNOSIS — R928 Other abnormal and inconclusive findings on diagnostic imaging of breast: Secondary | ICD-10-CM

## 2021-11-12 DIAGNOSIS — N6320 Unspecified lump in the left breast, unspecified quadrant: Secondary | ICD-10-CM

## 2021-11-18 ENCOUNTER — Ambulatory Visit
Admission: RE | Admit: 2021-11-18 | Discharge: 2021-11-18 | Disposition: A | Discharge status: Home / Self Care | Payer: No Typology Code available for payment source | Source: Ambulatory Visit | Attending: Obstetrics and Gynecology | Admitting: Obstetrics and Gynecology

## 2021-11-18 DIAGNOSIS — N6320 Unspecified lump in the left breast, unspecified quadrant: Secondary | ICD-10-CM

## 2022-06-20 ENCOUNTER — Emergency Department (HOSPITAL_COMMUNITY): Payer: Medicaid Other

## 2022-06-20 ENCOUNTER — Emergency Department (HOSPITAL_COMMUNITY)
Admission: EM | Admit: 2022-06-20 | Discharge: 2022-06-20 | Disposition: A | Discharge status: Home / Self Care | Payer: Medicaid Other | Attending: Emergency Medicine | Admitting: Emergency Medicine

## 2022-06-20 ENCOUNTER — Other Ambulatory Visit: Payer: Self-pay

## 2022-06-20 DIAGNOSIS — R079 Chest pain, unspecified: Secondary | ICD-10-CM | POA: Diagnosis not present

## 2022-06-20 DIAGNOSIS — R141 Gas pain: Secondary | ICD-10-CM | POA: Diagnosis not present

## 2022-06-20 DIAGNOSIS — Z91040 Latex allergy status: Secondary | ICD-10-CM | POA: Insufficient documentation

## 2022-06-20 DIAGNOSIS — R140 Abdominal distension (gaseous): Secondary | ICD-10-CM | POA: Diagnosis not present

## 2022-06-20 DIAGNOSIS — I10 Essential (primary) hypertension: Secondary | ICD-10-CM | POA: Diagnosis not present

## 2022-06-20 DIAGNOSIS — R0789 Other chest pain: Secondary | ICD-10-CM | POA: Diagnosis not present

## 2022-06-20 DIAGNOSIS — R110 Nausea: Secondary | ICD-10-CM | POA: Diagnosis not present

## 2022-06-20 LAB — COMPREHENSIVE METABOLIC PANEL
ALT: 32 U/L (ref 0–44)
AST: 28 U/L (ref 15–41)
Albumin: 3.5 g/dL (ref 3.5–5.0)
Alkaline Phosphatase: 66 U/L (ref 38–126)
Anion gap: 11 (ref 5–15)
BUN: 6 mg/dL (ref 6–20)
CO2: 22 mmol/L (ref 22–32)
Calcium: 8.7 mg/dL — ABNORMAL LOW (ref 8.9–10.3)
Chloride: 104 mmol/L (ref 98–111)
Creatinine, Ser: 0.81 mg/dL (ref 0.44–1.00)
GFR, Estimated: >60 mL/min (ref >60)
Glucose, Bld: 102 mg/dL — ABNORMAL HIGH (ref 70–99)
Potassium: 3.7 mmol/L (ref 3.5–5.1)
Sodium: 137 mmol/L (ref 135–145)
Total Bilirubin: 0.3 mg/dL (ref 0.3–1.2)
Total Protein: 7.3 g/dL (ref 6.5–8.1)

## 2022-06-20 LAB — CBC
HCT: 45.8 % (ref 36.0–46.0)
Hemoglobin: 15.1 g/dL — ABNORMAL HIGH (ref 12.0–15.0)
MCH: 29.3 pg (ref 26.0–34.0)
MCHC: 33 g/dL (ref 30.0–36.0)
MCV: 88.8 fL (ref 80.0–100.0)
Platelets: 273 10*3/uL (ref 150–400)
RBC: 5.16 MIL/uL — ABNORMAL HIGH (ref 3.87–5.11)
RDW: 13.7 % (ref 11.5–15.5)
WBC: 6.2 10*3/uL (ref 4.0–10.5)
nRBC: 0 % (ref 0.0–0.2)

## 2022-06-20 LAB — TROPONIN I (HIGH SENSITIVITY): Troponin I (High Sensitivity): <2 ng/L (ref <18)

## 2022-06-20 NOTE — ED Triage Notes (Signed)
Patient was lying in bed has nausea, then sharp chest pain, then upon EMS arrival she burped and felt great.  Denies pain in route with EMS.

## 2022-06-20 NOTE — Discharge Instructions (Addendum)
Please follow-up with your primary care doctor, you can take Gas-X for gas pain.  Please return to the ER if you have severe chest pain, shortness of breath, or blood in your sputum.  Your labs were reassuring today, and that your chest pain is likely secondary to some gas that you are able to burp up.

## 2022-06-20 NOTE — ED Provider Notes (Signed)
[facility] Provider Note   CSN: [id number] Arrival date & time: 06/20/22  1704     History  Chief Complaint  Patient presents with   Chest Pain    Nausea    Teresa Hudson is a 47 y.o. female, history of hypertension, hysterectomy, who presents to the ED secondary to chest pain that occurred earlier today, that is now resolved.  She states she was lying in the bed, and start having some sternal chest pain that was sharp in nature, not associated with any shortness of breath, but associated with nausea.  Pain progressively got worse, and it was severe, so she called EMS that she is concerned she may have been having a heart attack.  She denies any heart history, other than high blood pressure.  She is notes that once EMS arrived, she burped, and she had relief of her pain.  Denies any kind of vomiting, confusion, trauma to the chest.  Pain is a 0 out of 10.    Home Medications Prior to Admission medications   Medication Sig Start Date End Date Taking? Authorizing Provider  Adapalene (DIFFERIN) 0.3 % gel Apply once at bedtime.  If irritation occurs reduce frequency of bedtime application Patient not taking: Reported on 07/09/2021 06/05/21   Gildardo Pounds, NP  amLODipine (NORVASC) 10 MG tablet TAKE 1 TABLET (10 MG TOTAL) BY MOUTH DAILY. 11/03/21 11/03/22  Gildardo Pounds, NP  senna-docusate (SENOKOT-S) 8.6-50 MG tablet Take 2 tablets by mouth at bedtime. 08/05/21   Gildardo Pounds, NP  albuterol (VENTOLIN HFA) 108 (90 Base) MCG/ACT inhaler Inhale 2 puffs into the lungs every 4 (four) hours as needed for wheezing or shortness of breath. Patient not taking: Reported on 02/01/2020 01/06/20 04/18/20  Hall-Potvin, [place], PA-C  fluticasone (FLONASE) 50 MCG/ACT nasal spray Place 1 spray into both nostrils daily. Patient not taking: Reported on 02/01/2020 01/06/20 04/18/20  Hall-Potvin, [place], PA-C  hydrochlorothiazide (HYDRODIURIL) 12.5 MG tablet  Take 1 tablet (12.5 mg total) by mouth daily. Patient not taking: Reported on 11/30/2017 06/25/16 08/26/19  Boykin Nearing, MD  potassium chloride SA (K-DUR,KLOR-CON) 20 MEQ tablet Take 1 tablet (20 mEq total) by mouth 2 (two) times daily. Patient not taking: Reported on 11/30/2017 03/03/13 08/26/19  Randal Buba, April, MD      Allergies    Covid-19 mrna vaccine Tomma Rakers) [covid-19 mrna vacc (moderna)], Sulfa antibiotics, Aspirin, Latex, Penicillins, and Nickel    Review of Systems   Review of Systems  Respiratory:  Negative for shortness of breath.   Cardiovascular:  Positive for chest pain.    Physical Exam Updated Vital Signs BP (!) 154/98 (BP Location: Right Arm)   Pulse 90   Resp (!) 21   Ht 5' 4"$  (1.626 m)   Wt 95.3 kg   LMP 06/04/2020   SpO2 100%   BMI 36.05 kg/m  Physical Exam Vitals and nursing note reviewed.  Constitutional:      General: She is not in acute distress.    Appearance: She is well-developed.  HENT:     Head: Normocephalic and atraumatic.  Eyes:     Conjunctiva/sclera: Conjunctivae normal.  Cardiovascular:     Rate and Rhythm: Normal rate and regular rhythm.     Heart sounds: No murmur heard. Pulmonary:     Effort: Pulmonary effort is normal. No respiratory distress.     Breath sounds: Normal breath sounds.  Abdominal:     Palpations: Abdomen is soft.  Tenderness: There is no abdominal tenderness.  Musculoskeletal:        General: No swelling.     Cervical back: Neck supple.  Skin:    General: Skin is warm and dry.     Capillary Refill: Capillary refill takes less than 2 seconds.  Neurological:     Mental Status: She is alert.  Psychiatric:        Mood and Affect: Mood normal.     ED Results / Procedures / Treatments   Labs (all labs ordered are listed, but only abnormal results are displayed) Labs Reviewed  CBC - Abnormal; Notable for the following components:      Result Value   RBC 5.16 (*)    Hemoglobin 15.1 (*)    All other  components within normal limits  COMPREHENSIVE METABOLIC PANEL - Abnormal; Notable for the following components:   Glucose, Bld 102 (*)    Calcium 8.7 (*)    All other components within normal limits  TROPONIN I (HIGH SENSITIVITY)    EKG EKG Interpretation  Date/Time:  Sunday June 20 2022 17:18:53 EST Ventricular Rate:  93 PR Interval:  154 QRS Duration: 82 QT Interval:  308 QTC Calculation: 383 R Axis:   -24 Text Interpretation: Sinus rhythm Borderline left axis deviation Borderline T wave abnormalities Confirmed by Lennice Sites ([id number]) on 06/20/2022 5:20:26 PM  Radiology DG Chest 2 View  Result Date: 06/20/2022 CLINICAL DATA:  Chest pain EXAM: CHEST - 2 VIEW COMPARISON:  Chest x-ray November 27, 2020 FINDINGS: The cardiomediastinal silhouette is unchanged in contour. No focal pulmonary opacity. No pleural effusion or pneumothorax. The visualized upper abdomen is unremarkable. No acute osseous abnormality. IMPRESSION: No acute cardiopulmonary abnormality. Electronically Signed   By: Beryle Flock M.D.   On: 06/20/2022 18:18    Procedures Procedures    Medications Ordered in ED Medications - No data to display  ED Course/ Medical Decision Making/ A&P             HEART Score: 1                Medical Decision Making Patient is a 47 year old female, here for chest pain, she has no shortness of breath, chest pain is relieved after burping.  We will obtain EKG, troponin secondary to her concern then that this may be Cardiac in origin.  She currently has no chest pain.  No nausea, vomiting.  History of hypertension, no coronary artery disease to her knowledge.  Amount and/or Complexity of Data Reviewed Labs: ordered.    Details: Troponin within normal limits Radiology: ordered.    Details: Chest x-ray clear ECG/medicine tests:  Decision-making details documented in ED Course. Discussion of management or test interpretation with external provider(s): Discussed with patient,  heart score 1, first troponin within normal limits, she is low risk, and she is feeling better pain is resolved, we will have her follow-up with PCP, I believe that her pain is likely secondary to gas pain, and we discussed return precautions.   Final Clinical Impression(s) / ED Diagnoses Final diagnoses:  Chest pain, unspecified type  Gas pain    Rx / DC Orders ED Discharge Orders     None         Adilene Areola, Si Ga[place], PA 06/20/22 1848    Lennice Sites, DO 06/20/22 1911

## 2022-07-28 ENCOUNTER — Encounter: Payer: Self-pay | Admitting: Dermatology

## 2022-07-28 ENCOUNTER — Ambulatory Visit: Payer: Medicaid Other | Admitting: Dermatology

## 2022-07-28 DIAGNOSIS — L709 Acne, unspecified: Secondary | ICD-10-CM

## 2022-07-28 DIAGNOSIS — L730 Acne keloid: Secondary | ICD-10-CM

## 2022-07-28 DIAGNOSIS — L905 Scar conditions and fibrosis of skin: Secondary | ICD-10-CM

## 2022-07-28 MED ORDER — SPIRONOLACTONE 100 MG PO TABS: 100.0000 mg | ORAL_TABLET | Freq: Every day | ORAL | 1 refills | Status: AC

## 2022-07-28 MED ORDER — TRETINOIN 0.05 % EX CREA: TOPICAL_CREAM | Freq: Every day | CUTANEOUS | 0 refills | Status: AC

## 2022-07-28 NOTE — Progress Notes (Signed)
   New Patient Visit  Subjective  Teresa Hudson is a 47 y.o. female who presents for the following: Acne (Has been dealing with it since late teens. Morning and night routine: natural soap or Neutragena . If face feels dry will use cereve. Has been prescribed Clindamycin when younger, tried Differin and another cream but not consistent.   ).    Objective  Well appearing patient in no apparent distress; mood and affect are within normal limits.  A focused examination was performed including face. Relevant physical exam findings are noted in the Assessment and Plan.  Head - Anterior (Face) Follicularly based papules and pustules with comedones   Head - Anterior (Face) Box car scarring on areas of prior acne lesions   Assessment & Plan  Acne, unspecified acne type Head - Anterior (Face)  Wash face in morning  Wash face at night dry, apply hyaluronic acid, and apply a peasized amount, if skin is dry apply moisturizer.   Take 100mg  of Spironolactone daily   tretinoin (RETIN-A) 0.05 % cream - Head - Anterior (Face) Apply topically at bedtime. Apply a pea sized amount topically to the face three nights a week  spironolactone (ALDACTONE) 100 MG tablet - Head - Anterior (Face) Take 1 tablet (100 mg total) by mouth daily.  Acne scarring Head - Anterior (Face)  Counseling: -Explained to patient that acnes scars can improve with topical retinoids -We will continue to increase strength of topical retinoids over time as tolerated -Once acne is under control we will discuss laser or microneedling as long term treatment options.    Return in about 3 months (around 10/28/2022) for Acne follow up.

## 2022-07-28 NOTE — Patient Instructions (Addendum)
Daily Skincare Regimen: Patient Handout  Morning Routine:  Cleanse: Start your day by washing your face with a gentle cleanser. Choose a product suitable for your skin type, such as CeraVe, Neutrogena, Cetaphil, or LaRoche-Posay. Gently massage the cleanser onto damp skin, then rinse thoroughly with lukewarm water and pat dry with a clean towel.   Moisturize: Finish your morning routine by applying a moisturizer to your face and neck. Opt for a moisturizer that has SPF included, is suitable for your skin type and provides hydration without clogging pores. Consider brands like CeraVe, Neutrogena, Cetaphil, or LaRoche-Posay for effective hydration and skin barrier support.  Evening Routine:  Cleanse: Before bed, cleanse your face again with a gentle cleanser to remove makeup, dirt, and impurities accumulated throughout the day. Use the same cleanser as in the morning and follow the same steps for cleansing.  Apply Medication: Ensure that your skin is completely dry before applying any topical treatments to maximize their efficacy.  Apply prescription medication Tretinoin    Moisturize: After applying any medications, moisturize your skin to seal in hydration and support skin barrier function. Choose a moisturizer suitable for nighttime use that helps replenish moisture overnight. Look for products from trusted brands like CeraVe, Neutrogena, Cetaphil, or LaRoche-Posay for optimal results.   Additional Tips:  Sun Protection: During the daytime, it is essential to apply a broad-spectrum sunscreen with an SPF of 30 or higher to protect your skin from harmful UV rays. Apply sunscreen as the last step of your morning skincare routine and reapply throughout the day as needed, especially if you will be spending time outdoors.  Hydration: Drink plenty of water throughout the day to keep your skin hydrated from within.  Healthy Lifestyle:  Maintain a balanced diet, get regular exercise, manage stress, and prioritize adequate sleep to support overall skin health.   Following a consistent daily skincare regimen can help promote healthy, radiant skin and minimize the risk of common skin issues. Be patient and consistent with your routine, and remember to listen to your skin's needs   Due to recent changes in healthcare laws, you may see results of your pathology and/or laboratory studies on MyChart before the doctors have had a chance to review them. We understand that in some cases there may be results that are confusing or concerning to you. Please understand that not all results are received at the same time and often the doctors may need to interpret multiple results in order to provide you with the best plan of care or course of treatment. Therefore, we ask that you please give [place] 2 business days to thoroughly review all your results before contacting the office for clarification. Should we see a critical lab result, you will be contacted sooner.   If You Need Anything After Your Visit  If you have any questions or concerns for your doctor, please call our main line at [phone] If no one answers, please leave a voicemail as directed and we will return your call as soon as possible. Messages left after 4 pm will be answered the following business day.   You may also send [place] a message via [facility]. We typically respond to MyChart messages within 1-2 business days.  For prescription refills, please ask your pharmacy to contact our office. Our fax number is [phone].  If you have an urgent issue when the clinic is closed that cannot wait until the next business day, you can page your doctor at the number below.  Please note that while we do our best to be available for urgent issues outside of office hours, we are not available 24/7.   If you have an urgent issue and are unable to reach [place], you may choose to seek medical  care at your doctor's office, retail clinic, urgent care center, or emergency room.  If you have a medical emergency, please immediately call 911 or go to the emergency department. In the event of inclement weather, please call our main line at [phone] for an update on the status of any delays or closures.  Dermatology Medication Tips: Please keep the boxes that topical medications come in in order to help keep track of the instructions about where and how to use these. Pharmacies typically print the medication instructions only on the boxes and not directly on the medication tubes.   If your medication is too expensive, please contact our office at [phone] or send [place] a message through [facility].   We are unable to tell what your co-pay for medications will be in advance as this is different depending on your insurance coverage. However, we may be able to find a substitute medication at lower cost or fill out paperwork to get insurance to cover a needed medication.   If a prior authorization is required to get your medication covered by your insurance company, please allow [place] 1-2 business days to complete this process.  Drug prices often vary depending on where the prescription is filled and some pharmacies may offer cheaper prices.  The website www.goodrx.com contains coupons for medications through different pharmacies. The prices here do not account for what the cost may be with help from insurance (it may be cheaper with your insurance), but the website can give you the price if you did not use any insurance.  - You can print the associated coupon and take it with your prescription to the pharmacy.  - You may also stop by our office during regular business hours and pick up a GoodRx coupon card.  - If you need your prescription sent electronically to a different pharmacy, notify our office through [facility] or by phone at [phone]

## 2022-08-04 ENCOUNTER — Ambulatory Visit: Payer: No Typology Code available for payment source | Admitting: Nurse Practitioner

## 2022-09-06 ENCOUNTER — Other Ambulatory Visit: Payer: Self-pay | Admitting: Nurse Practitioner

## 2022-09-06 ENCOUNTER — Ambulatory Visit: Payer: Medicaid Other | Attending: Nurse Practitioner | Admitting: Nurse Practitioner

## 2022-09-06 ENCOUNTER — Encounter: Payer: Self-pay | Admitting: Nurse Practitioner

## 2022-09-06 VITALS — BP 130/85 | HR 100 | Ht 64.0 in | Wt 211.8 lb

## 2022-09-06 DIAGNOSIS — R7989 Other specified abnormal findings of blood chemistry: Secondary | ICD-10-CM | POA: Diagnosis not present

## 2022-09-06 DIAGNOSIS — D72829 Elevated white blood cell count, unspecified: Secondary | ICD-10-CM

## 2022-09-06 DIAGNOSIS — I10 Essential (primary) hypertension: Secondary | ICD-10-CM

## 2022-09-06 DIAGNOSIS — Z1231 Encounter for screening mammogram for malignant neoplasm of breast: Secondary | ICD-10-CM

## 2022-09-06 DIAGNOSIS — E669 Obesity, unspecified: Secondary | ICD-10-CM

## 2022-09-06 DIAGNOSIS — E7800 Pure hypercholesterolemia, unspecified: Secondary | ICD-10-CM

## 2022-09-06 MED ORDER — AMLODIPINE BESYLATE 10 MG PO TABS: ORAL_TABLET | Freq: Every day | ORAL | 1 refills | Status: DC

## 2022-09-06 NOTE — Progress Notes (Unsigned)
Assessment & Plan:  Diagnoses and all orders for this visit:  Primary hypertension -     CMP14+EGFR  Leukocytosis, unspecified type -     CBC with Differential  Hypercholesterolemia -     Lipid panel    Patient has been counseled on age-appropriate routine health concerns for screening and prevention. These are reviewed and up-to-date. Referrals have been placed accordingly. Immunizations are up-to-date or declined.    Subjective:  No chief complaint on file.  HPI Teresa Hudson 47 y.o. female presents to office today   ROS  Past Medical History:  Diagnosis Date   Anemia 1997   Chlamydia    COVID 12/2019   fever, covid pneumonia, cough, chills loss of taste and smell body sches x 2 weeks all symptoms resolved   Fibroids    GERD (gastroesophageal reflux disease)    Heavy menses many years   History of recent blood transfusion 08/2019   3 units    Hypertension 2012 dx   LSIL (low grade squamous intraepithelial lesion) on Pap smear 01/24/2012   Colpo 01/24/12   Pneumonia 12/2019   covid pneumonia    Past Surgical History:  Procedure Laterality Date   EPIDURAL BLOCK INJECTION  2012   did not work for childbirth   VAGINAL HYSTERECTOMY Bilateral 06/10/2020   Procedure: HYSTERECTOMY VAGINAL WITH bilateral SALPINGECTOMY  uterus = 586.5grams;  Surgeon: Reva Bores, MD;  Location: [facility] [place];  Service: Gynecology;  Laterality: Bilateral;   wisdom tooth removal  yrs ago    Family History  Problem Relation Age of Onset   Diabetes Mother    Breast cancer Mother 75   Cancer Father        prostate   Hypertension Father    Asthma Sister    GI problems Sister    Thyroid cancer Sister     Social History Reviewed with no changes to be made today.   Outpatient Medications Prior to Visit  Medication Sig Dispense Refill   Adapalene (DIFFERIN) 0.3 % gel Apply once at bedtime.  If irritation occurs reduce frequency of bedtime application (Patient not  taking: Reported on 07/09/2021) 90 g 0   amLODipine (NORVASC) 10 MG tablet TAKE 1 TABLET (10 MG TOTAL) BY MOUTH DAILY. 90 tablet 1   senna-docusate (SENOKOT-S) 8.6-50 MG tablet Take 2 tablets by mouth at bedtime. 60 tablet 6   spironolactone (ALDACTONE) 100 MG tablet Take 1 tablet (100 mg total) by mouth daily. 90 tablet 1   tretinoin (RETIN-A) 0.05 % cream Apply topically at bedtime. Apply a pea sized amount topically to the face three nights a week 45 g 0   No facility-administered medications prior to visit.    Allergies  Allergen Reactions   Covid-19 Mrna Vaccine [employer]) [Covid-19 Mrna Vacc (Moderna)] Swelling   Sulfa Antibiotics Anaphylaxis   Aspirin Swelling   Latex Itching and Swelling   Penicillins Swelling and Other (See Comments)    Facial swelling; occurred over 20 years ago.   Nickel Rash       Objective:    LMP 06/04/2020  Wt Readings from Last 3 Encounters:  06/20/22 210 lb (95.3 kg)  11/05/21 204 lb 12.8 oz (92.9 kg)  08/05/21 204 lb 11.2 oz (92.9 kg)    Physical Exam       Patient has been counseled extensively about nutrition and exercise as well as the importance of adherence with medications and regular follow-up. The patient was given clear instructions to  go to ER or return to medical center if symptoms don't improve, worsen or new problems develop. The patient verbalized understanding.   Follow-up: No follow-ups on file.   Claiborne Rigg, FNP-BC [facility] [place], [place] [phone]   09/06/2022, 4:25 PM

## 2022-09-06 NOTE — Progress Notes (Signed)
Assessment & Plan:  Teresa Hudson was seen today for hypertension.  Diagnoses and all orders for this visit:  Essential hypertension -     amLODipine (NORVASC) 10 MG tablet; TAKE 1 TABLET (10 MG TOTAL) BY MOUTH DAILY. Continue all antihypertensives as prescribed.  Reminded to bring in blood pressure log for follow  up appointment.  RECOMMENDATIONS: DASH/Mediterranean Diets are healthier choices for HTN.    Breast cancer screening by mammogram -     MM 3D SCREENING MAMMOGRAM BILATERAL BREAST; Future  Obesity (BMI 30-39.9) -     Amb ref to Medical Nutrition Therapy-MNT  Low serum calcium -     VITAMIN D 25 Hydroxy (Vit-D Deficiency, Fractures)    Patient has been counseled on age-appropriate routine health concerns for screening and prevention. These are reviewed and up-to-date. Referrals have been placed accordingly. Immunizations are up-to-date or declined.    Subjective:   Chief Complaint  Patient presents with   Hypertension   HPI Teresa Hudson 47 y.o. female presents to office today for HTN.    HTN Blood pressure is slightly above goal today of <130/80. She is taking amlodipine 10 mg daily.   BP Readings from Last 3 Encounters:  09/06/22 130/85  06/20/22 125/86  11/05/21 134/86     DERM  Recently prescribed spironolactone by DERM. She is concerned that taking spironolactone and amlodipine will cause her blood pressure to get too low. I have instructed her to start the medication as prescribed. If blood pressure drops she can speak to [facility] about decreasing the does of spironolactone to 50 mg from 100mg    Review of Systems  Constitutional:  Negative for fever, malaise/fatigue and weight loss.  HENT: Negative.  Negative for nosebleeds.   Eyes: Negative.  Negative for blurred vision, double vision and photophobia.  Respiratory: Negative.  Negative for cough and shortness of breath.   Cardiovascular: Negative.  Negative for chest pain, palpitations and leg swelling.   Gastrointestinal: Negative.  Negative for heartburn, nausea and vomiting.  Musculoskeletal: Negative.  Negative for myalgias.  Neurological: Negative.  Negative for dizziness, focal weakness, seizures and headaches.  Psychiatric/Behavioral: Negative.  Negative for suicidal ideas.     Past Medical History:  Diagnosis Date   Anemia 1997   Chlamydia    COVID 12/2019   fever, covid pneumonia, cough, chills loss of taste and smell body sches x 2 weeks all symptoms resolved   Fibroids    GERD (gastroesophageal reflux disease)    Heavy menses many years   History of recent blood transfusion 08/2019   3 units    Hypertension 2012 dx   LSIL (low grade squamous intraepithelial lesion) on Pap smear 01/24/2012   Colpo 01/24/12   Pneumonia 12/2019   covid pneumonia    Past Surgical History:  Procedure Laterality Date   EPIDURAL BLOCK INJECTION  2012   did not work for childbirth   VAGINAL HYSTERECTOMY Bilateral 06/10/2020   Procedure: HYSTERECTOMY VAGINAL WITH bilateral SALPINGECTOMY  uterus = 586.5grams;  Surgeon: Reva Bores, MD;  Location: [facility] [place];  Service: Gynecology;  Laterality: Bilateral;   wisdom tooth removal  yrs ago    Family History  Problem Relation Age of Onset   Diabetes Mother    Breast cancer Mother 52   Cancer Father        prostate   Hypertension Father    Asthma Sister    GI problems Sister    Thyroid cancer Sister  Social History Reviewed with no changes to be made today.   Outpatient Medications Prior to Visit  Medication Sig Dispense Refill   senna-docusate (SENOKOT-S) 8.6-50 MG tablet Take 2 tablets by mouth at bedtime. 60 tablet 6   spironolactone (ALDACTONE) 100 MG tablet Take 1 tablet (100 mg total) by mouth daily. 90 tablet 1   tretinoin (RETIN-A) 0.05 % cream Apply topically at bedtime. Apply a pea sized amount topically to the face three nights a week 45 g 0   Adapalene (DIFFERIN) 0.3 % gel Apply once at bedtime.  If  irritation occurs reduce frequency of bedtime application 90 g 0   amLODipine (NORVASC) 10 MG tablet TAKE 1 TABLET (10 MG TOTAL) BY MOUTH DAILY. 90 tablet 1   No facility-administered medications prior to visit.    Allergies  Allergen Reactions   Covid-19 Mrna Vaccine [employer]) [Covid-19 Mrna Vacc (Moderna)] Swelling   Sulfa Antibiotics Anaphylaxis   Aspirin Swelling   Latex Itching and Swelling   Penicillins Swelling and Other (See Comments)    Facial swelling; occurred over 20 years ago.   Nickel Rash       Objective:    BP 130/85 (BP Location: Left Arm, Patient Position: Sitting, Cuff Size: Normal)   Pulse 100   Ht 5\' 4"  (1.626 m)   Wt 211 lb 12.8 oz (96.1 kg)   LMP 06/04/2020   SpO2 100%   BMI 36.36 kg/m  Wt Readings from Last 3 Encounters:  09/06/22 211 lb 12.8 oz (96.1 kg)  06/20/22 210 lb (95.3 kg)  11/05/21 204 lb 12.8 oz (92.9 kg)    Physical Exam Vitals and nursing note reviewed.  Constitutional:      Appearance: She is well-developed.  HENT:     Head: Normocephalic and atraumatic.  Cardiovascular:     Rate and Rhythm: Normal rate and regular rhythm.     Heart sounds: Normal heart sounds. No murmur heard.    No friction rub. No gallop.  Pulmonary:     Effort: Pulmonary effort is normal. No tachypnea or respiratory distress.     Breath sounds: Normal breath sounds. No decreased breath sounds, wheezing, rhonchi or rales.  Chest:     Chest wall: No tenderness.  Abdominal:     General: Bowel sounds are normal.     Palpations: Abdomen is soft.  Musculoskeletal:        General: Normal range of motion.     Cervical back: Normal range of motion.  Skin:    General: Skin is warm and dry.  Neurological:     Mental Status: She is alert and oriented to person, place, and time.     Coordination: Coordination normal.  Psychiatric:        Behavior: Behavior normal. Behavior is cooperative.        Thought Content: Thought content normal.        Judgment:  Judgment normal.          Patient has been counseled extensively about nutrition and exercise as well as the importance of adherence with medications and regular follow-up. The patient was given clear instructions to go to ER or return to medical center if symptoms don't improve, worsen or new problems develop. The patient verbalized understanding.   Follow-up: Return for HTN.   Claiborne Rigg, FNP-BC [facility] and Wellness [place], [place] [phone]   09/06/2022, 5:06 PM

## 2022-09-07 LAB — CMP14+EGFR
ALT: 44 IU/L — ABNORMAL HIGH (ref 0–32)
AST: 23 IU/L (ref 0–40)
Albumin/Globulin Ratio: 1.3 (ref 1.2–2.2)
Albumin: 4.4 g/dL (ref 3.9–4.9)
Alkaline Phosphatase: 80 IU/L (ref 44–121)
BUN/Creatinine Ratio: 8 — ABNORMAL LOW (ref 9–23)
BUN: 7 mg/dL (ref 6–24)
Bilirubin Total: 0.7 mg/dL (ref 0.0–1.2)
CO2: 17 mmol/L — ABNORMAL LOW (ref 20–29)
Calcium: 9.6 mg/dL (ref 8.7–10.2)
Chloride: 102 mmol/L (ref 96–106)
Creatinine, Ser: 0.86 mg/dL (ref 0.57–1.00)
Globulin, Total: 3.3 g/dL (ref 1.5–4.5)
Glucose: 93 mg/dL (ref 70–99)
Potassium: 3.8 mmol/L (ref 3.5–5.2)
Sodium: 140 mmol/L (ref 134–144)
Total Protein: 7.7 g/dL (ref 6.0–8.5)
eGFR: 85 mL/min/{1.73_m2} (ref >59)

## 2022-09-07 LAB — CBC WITH DIFFERENTIAL/PLATELET
Basophils Absolute: 0 10*3/uL (ref 0.0–0.2)
Basos: 1 %
EOS (ABSOLUTE): 0.1 10*3/uL (ref 0.0–0.4)
Eos: 2 %
Hematocrit: 47.8 % — ABNORMAL HIGH (ref 34.0–46.6)
Hemoglobin: 15.4 g/dL (ref 11.1–15.9)
Immature Grans (Abs): 0 10*3/uL (ref 0.0–0.1)
Immature Granulocytes: 0 %
Lymphocytes Absolute: 2.2 10*3/uL (ref 0.7–3.1)
Lymphs: 37 %
MCH: 27.8 pg (ref 26.6–33.0)
MCHC: 32.2 g/dL (ref 31.5–35.7)
MCV: 86 fL (ref 79–97)
Monocytes Absolute: 0.5 10*3/uL (ref 0.1–0.9)
Monocytes: 9 %
Neutrophils Absolute: 3 10*3/uL (ref 1.4–7.0)
Neutrophils: 51 %
Platelets: 331 10*3/uL (ref 150–450)
RBC: 5.53 x10E6/uL — ABNORMAL HIGH (ref 3.77–5.28)
RDW: 13.6 % (ref 11.7–15.4)
WBC: 5.9 10*3/uL (ref 3.4–10.8)

## 2022-09-07 LAB — LIPID PANEL
Chol/HDL Ratio: 3.2 ratio (ref 0.0–4.4)
Cholesterol, Total: 189 mg/dL (ref 100–199)
HDL: 59 mg/dL (ref >39)
LDL Chol Calc (NIH): 114 mg/dL — ABNORMAL HIGH (ref 0–99)
Triglycerides: 91 mg/dL (ref 0–149)
VLDL Cholesterol Cal: 16 mg/dL (ref 5–40)

## 2022-09-13 ENCOUNTER — Encounter: Payer: Self-pay | Admitting: Family Medicine

## 2022-09-13 ENCOUNTER — Other Ambulatory Visit (HOSPITAL_COMMUNITY)
Admission: RE | Admit: 2022-09-13 | Discharge: 2022-09-13 | Disposition: A | Discharge status: Home / Self Care | Payer: Medicaid Other | Source: Ambulatory Visit | Attending: Family Medicine | Admitting: Family Medicine

## 2022-09-13 ENCOUNTER — Other Ambulatory Visit: Payer: Self-pay

## 2022-09-13 ENCOUNTER — Encounter: Payer: Self-pay | Admitting: Nurse Practitioner

## 2022-09-13 ENCOUNTER — Ambulatory Visit: Payer: Medicaid Other | Admitting: Family Medicine

## 2022-09-13 VITALS — BP 134/85 | HR 88 | Wt 211.1 lb

## 2022-09-13 DIAGNOSIS — Z113 Encounter for screening for infections with a predominantly sexual mode of transmission: Secondary | ICD-10-CM

## 2022-09-13 DIAGNOSIS — Z01419 Encounter for gynecological examination (general) (routine) without abnormal findings: Secondary | ICD-10-CM

## 2022-09-13 DIAGNOSIS — N898 Other specified noninflammatory disorders of vagina: Secondary | ICD-10-CM

## 2022-09-13 NOTE — Progress Notes (Signed)
Subjective:     Teresa Hudson is a 47 y.o. female and is here for a comprehensive physical exam. The patient reports no problems.     The following portions of the patient's history were reviewed and updated as appropriate: allergies, current medications, past family history, past medical history, past social history, past surgical history, and problem list.  Review of Systems Pertinent items noted in HPI and remainder of comprehensive ROS otherwise negative.   Objective:    BP 134/85   Pulse 88   Wt 211 lb 1.6 oz (95.8 kg)   LMP 06/04/2020   BMI 36.24 kg/m  General appearance: alert, cooperative, and appears stated age Head: Normocephalic, without obvious abnormality, atraumatic Neck: no adenopathy, supple, symmetrical, trachea midline, and thyroid not enlarged, symmetric, no tenderness/mass/nodules Lungs: clear to auscultation bilaterally Heart: regular rate and rhythm, S1, S2 normal, no murmur, click, rub or gallop Abdomen: soft, non-tender; bowel sounds normal; no masses,  no organomegaly Extremities: extremities normal, atraumatic, no cyanosis or edema Pulses: 2+ and symmetric Skin: Skin color, texture, turgor normal. No rashes or lesions Lymph nodes: Cervical, supraclavicular, and axillary nodes normal.    Assessment:    Healthy female exam.      Plan:  Encounter for gynecological examination without abnormal finding  Vaginal discharge - Plan: Cervicovaginal ancillary only( [facility])  Screen for STD (sexually transmitted disease) - Plan: RPR+HBsAg+HCVAb+...  Return in 1 year (on 09/13/2023).    See After Visit Summary for Counseling Recommendations

## 2022-09-13 NOTE — Patient Instructions (Signed)

## 2022-09-14 LAB — RPR+HBSAG+HCVAB+...
HIV Screen 4th Generation wRfx: NONREACTIVE
Hep C Virus Ab: NONREACTIVE
Hepatitis B Surface Ag: NEGATIVE
RPR Ser Ql: NONREACTIVE

## 2022-09-14 LAB — CERVICOVAGINAL ANCILLARY ONLY
Bacterial Vaginitis (gardnerella): NEGATIVE
Candida Glabrata: NEGATIVE
Candida Vaginitis: NEGATIVE
Chlamydia: NEGATIVE
Comment: NEGATIVE
Comment: NEGATIVE
Comment: NEGATIVE
Comment: NEGATIVE
Comment: NEGATIVE
Comment: NORMAL
Neisseria Gonorrhea: NEGATIVE
Trichomonas: NEGATIVE

## 2022-09-21 LAB — VITAMIN D 25 HYDROXY (VIT D DEFICIENCY, FRACTURES)

## 2022-10-14 ENCOUNTER — Ambulatory Visit: Payer: Medicaid Other

## 2022-10-27 ENCOUNTER — Ambulatory Visit: Payer: Medicaid Other | Admitting: Registered"

## 2022-10-28 ENCOUNTER — Ambulatory Visit: Payer: No Typology Code available for payment source | Admitting: Dermatology

## 2022-11-25 ENCOUNTER — Ambulatory Visit: Payer: No Typology Code available for payment source | Admitting: Dermatology

## 2022-12-06 ENCOUNTER — Ambulatory Visit: Payer: Medicaid Other | Admitting: Nurse Practitioner

## 2023-02-21 ENCOUNTER — Encounter: Payer: Self-pay | Admitting: Emergency Medicine

## 2023-02-21 ENCOUNTER — Other Ambulatory Visit: Payer: Self-pay

## 2023-02-21 ENCOUNTER — Ambulatory Visit: Payer: Medicaid Other

## 2023-02-21 ENCOUNTER — Ambulatory Visit
Admission: EM | Admit: 2023-02-21 | Discharge: 2023-02-21 | Disposition: A | Discharge status: Home / Self Care | Payer: Medicaid Other | Attending: Internal Medicine | Admitting: Internal Medicine

## 2023-02-21 DIAGNOSIS — M79641 Pain in right hand: Secondary | ICD-10-CM | POA: Diagnosis not present

## 2023-02-21 DIAGNOSIS — S60221A Contusion of right hand, initial encounter: Secondary | ICD-10-CM

## 2023-02-21 NOTE — Discharge Instructions (Signed)
I will call if x-ray results are abnormal.  Elevate and apply ice.  Follow-up with hand specialist if symptoms persist or worsen.

## 2023-02-21 NOTE — ED Provider Notes (Signed)
[name]    CSN: [id number] Arrival date & time: 02/21/23  1804      History   Chief Complaint Chief Complaint  Patient presents with   Hand Pain    HPI Teresa Hudson is a 47 y.o. female.   Patient presents with right hand pain after an injury that occurred early this morning.  Patient reports that she was trying to swat a bug on the wall when she missed hitting the wall.  States that she lost her balance falling forward but did not fall completely down and did not land on her hand.  Denies numbness or tingling.  Patient has not taken anything for pain.  Reports pain is present along the MCP joints of the right hand.   Hand Pain    Past Medical History:  Diagnosis Date   Anemia 1997   Chlamydia    COVID 12/2019   fever, covid pneumonia, cough, chills loss of taste and smell body sches x 2 weeks all symptoms resolved   Fibroids    GERD (gastroesophageal reflux disease)    Heavy menses many years   History of recent blood transfusion 08/2019   3 units    Hypertension 2012 dx   LSIL (low grade squamous intraepithelial lesion) on Pap smear 01/24/2012   Colpo 01/24/12   Pneumonia 12/2019   covid pneumonia    Patient Active Problem List   Diagnosis Date Noted   Acne 06/25/2016   Vitamin D insufficiency 10/20/2014   Allergic rhinitis 10/18/2014   Fatigue 01/29/2014   Liver lesion, right lobe 01/29/2014   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   Hypertension 12/05/2012    Past Surgical History:  Procedure Laterality Date   EPIDURAL BLOCK INJECTION  2012   did not work for childbirth   VAGINAL HYSTERECTOMY Bilateral 06/10/2020   Procedure: HYSTERECTOMY VAGINAL WITH bilateral SALPINGECTOMY  uterus = 586.5grams;  Surgeon: Reva Bores, MD;  Location: [facility] [place];  Service: Gynecology;  Laterality: Bilateral;   wisdom tooth removal  yrs ago    OB History     Gravida  5   Para  3   Term  3   Preterm      AB  2   Living  3      SAB       IAB  2   Ectopic      Multiple      Live Births  3            Home Medications    Prior to Admission medications   Medication Sig Start Date End Date Taking? Authorizing Provider  amLODipine (NORVASC) 10 MG tablet TAKE 1 TABLET (10 MG TOTAL) BY MOUTH DAILY. 09/06/22 09/06/23  Claiborne Rigg, NP  senna-docusate (SENOKOT-S) 8.6-50 MG tablet Take 2 tablets by mouth at bedtime. 08/05/21   Claiborne Rigg, NP  spironolactone (ALDACTONE) 100 MG tablet Take 1 tablet (100 mg total) by mouth daily. 07/28/22   Terri Piedra, DO  tretinoin (RETIN-A) 0.05 % cream Apply topically at bedtime. Apply a pea sized amount topically to the face three nights a week 07/28/22 07/28/23  Terri Piedra, DO  albuterol (VENTOLIN HFA) 108 (90 Base) MCG/ACT inhaler Inhale 2 puffs into the lungs every 4 (four) hours as needed for wheezing or shortness of breath. 01/06/20 09/06/22  Hall-Potvin, [place], PA-C  fluticasone (FLONASE) 50 MCG/ACT nasal spray Place 1 spray into both nostrils daily. 01/06/20 09/06/22  Hall-Potvin, [place], PA-C  hydrochlorothiazide (  HYDRODIURIL) 12.5 MG tablet Take 1 tablet (12.5 mg total) by mouth daily. 06/25/16 09/06/22  Funches, Gerilyn Nestle, MD  potassium chloride SA (K-DUR,KLOR-CON) 20 MEQ tablet Take 1 tablet (20 mEq total) by mouth 2 (two) times daily. 03/03/13 09/06/22  Palumbo, April, MD    Family History Family History  Problem Relation Age of Onset   Diabetes Mother    Breast cancer Mother 58   Cancer Father        prostate   Hypertension Father    Asthma Sister    GI problems Sister    Thyroid cancer Sister    Diabetes Sister     Social History Social History   Tobacco Use   Smoking status: Never   Smokeless tobacco: Never  Vaping Use   Vaping status: Never Used  Substance Use Topics   Alcohol use: Yes    Comment: ocassionally    Drug use: No     Allergies   Covid-19 mrna vaccine (pfizer) [covid-19 mrna vacc (moderna)], Sulfa antibiotics, Aspirin,  Latex, Penicillins, and Nickel   Review of Systems Review of Systems Per HPI  Physical Exam Triage Vital Signs ED Triage Vitals  Encounter Vitals Group     BP 02/21/23 1834 124/89     Systolic BP Percentile --      Diastolic BP Percentile --      Pulse Rate 02/21/23 1834 93     Resp 02/21/23 1834 18     Temp 02/21/23 1834 98.7 F (37.1 C)     Temp Source 02/21/23 1834 Oral     SpO2 02/21/23 1834 98 %     Weight --      Height --      Head Circumference --      Peak Flow --      Pain Score 02/21/23 1831 6     Pain Loc --      Pain Education --      Exclude from Growth Chart --    No data found.  Updated Vital Signs BP 124/89 (BP Location: Left Arm)   Pulse 93   Temp 98.7 F (37.1 C) (Oral)   Resp 18   LMP 06/04/2020   SpO2 98%   Visual Acuity Right Eye Distance:   Left Eye Distance:   Bilateral Distance:    Right Eye Near:   Left Eye Near:    Bilateral Near:     Physical Exam Constitutional:      General: She is not in acute distress.    Appearance: Normal appearance. She is not toxic-appearing or diaphoretic.  HENT:     Head: Normocephalic and atraumatic.  Eyes:     Extraocular Movements: Extraocular movements intact.     Conjunctiva/sclera: Conjunctivae normal.  Pulmonary:     Effort: Pulmonary effort is normal.  Musculoskeletal:     Comments: Patient has mild tenderness to palpation throughout the 3rd through 5th MCP joints of the right hand.  Full range of motion of hand present.  Grip strength 5/5.  No lacerations, abrasions, bruising, discoloration, swelling noted.  Capillary refill and pulses intact.  Neurological:     General: No focal deficit present.     Mental Status: She is alert and oriented to person, place, and time. Mental status is at baseline.  Psychiatric:        Mood and Affect: Mood normal.        Behavior: Behavior normal.        Thought Content: Thought content normal.  Judgment: Judgment normal.      UC Treatments  / Results  Labs (all labs ordered are listed, but only abnormal results are displayed) Labs Reviewed - No data to display  EKG   Radiology DG Hand Complete Right  Result Date: 02/21/2023 CLINICAL DATA:  Hand pain EXAM: RIGHT HAND - COMPLETE 3+ VIEW COMPARISON:  None Available. FINDINGS: There is no evidence of fracture or dislocation. There is no evidence of arthropathy or other focal bone abnormality. Soft tissues are unremarkable. IMPRESSION: Negative. Electronically Signed   By: Darliss Cheney M.D.   On: 02/21/2023 21:42    Procedures Procedures (including critical care time)  Medications Ordered in UC Medications - No data to display  Initial Impression / Assessment and Plan / UC Course  I have reviewed the triage vital signs and the nursing notes.  Pertinent labs & imaging results that were available during my care of the patient were reviewed by me and considered in my medical decision making (see chart for details).     X-ray of hand was negative for any acute bony abnormality.  Suspect contusion of hand given mechanism of injury.  Advised supportive care including elevation and ice application.  Advised strict follow-up with hand specialty if symptoms persist or worsen.  Patient verbalized understanding and was agreeable with plan. Final Clinical Impressions(s) / UC Diagnoses   Final diagnoses:  Right hand pain  Contusion of right hand, initial encounter     Discharge Instructions      I will call if x-ray results are abnormal.  Elevate and apply ice.  Follow-up with hand specialist if symptoms persist or worsen.    ED Prescriptions   None    PDMP not reviewed this encounter.   Gustavus Bryant, [place] 02/22/23 [phone]

## 2023-02-21 NOTE — ED Triage Notes (Signed)
Right hand pain.   While swatting a bug, right hand hit wall and patient then fell off her bed striking the floor.  Patient did try to brace fall with right hand as well.    Right ring and middle finger is painful at base and mid way of both fingers

## 2023-04-26 ENCOUNTER — Encounter: Payer: Self-pay | Admitting: Nurse Practitioner

## 2023-04-26 ENCOUNTER — Other Ambulatory Visit: Payer: Self-pay

## 2023-04-26 DIAGNOSIS — I10 Essential (primary) hypertension: Secondary | ICD-10-CM

## 2023-04-26 MED ORDER — AMLODIPINE BESYLATE 10 MG PO TABS: ORAL_TABLET | Freq: Every day | ORAL | 1 refills | Status: AC

## 2023-06-06 ENCOUNTER — Ambulatory Visit: Payer: Medicaid Other | Admitting: Nurse Practitioner

## 2023-06-24 ENCOUNTER — Telehealth: Payer: Self-pay | Admitting: Family Medicine

## 2023-06-24 DIAGNOSIS — N390 Urinary tract infection, site not specified: Secondary | ICD-10-CM

## 2023-06-24 MED ORDER — CEPHALEXIN 500 MG PO CAPS: 500.0000 mg | ORAL_CAPSULE | Freq: Two times a day (BID) | ORAL | 0 refills | Status: AC

## 2023-06-24 NOTE — Progress Notes (Signed)

## 2023-07-08 ENCOUNTER — Ambulatory Visit: Payer: Self-pay | Admitting: Nurse Practitioner

## 2023-07-27 ENCOUNTER — Encounter: Payer: Self-pay | Admitting: Nurse Practitioner

## 2023-08-29 ENCOUNTER — Ambulatory Visit: Payer: Self-pay | Admitting: Family Medicine

## 2023-10-19 ENCOUNTER — Telehealth: Payer: Self-pay

## 2023-12-19 ENCOUNTER — Encounter: Payer: Self-pay | Admitting: Nurse Practitioner
